# Patient Record
Sex: Female | Born: 1995 | Race: White | Hispanic: No | Marital: Married | Smoking: Never smoker
Health system: Southern US, Community
[De-identification: ages and names within clinical notes are randomized; demographics above are authoritative.]

## PROBLEM LIST (undated history)

## (undated) ENCOUNTER — Inpatient Hospital Stay (HOSPITAL_COMMUNITY): Payer: Self-pay

## (undated) DIAGNOSIS — Z789 Other specified health status: Secondary | ICD-10-CM

## (undated) HISTORY — PX: WISDOM TOOTH EXTRACTION: SHX21

## (undated) HISTORY — PX: NO PAST SURGERIES: SHX2092

---

## 2014-11-15 ENCOUNTER — Ambulatory Visit (INDEPENDENT_AMBULATORY_CARE_PROVIDER_SITE_OTHER): Payer: 59 | Admitting: Family Medicine

## 2014-11-15 ENCOUNTER — Encounter: Payer: Self-pay | Admitting: Family Medicine

## 2014-11-15 VITALS — BP 117/64 | HR 76 | Ht 68.0 in | Wt 177.0 lb

## 2014-11-15 DIAGNOSIS — Z8279 Family history of other congenital malformations, deformations and chromosomal abnormalities: Secondary | ICD-10-CM

## 2014-11-15 DIAGNOSIS — Z113 Encounter for screening for infections with a predominantly sexual mode of transmission: Secondary | ICD-10-CM

## 2014-11-15 DIAGNOSIS — Z3480 Encounter for supervision of other normal pregnancy, unspecified trimester: Secondary | ICD-10-CM | POA: Insufficient documentation

## 2014-11-15 DIAGNOSIS — Z3401 Encounter for supervision of normal first pregnancy, first trimester: Secondary | ICD-10-CM

## 2014-11-15 HISTORY — DX: Family history of other congenital malformations, deformations and chromosomal abnormalities: Z82.79

## 2014-11-15 LAB — HIV ANTIBODY (ROUTINE TESTING W REFLEX): HIV 1&2 Ab, 4th Generation: NONREACTIVE

## 2014-11-15 NOTE — Progress Notes (Signed)
   Subjective:    Sheila Kirby is a G1P0 6863w4d being seen today for her first obstetrical visit.  Her obstetrical history is significant for family history of spina bifida. Patient does intend to breast feed. Pregnancy history fully reviewed.  Patient reports nausea.  Filed Vitals:   11/15/14 1008 11/15/14 1008  BP: 117/64   Pulse: 76   Height:  5\' 8"  (1.727 m)  Weight: 177 lb (80.287 kg)     HISTORY: OB History  Gravida Para Term Preterm AB SAB TAB Ectopic Multiple Living  1             # Outcome Date GA Lbr Len/2nd Weight Sex Delivery Anes PTL Lv  1 Current              History reviewed. No pertinent past medical history. Past Surgical History  Procedure Laterality Date  . No past surgeries     Family History  Problem Relation Age of Onset  . Hypertension Mother   . Diabetes Father   . Cancer Sister   . Cancer Maternal Grandfather     skin  . Cancer Paternal Grandfather     renal  . Stroke Neg Hx   . Miscarriages / Stillbirths Neg Hx   . Mental retardation Neg Hx   . Spina bifida Paternal Uncle      Exam    Uterus:     Pelvic Exam: deferred  System:     Skin: normal coloration and turgor, no rashes    Neurologic: gait normal; reflexes normal and symmetric   Extremities: normal strength, tone, and muscle mass   HEENT PERRLA and extra ocular movement intact   Mouth/Teeth mucous membranes moist, pharynx normal without lesions   Neck supple and no masses   Cardiovascular: regular rate and rhythm, no murmurs or gallops   Respiratory:  appears well, vitals normal, no respiratory distress, acyanotic, normal RR, ear and throat exam is normal, neck free of mass or lymphadenopathy, chest clear, no wheezing, crepitations, rhonchi, normal symmetric air entry   Abdomen: soft, non-tender; bowel sounds normal; no masses,  no organomegaly          Assessment:    Pregnancy: G1P0 Patient Active Problem List   Diagnosis Date Noted  . Supervision of normal first  pregnancy, antepartum 11/15/2014  . Family history of spina bifida 11/15/2014        Plan:     Initial labs drawn. Prenatal vitamins. Problem list reviewed and updated. Discussed screen due to family history of spina bifida Genetic Screening discussed Quad Screen: requested.  Ultrasound discussed; fetal survey: requested.  Follow up in 4 weeks. 50% of 30 min visit spent on counseling and coordination of care.  Water birth information given   Candelaria CelesteSTINSON, Simon Llamas JEHIEL 11/15/2014

## 2014-11-16 LAB — OBSTETRIC PANEL
Antibody Screen: NEGATIVE
Basophils Absolute: 0 10*3/uL (ref 0.0–0.1)
Basophils Relative: 0 % (ref 0–1)
Eosinophils Absolute: 0 10*3/uL (ref 0.0–0.7)
Eosinophils Relative: 0 % (ref 0–5)
HCT: 39.4 % (ref 36.0–46.0)
HEP B S AG: NEGATIVE
Hemoglobin: 13.5 g/dL (ref 12.0–15.0)
LYMPHS PCT: 19 % (ref 12–46)
Lymphs Abs: 1.3 10*3/uL (ref 0.7–4.0)
MCH: 29.2 pg (ref 26.0–34.0)
MCHC: 34.3 g/dL (ref 30.0–36.0)
MCV: 85.3 fL (ref 78.0–100.0)
MONO ABS: 0.5 10*3/uL (ref 0.1–1.0)
MONOS PCT: 7 % (ref 3–12)
MPV: 9.7 fL (ref 8.6–12.4)
NEUTROS ABS: 5.1 10*3/uL (ref 1.7–7.7)
Neutrophils Relative %: 74 % (ref 43–77)
PLATELETS: 257 10*3/uL (ref 150–400)
RBC: 4.62 MIL/uL (ref 3.87–5.11)
RDW: 12.7 % (ref 11.5–15.5)
RH TYPE: POSITIVE
Rubella: 2.38 Index — ABNORMAL HIGH (ref <0.90)
WBC: 6.9 10*3/uL (ref 4.0–10.5)

## 2014-11-16 LAB — GC/CHLAMYDIA PROBE AMP (~~LOC~~) NOT AT ARMC
Chlamydia: NEGATIVE
NEISSERIA GONORRHEA: NEGATIVE

## 2014-11-16 LAB — CULTURE, URINE COMPREHENSIVE
Colony Count: NO GROWTH
Organism ID, Bacteria: NO GROWTH

## 2014-11-19 LAB — CYSTIC FIBROSIS DIAGNOSTIC STUDY

## 2014-12-13 ENCOUNTER — Encounter: Payer: Self-pay | Admitting: Obstetrics & Gynecology

## 2014-12-20 ENCOUNTER — Ambulatory Visit (INDEPENDENT_AMBULATORY_CARE_PROVIDER_SITE_OTHER): Payer: 59 | Admitting: Obstetrics & Gynecology

## 2014-12-20 VITALS — BP 107/53 | HR 77 | Wt 177.0 lb

## 2014-12-20 DIAGNOSIS — Z23 Encounter for immunization: Secondary | ICD-10-CM | POA: Diagnosis not present

## 2014-12-20 DIAGNOSIS — Z8279 Family history of other congenital malformations, deformations and chromosomal abnormalities: Secondary | ICD-10-CM

## 2014-12-20 DIAGNOSIS — Z3402 Encounter for supervision of normal first pregnancy, second trimester: Secondary | ICD-10-CM

## 2014-12-20 NOTE — Patient Instructions (Signed)
Return to clinic for any obstetric concerns or go to MAU for evaluation  

## 2014-12-20 NOTE — Progress Notes (Signed)
Subjective:  Sheila Kirby is a 19 y.o. G1P0 at 9030w4d being seen today for ongoing prenatal care.  Sheila Kirby is currently monitored for the following issues for this low-risk pregnancy and has Supervision of normal first pregnancy, antepartum and Family history of spina bifida on her problem list.  Patient reports no complaints.   . Vag. Bleeding: None.   . Denies leaking of fluid.   The following portions of the patient's history were reviewed and updated as appropriate: allergies, current medications, past family history, past medical history, past social history, past surgical history and problem list. Problem list updated.  Objective:   Filed Vitals:   12/20/14 0924  BP: 107/53  Pulse: 77  Weight: 177 lb (80.287 kg)    Fetal Status: Fetal Heart Rate (bpm): 154         General:  Alert, oriented and cooperative. Patient is in no acute distress.  Skin: Skin is warm and dry. No rash noted.   Cardiovascular: Normal heart rate noted  Respiratory: Normal respiratory effort, no problems with respiration noted  Abdomen: Soft, gravid, appropriate for gestational age. Pain/Pressure: Absent     Pelvic: Vag. Bleeding: None Vag D/C Character: Thin   Cervical exam deferred        Extremities: Normal range of motion.  Edema: None  Mental Status: Normal mood and affect. Normal behavior. Normal judgment and thought content.   Urinalysis: Urine Protein: Negative Urine Glucose: Negative  Assessment and Plan:  Pregnancy: G1P0 at 5030w4d  1. Supervision of normal first pregnancy, antepartum, second trimester 2. Family history of spina bifida Declines quad screen today, informed Sheila Kirby has up to 8761w6d to do this screen if desired. Anatomy scan ordered - US MFM OB COMP + 14 WK; Future  3. Flu vaccine need - Flu Vaccine QUAD 36+ mos IM (Fluarix, Quad PF)  Routine obstetric precautions reviewed. Please refer to After Visit Summary for other counseling recommendations.  Return in about 4 weeks (around  01/17/2015) for OB Visit.   Tereso NewcomerUgonna A Burr Soffer, MD

## 2015-01-02 NOTE — L&D Delivery Note (Signed)
Patient is 20 y.o. G1P0 6390w0d admitted for IOL for PD   Delivery Note At 10:53 AM a viable female was delivered via Vaginal, Spontaneous Delivery (Presentation: ; Occiput Anterior).  APGAR: 9, 9; weight  pending.   Placenta status: Intact, Spontaneous.  Cord: 3 vessels with the following complications: single knot.  Cord pH: n/a  Anesthesia: Epidural  Episiotomy: None Lacerations: Vaginal (shallow) without perineal involvment Suture Repair: 3.0 vicryl (figure of 8) Est. Blood Loss (mL): 250  Mom to postpartum.  Baby to Couplet care / Skin to Skin.  Almon Herculesaye T Winnie Barsky 06/23/2015, 11:52 AM

## 2015-01-17 ENCOUNTER — Other Ambulatory Visit: Payer: Self-pay | Admitting: Obstetrics & Gynecology

## 2015-01-17 ENCOUNTER — Ambulatory Visit (HOSPITAL_COMMUNITY)
Admission: RE | Admit: 2015-01-17 | Discharge: 2015-01-17 | Disposition: A | Discharge status: Home / Self Care | Payer: 59 | Source: Ambulatory Visit | Attending: Obstetrics & Gynecology | Admitting: Obstetrics & Gynecology

## 2015-01-17 DIAGNOSIS — Z3689 Encounter for other specified antenatal screening: Secondary | ICD-10-CM

## 2015-01-17 DIAGNOSIS — Z3A18 18 weeks gestation of pregnancy: Secondary | ICD-10-CM

## 2015-01-17 DIAGNOSIS — Z36 Encounter for antenatal screening of mother: Secondary | ICD-10-CM | POA: Diagnosis not present

## 2015-01-17 DIAGNOSIS — Z3402 Encounter for supervision of normal first pregnancy, second trimester: Secondary | ICD-10-CM

## 2015-01-17 DIAGNOSIS — Z8279 Family history of other congenital malformations, deformations and chromosomal abnormalities: Secondary | ICD-10-CM | POA: Diagnosis not present

## 2015-01-19 ENCOUNTER — Ambulatory Visit (INDEPENDENT_AMBULATORY_CARE_PROVIDER_SITE_OTHER): Payer: 59 | Admitting: Family Medicine

## 2015-01-19 VITALS — BP 109/63 | HR 75 | Wt 176.0 lb

## 2015-01-19 DIAGNOSIS — Z3402 Encounter for supervision of normal first pregnancy, second trimester: Secondary | ICD-10-CM

## 2015-01-19 NOTE — Patient Instructions (Signed)

## 2015-01-19 NOTE — Progress Notes (Signed)
Subjective:  Sheila Kirby is a 20 y.o. G1P0 at [redacted]w[redacted]d being seen today for ongoing prenatal care.  She is currently monitored for the following issues for this low-risk pregnancy and has Supervision of normal first pregnancy, antepartum and Family history of spina bifida on her problem list.  Patient reports no complaints.  Contractions: Not present. Vag. Bleeding: None.  Movement: Present. Denies leaking of fluid.   The following portions of the patient's history were reviewed and updated as appropriate: allergies, current medications, past family history, past medical history, past social history, past surgical history and problem list. Problem list updated.  Objective:   Filed Vitals:   01/19/15 1323  BP: 109/63  Pulse: 75  Weight: 176 lb (79.833 kg)    Fetal Status: Fetal Heart Rate (bpm): 141   Movement: Present     General:  Alert, oriented and cooperative. Patient is in no acute distress.  Skin: Skin is warm and dry. No rash noted.   Cardiovascular: Normal heart rate noted  Respiratory: Normal respiratory effort, no problems with respiration noted  Abdomen: Soft, gravid, appropriate for gestational age. Pain/Pressure: Absent     Pelvic: Vag. Bleeding: None Vag D/C Character: Thin   Cervical exam deferred        Extremities: Normal range of motion.  Edema: None  Mental Status: Normal mood and affect. Normal behavior. Normal judgment and thought content.   Urinalysis: Urine Protein: Negative Urine Glucose: Negative  Assessment and Plan:  Pregnancy: G1P0 at [redacted]w[redacted]d  1. Supervision of normal first pregnancy, antepartum, second trimester [place] appears normal.  No evidence of neural tube defects.  HR, FH normal.  Preterm labor symptoms and general obstetric precautions including but not limited to vaginal bleeding, contractions, leaking of fluid and fetal movement were reviewed in detail with the patient. Please refer to After Visit Summary for other counseling recommendations.   Return in about 4 weeks (around 02/16/2015) for OB.   [name], DO

## 2015-02-24 ENCOUNTER — Ambulatory Visit (INDEPENDENT_AMBULATORY_CARE_PROVIDER_SITE_OTHER): Payer: 59 | Admitting: Family Medicine

## 2015-02-24 VITALS — BP 118/60 | HR 80 | Wt 180.0 lb

## 2015-02-24 DIAGNOSIS — Z3402 Encounter for supervision of normal first pregnancy, second trimester: Secondary | ICD-10-CM

## 2015-02-24 NOTE — Progress Notes (Signed)
Subjective:  Sheila Kirby is a 20 y.o. G1P0 at [redacted]w[redacted]d being seen today for ongoing prenatal care.  She is currently monitored for the following issues for this low-risk pregnancy and has Supervision of normal first pregnancy, antepartum and Family history of spina bifida on her problem list.  Patient reports no complaints.  Contractions: Not present. Vag. Bleeding: None.  Movement: Present. Denies leaking of fluid.   The following portions of the patient's history were reviewed and updated as appropriate: allergies, current medications, past family history, past medical history, past social history, past surgical history and problem list. Problem list updated.  Objective:   Filed Vitals:   02/24/15 0842  BP: 118/60  Pulse: 80  Weight: 180 lb (81.647 kg)    Fetal Status:     Movement: Present     General:  Alert, oriented and cooperative. Patient is in no acute distress.  Skin: Skin is warm and dry. No rash noted.   Cardiovascular: Normal heart rate noted  Respiratory: Normal respiratory effort, no problems with respiration noted  Abdomen: Soft, gravid, appropriate for gestational age. Pain/Pressure: Absent     Pelvic: Vag. Bleeding: None Vag D/C Character: Thin   Cervical exam deferred        Extremities: Normal range of motion.  Edema: None  Mental Status: Normal mood and affect. Normal behavior. Normal judgment and thought content.   Urinalysis:      Assessment and Plan:  Pregnancy: G1P0 at [redacted]w[redacted]d  1. Supervision of normal first pregnancy, antepartum, second trimester FHT normal, FH normal.  28 week labs next visit  Preterm labor symptoms and general obstetric precautions including but not limited to vaginal bleeding, contractions, leaking of fluid and fetal movement were reviewed in detail with the patient. Please refer to After Visit Summary for other counseling recommendations.  Return in about 4 weeks (around 03/24/2015) for OB f/u.   [name], DO

## 2015-02-24 NOTE — Patient Instructions (Signed)

## 2015-03-31 ENCOUNTER — Encounter: Payer: Self-pay | Admitting: Obstetrics & Gynecology

## 2015-03-31 ENCOUNTER — Ambulatory Visit (INDEPENDENT_AMBULATORY_CARE_PROVIDER_SITE_OTHER): Payer: 59 | Admitting: Obstetrics & Gynecology

## 2015-03-31 VITALS — BP 126/77 | HR 65 | Wt 186.0 lb

## 2015-03-31 DIAGNOSIS — Z23 Encounter for immunization: Secondary | ICD-10-CM

## 2015-03-31 DIAGNOSIS — Z3490 Encounter for supervision of normal pregnancy, unspecified, unspecified trimester: Secondary | ICD-10-CM

## 2015-03-31 DIAGNOSIS — Z36 Encounter for antenatal screening of mother: Secondary | ICD-10-CM | POA: Diagnosis not present

## 2015-03-31 DIAGNOSIS — Z3480 Encounter for supervision of other normal pregnancy, unspecified trimester: Secondary | ICD-10-CM | POA: Diagnosis not present

## 2015-03-31 DIAGNOSIS — Z3403 Encounter for supervision of normal first pregnancy, third trimester: Secondary | ICD-10-CM

## 2015-03-31 LAB — CBC
HCT: 34.4 % — ABNORMAL LOW (ref 36.0–46.0)
Hemoglobin: 11.6 g/dL — ABNORMAL LOW (ref 12.0–15.0)
MCH: 29.5 pg (ref 26.0–34.0)
MCHC: 33.7 g/dL (ref 30.0–36.0)
MCV: 87.5 fL (ref 78.0–100.0)
MPV: 9.9 fL (ref 8.6–12.4)
Platelets: 213 10*3/uL (ref 150–400)
RBC: 3.93 MIL/uL (ref 3.87–5.11)
RDW: 13 % (ref 11.5–15.5)
WBC: 8.8 10*3/uL (ref 4.0–10.5)

## 2015-03-31 NOTE — Progress Notes (Signed)
Subjective:  Sheila HonourMacie Kirby is a 20 y.o. G1P0 (daughter) at 4576w0d being seen today for ongoing prenatal care.  She is currently monitored for the following issues for this low-risk pregnancy and has Supervision of normal first pregnancy, antepartum and Family history of spina bifida on her problem list.  Patient reports no complaints.  Contractions: Not present. Vag. Bleeding: None.  Movement: Present. Denies leaking of fluid.   The following portions of the patient's history were reviewed and updated as appropriate: allergies, current medications, past family history, past medical history, past social history, past surgical history and problem list. Problem list updated.  Objective:   Filed Vitals:   03/31/15 0910  BP: 126/77  Pulse: 65  Weight: 186 lb (84.369 kg)    Fetal Status: Fetal Heart Rate (bpm): 138   Movement: Present     General:  Alert, oriented and cooperative. Patient is in no acute distress.  Skin: Skin is warm and dry. No rash noted.   Cardiovascular: Normal heart rate noted  Respiratory: Normal respiratory effort, no problems with respiration noted  Abdomen: Soft, gravid, appropriate for gestational age. Pain/Pressure: Absent     Pelvic: Vag. Bleeding: None Vag D/C Character: Thin   Cervical exam deferred        Extremities: Normal range of motion.  Edema: None  Mental Status: Normal mood and affect. Normal behavior. Normal judgment and thought content.   Urinalysis: Urine Protein: Negative Urine Glucose: Negative  Assessment and Plan:  Pregnancy: G1P0 at 6776w0d  1. Prenatal care, unspecified trimester  - CBC - Glucose Tolerance, 1 HR (50g) - RPR - HIV antibody (with reflex)  Preterm labor symptoms and general obstetric precautions including but not limited to vaginal bleeding, contractions, leaking of fluid and fetal movement were reviewed in detail with the patient. Please refer to After Visit Summary for other counseling recommendations.  Return in about 3  weeks (around 04/21/2015).   Allie BossierMyra C Olina Melfi, MD

## 2015-04-01 LAB — HIV ANTIBODY (ROUTINE TESTING W REFLEX): HIV: NONREACTIVE

## 2015-04-01 LAB — GLUCOSE TOLERANCE, 1 HOUR (50G) W/O FASTING: GLUCOSE, 1 HR, GESTATIONAL: 100 mg/dL (ref <140)

## 2015-04-01 LAB — RPR

## 2015-04-20 ENCOUNTER — Ambulatory Visit (INDEPENDENT_AMBULATORY_CARE_PROVIDER_SITE_OTHER): Payer: 59 | Admitting: Family Medicine

## 2015-04-20 VITALS — BP 116/53 | HR 79 | Wt 187.0 lb

## 2015-04-20 DIAGNOSIS — Z3403 Encounter for supervision of normal first pregnancy, third trimester: Secondary | ICD-10-CM

## 2015-04-20 NOTE — Patient Instructions (Signed)

## 2015-04-20 NOTE — Progress Notes (Signed)
Subjective:  Sheila Kirby is a 20 y.o. G1P0 at 5768w6d being seen today for ongoing prenatal care.  She is currently monitored for the following issues for this low-risk pregnancy and has Supervision of normal first pregnancy, antepartum and Family history of spina bifida on her problem list.  Patient reports no complaints.  Contractions: Not present. Vag. Bleeding: None.  Movement: Present. Denies leaking of fluid.   The following portions of the patient's history were reviewed and updated as appropriate: allergies, current medications, past family history, past medical history, past social history, past surgical history and problem list. Problem list updated.  Objective:   Filed Vitals:   04/20/15 1505  BP: 116/53  Pulse: 79  Weight: 187 lb (84.823 kg)    Fetal Status: Fetal Heart Rate (bpm): 130   Movement: Present     General:  Alert, oriented and cooperative. Patient is in no acute distress.  Skin: Skin is warm and dry. No rash noted.   Cardiovascular: Normal heart rate noted  Respiratory: Normal respiratory effort, no problems with respiration noted  Abdomen: Soft, gravid, appropriate for gestational age. Pain/Pressure: Absent     Pelvic: Vag. Bleeding: None Vag D/C Character: Thin   Cervical exam deferred        Extremities: Normal range of motion.  Edema: None  Mental Status: Normal mood and affect. Normal behavior. Normal judgment and thought content.   Urinalysis:      Assessment and Plan:  Pregnancy: G1P0 at 7268w6d  1. Supervision of normal first pregnancy, antepartum, third trimester FHT and FH normal  There are no diagnoses linked to this encounter. Preterm labor symptoms and general obstetric precautions including but not limited to vaginal bleeding, contractions, leaking of fluid and fetal movement were reviewed in detail with the patient. Please refer to After Visit Summary for other counseling recommendations.  Return in about 2 weeks (around 05/04/2015) for OB  f/u.   Levie HeritageJacob J Lorie Melichar, DO

## 2015-05-05 ENCOUNTER — Ambulatory Visit (INDEPENDENT_AMBULATORY_CARE_PROVIDER_SITE_OTHER): Payer: 59 | Admitting: Family Medicine

## 2015-05-05 VITALS — BP 113/62 | HR 78 | Wt 189.0 lb

## 2015-05-05 DIAGNOSIS — Z3403 Encounter for supervision of normal first pregnancy, third trimester: Secondary | ICD-10-CM

## 2015-05-05 NOTE — Patient Instructions (Signed)

## 2015-05-05 NOTE — Progress Notes (Signed)
Subjective:  Sheila Kirby is a 20 y.o. G1P0 at 335w0d being seen today for ongoing prenatal care.  She is currently monitored for the following issues for this low-risk pregnancy and has Supervision of normal first pregnancy, antepartum and Family history of spina bifida on her problem list.  Patient reports heartburn controlled by Zantac.  Contractions: Not present. Vag. Bleeding: None.  Movement: Present. Denies leaking of fluid.   The following portions of the patient's history were reviewed and updated as appropriate: allergies, current medications, past family history, past medical history, past social history, past surgical history and problem list. Problem list updated.  Objective:   Filed Vitals:   05/05/15 0845  BP: 113/62  Pulse: 78  Weight: 189 lb (85.73 kg)    Fetal Status: Fetal Heart Rate (bpm): 138   Movement: Present     General:  Alert, oriented and cooperative. Patient is in no acute distress.  Skin: Skin is warm and dry. No rash noted.   Cardiovascular: Normal heart rate noted  Respiratory: Normal respiratory effort, no problems with respiration noted  Abdomen: Soft, gravid, appropriate for gestational age. Pain/Pressure: Absent     Pelvic: Vag. Bleeding: None Vag D/C Character: Thin   Cervical exam deferred        Extremities: Normal range of motion.  Edema: None  Mental Status: Normal mood and affect. Normal behavior. Normal judgment and thought content.   Urinalysis: Urine Protein: Negative Urine Glucose: Negative  Assessment and Plan:  Pregnancy: G1P0 at 355w0d  1. Supervision of normal first pregnancy, antepartum, third trimester FHT and FH normal  Preterm labor symptoms and general obstetric precautions including but not limited to vaginal bleeding, contractions, leaking of fluid and fetal movement were reviewed in detail with the patient. Please refer to After Visit Summary for other counseling recommendations.  Return in about 2 weeks (around 05/19/2015)  for OB f/u.   Levie HeritageJacob J Stinson, DO

## 2015-05-14 ENCOUNTER — Inpatient Hospital Stay (HOSPITAL_COMMUNITY)
Admission: AD | Admit: 2015-05-14 | Discharge: 2015-05-14 | Disposition: A | Discharge status: Home / Self Care | Payer: 59 | Source: Ambulatory Visit | Attending: Obstetrics & Gynecology | Admitting: Obstetrics & Gynecology

## 2015-05-14 ENCOUNTER — Encounter (HOSPITAL_COMMUNITY): Payer: Self-pay | Admitting: Certified Nurse Midwife

## 2015-05-14 DIAGNOSIS — O9989 Other specified diseases and conditions complicating pregnancy, childbirth and the puerperium: Secondary | ICD-10-CM | POA: Diagnosis not present

## 2015-05-14 DIAGNOSIS — Z880 Allergy status to penicillin: Secondary | ICD-10-CM | POA: Insufficient documentation

## 2015-05-14 DIAGNOSIS — O4703 False labor before 37 completed weeks of gestation, third trimester: Secondary | ICD-10-CM | POA: Diagnosis not present

## 2015-05-14 DIAGNOSIS — Z3A35 35 weeks gestation of pregnancy: Secondary | ICD-10-CM | POA: Insufficient documentation

## 2015-05-14 DIAGNOSIS — R109 Unspecified abdominal pain: Secondary | ICD-10-CM | POA: Diagnosis not present

## 2015-05-14 DIAGNOSIS — O26899 Other specified pregnancy related conditions, unspecified trimester: Secondary | ICD-10-CM

## 2015-05-14 DIAGNOSIS — O26893 Other specified pregnancy related conditions, third trimester: Secondary | ICD-10-CM | POA: Insufficient documentation

## 2015-05-14 HISTORY — DX: Other specified health status: Z78.9

## 2015-05-14 LAB — URINALYSIS, ROUTINE W REFLEX MICROSCOPIC
Bilirubin Urine: NEGATIVE
Glucose, UA: NEGATIVE mg/dL
Hgb urine dipstick: NEGATIVE
Ketones, ur: NEGATIVE mg/dL
LEUKOCYTES UA: NEGATIVE
Nitrite: NEGATIVE
PH: 6.5 (ref 5.0–8.0)
Protein, ur: NEGATIVE mg/dL
SPECIFIC GRAVITY, URINE: 1.015 (ref 1.005–1.030)

## 2015-05-14 LAB — WET PREP, GENITAL
CLUE CELLS WET PREP: NONE SEEN
SPERM: NONE SEEN
TRICH WET PREP: NONE SEEN
Yeast Wet Prep HPF POC: NONE SEEN

## 2015-05-14 NOTE — MAU Provider Note (Signed)
History  Chief Complaint:  Contractions  Sheila Kirby is a 20 y.o. G1P0 female at 7388w2d presenting w/ report of frequent uc's beginning last night, stronger and closer than her normal braxton hicks. Kept her up until 0500. Went to work this am, uc's have spaced, and lessened in intensity, but still wanted to get checked out.   Reports active fetal movement, contractions: irregular now, vaginal bleeding: none, membranes: intact. Has had more discharge, different than her normal, although denies odor/itching/irritation.   [facility]  Next visit Monday. Pregnancy complicated by nothing  Obstetrical History: OB History    Gravida Para Term Preterm AB TAB SAB Ectopic Multiple Living   1               Past Medical History: Past Medical History  Diagnosis Date  . Medical history non-contributory     Past Surgical History: Past Surgical History  Procedure Laterality Date  . No past surgeries      Social History: Social History   Social History  . Marital Status: Single    Spouse Name: N/A  . Number of Children: N/A  . Years of Education: N/A   Social History Main Topics  . Smoking status: Never Smoker   . Smokeless tobacco: None  . Alcohol Use: No  . Drug Use: No  . Sexual Activity: Yes   Other Topics Concern  . None   Social History Narrative    Allergies: Allergies  Allergen Reactions  . Amoxicillin Rash    No prescriptions prior to admission    Review of Systems  Pertinent pos/neg as indicated in HPI  Physical Exam  Blood pressure 123/74, pulse 104, temperature 97.8 F (36.6 C), temperature source Oral, resp. rate 20, height 5\' 9"  (1.753 m), weight 85.821 kg (189 lb 3.2 oz). General appearance: alert, cooperative and no distress Lungs: clear to auscultation bilaterally, normal effort Heart: regular rate and rhythm Abdomen: gravid, soft, non-tender Extremities: trace edema DTR's 2+  Spec exam: cx visually closed, scant amt thin white  nonodorous d/c Cultures/Specimens: wet prep, gc/ct  SVE: 1.5/50/-2, vtx Fetal monitoring: FHR: 145 bpm, variability: moderate,  Accelerations: Present,  decelerations:  Absent Uterine activity: none  MAU Course  UA NST Spec exam w/ cultures SVE  Labs:  Results for orders placed or performed during the hospital encounter of 05/14/15 (from the past 24 hour(s))  Urinalysis, Routine w reflex microscopic (not at [facility])     Status: None   Collection Time: 05/14/15  1:44 PM  Result Value Ref Range   Color, Urine YELLOW YELLOW   APPearance CLEAR CLEAR   Specific Gravity, Urine 1.015 1.005 - 1.030   pH 6.5 5.0 - 8.0   Glucose, UA NEGATIVE NEGATIVE mg/dL   Hgb urine dipstick NEGATIVE NEGATIVE   Bilirubin Urine NEGATIVE NEGATIVE   Ketones, ur NEGATIVE NEGATIVE mg/dL   Protein, ur NEGATIVE NEGATIVE mg/dL   Nitrite NEGATIVE NEGATIVE   Leukocytes, UA NEGATIVE NEGATIVE  Wet prep, genital     Status: Abnormal   Collection Time: 05/14/15  2:22 PM  Result Value Ref Range   Yeast Wet Prep HPF POC NONE SEEN NONE SEEN   Trich, Wet Prep NONE SEEN NONE SEEN   Clue Cells Wet Prep HPF POC NONE SEEN NONE SEEN   WBC, Wet Prep HPF POC MANY (A) NONE SEEN   Sperm NONE SEEN     Imaging:  n/a  Assessment and Plan  A:  3288w2d SIUP  G1P0  Cramping during pregnancy  Cat 1 FHR P:  D/C home  Increase po fluids  GC/CT pending  Reviewed ptl s/s, fkc  Keep next appt at [facility] clinic on Mon as scheduled   Marge Duncans CNM,WHNP-BC 5/13/20173:09 PM

## 2015-05-14 NOTE — Discharge Instructions (Signed)
Fetal Movement Counts °Patient Name: __________________________________________________ Patient Due Date: ____________________ °Performing a fetal movement count is highly recommended in high-risk pregnancies, but it is good for every pregnant woman to do. Your health care provider may ask you to start counting fetal movements at 28 weeks of the pregnancy. Fetal movements often increase: °· After eating a full meal. °· After physical activity. °· After eating or drinking something sweet or cold. °· At rest. °Pay attention to when you feel the baby is most active. This will help you notice a pattern of your baby's sleep and wake cycles and what factors contribute to an increase in fetal movement. It is important to perform a fetal movement count at the same time each day when your baby is normally most active.  °HOW TO COUNT FETAL MOVEMENTS °1. Find a quiet and comfortable area to sit or lie down on your left side. Lying on your left side provides the best blood and oxygen circulation to your baby. °2. Write down the day and time on a sheet of paper or in a journal. °3. Start counting kicks, flutters, swishes, rolls, or jabs in a 2-hour period. You should feel at least 10 movements within 2 hours. °4. If you do not feel 10 movements in 2 hours, wait 2-3 hours and count again. Look for a change in the pattern or not enough counts in 2 hours. °SEEK MEDICAL CARE IF: °· You feel less than 10 counts in 2 hours, tried twice. °· There is no movement in over an hour. °· The pattern is changing or taking longer each day to reach 10 counts in 2 hours. °· You feel the baby is not moving as he or she usually does. °Date: ____________ Movements: ____________ Start time: ____________ Finish time: ____________  °Date: ____________ Movements: ____________ Start time: ____________ Finish time: ____________ °Date: ____________ Movements: ____________ Start time: ____________ Finish time: ____________ °Date: ____________ Movements:  ____________ Start time: ____________ Finish time: ____________ °Date: ____________ Movements: ____________ Start time: ____________ Finish time: ____________ °Date: ____________ Movements: ____________ Start time: ____________ Finish time: ____________ °Date: ____________ Movements: ____________ Start time: ____________ Finish time: ____________ °Date: ____________ Movements: ____________ Start time: ____________ Finish time: ____________  °Date: ____________ Movements: ____________ Start time: ____________ Finish time: ____________ °Date: ____________ Movements: ____________ Start time: ____________ Finish time: ____________ °Date: ____________ Movements: ____________ Start time: ____________ Finish time: ____________ °Date: ____________ Movements: ____________ Start time: ____________ Finish time: ____________ °Date: ____________ Movements: ____________ Start time: ____________ Finish time: ____________ °Date: ____________ Movements: ____________ Start time: ____________ Finish time: ____________ °Date: ____________ Movements: ____________ Start time: ____________ Finish time: ____________  °Date: ____________ Movements: ____________ Start time: ____________ Finish time: ____________ °Date: ____________ Movements: ____________ Start time: ____________ Finish time: ____________ °Date: ____________ Movements: ____________ Start time: ____________ Finish time: ____________ °Date: ____________ Movements: ____________ Start time: ____________ Finish time: ____________ °Date: ____________ Movements: ____________ Start time: ____________ Finish time: ____________ °Date: ____________ Movements: ____________ Start time: ____________ Finish time: ____________ °Date: ____________ Movements: ____________ Start time: ____________ Finish time: ____________  °Date: ____________ Movements: ____________ Start time: ____________ Finish time: ____________ °Date: ____________ Movements: ____________ Start time: ____________ Finish  time: ____________ °Date: ____________ Movements: ____________ Start time: ____________ Finish time: ____________ °Date: ____________ Movements: ____________ Start time: ____________ Finish time: ____________ °Date: ____________ Movements: ____________ Start time: ____________ Finish time: ____________ °Date: ____________ Movements: ____________ Start time: ____________ Finish time: ____________ °Date: ____________ Movements: ____________ Start time: ____________ Finish time: ____________  °Date: ____________ Movements: ____________ Start time: ____________ Finish   time: ____________ °Date: ____________ Movements: ____________ Start time: ____________ Finish time: ____________ °Date: ____________ Movements: ____________ Start time: ____________ Finish time: ____________ °Date: ____________ Movements: ____________ Start time: ____________ Finish time: ____________ °Date: ____________ Movements: ____________ Start time: ____________ Finish time: ____________ °Date: ____________ Movements: ____________ Start time: ____________ Finish time: ____________ °Date: ____________ Movements: ____________ Start time: ____________ Finish time: ____________  °Date: ____________ Movements: ____________ Start time: ____________ Finish time: ____________ °Date: ____________ Movements: ____________ Start time: ____________ Finish time: ____________ °Date: ____________ Movements: ____________ Start time: ____________ Finish time: ____________ °Date: ____________ Movements: ____________ Start time: ____________ Finish time: ____________ °Date: ____________ Movements: ____________ Start time: ____________ Finish time: ____________ °Date: ____________ Movements: ____________ Start time: ____________ Finish time: ____________ °Date: ____________ Movements: ____________ Start time: ____________ Finish time: ____________  °Date: ____________ Movements: ____________ Start time: ____________ Finish time: ____________ °Date: ____________  Movements: ____________ Start time: ____________ Finish time: ____________ °Date: ____________ Movements: ____________ Start time: ____________ Finish time: ____________ °Date: ____________ Movements: ____________ Start time: ____________ Finish time: ____________ °Date: ____________ Movements: ____________ Start time: ____________ Finish time: ____________ °Date: ____________ Movements: ____________ Start time: ____________ Finish time: ____________ °Date: ____________ Movements: ____________ Start time: ____________ Finish time: ____________  °Date: ____________ Movements: ____________ Start time: ____________ Finish time: ____________ °Date: ____________ Movements: ____________ Start time: ____________ Finish time: ____________ °Date: ____________ Movements: ____________ Start time: ____________ Finish time: ____________ °Date: ____________ Movements: ____________ Start time: ____________ Finish time: ____________ °Date: ____________ Movements: ____________ Start time: ____________ Finish time: ____________ °Date: ____________ Movements: ____________ Start time: ____________ Finish time: ____________ °  °This information is not intended to replace advice given to you by your health care provider. Make sure you discuss any questions you have with your health care provider. °  °Document Released: 01/17/2006 Document Revised: 01/08/2014 Document Reviewed: 10/15/2011 °Elsevier Interactive Patient Education ©2016 [employer] °Vaginal Delivery °During delivery, your health care provider will help you give birth to your baby. During a vaginal delivery, you will work to push the baby out of your vagina. However, before you can push your baby out, a few things need to happen. The opening of your uterus (cervix) has to soften, thin out, and open up (dilate) all the way to 10 cm. Also, your baby has to move down from the uterus into your vagina.  °SIGNS OF LABOR  °Your health care provider will first need to make sure you  are in labor. Signs of labor include:  °· Passing what is called the mucous plug before labor begins. This is a small amount of blood-stained mucus. °· Having regular, painful uterine contractions.   °· The time between contractions gets shorter.   °· The discomfort and pain gradually get more intense. °· Contraction pains get worse when walking and do not go away when resting.   °· Your cervix becomes thinner (effacement) and dilates. °BEFORE THE DELIVERY °Once you are in labor and admitted into the hospital or care center, your health care provider may do the following:  °5. Perform a complete physical exam. °6. Review any complications related to pregnancy or labor.  °7. Check your blood pressure, pulse, temperature, and heart rate (vital signs).   °8. Determine if, and when, the rupture of amniotic membranes occurred. °9. Do a vaginal exam (using a sterile glove and lubricant) to determine:   °1. The position (presentation) of the baby. Is the baby's head presenting first (vertex) in the birth canal (vagina), or are the feet or buttocks first (breech)?   °2. The level (station) of the baby's head within   the birth canal.   °3. The effacement and dilatation of the cervix.   °10. An electronic fetal monitor is usually placed on your abdomen when you first arrive. This is used to monitor your contractions and the baby's heart rate. °1. When the monitor is on your abdomen (external fetal monitor), it can only pick up the frequency and length of your contractions. It cannot tell the strength of your contractions. °2. If it becomes necessary for your health care provider to know exactly how strong your contractions are or to see exactly what the baby's heart rate is doing, an internal monitor may be inserted into your vagina and uterus. Your health care provider will discuss the benefits and risks of using an internal monitor and obtain your permission before inserting the device. °3. Continuous fetal monitoring may be  needed if you have an epidural, are receiving certain medicines (such as oxytocin), or have pregnancy or labor complications. °11. An IV access tube may be placed into a vein in your arm to deliver fluids and medicines if necessary. °THREE STAGES OF LABOR AND DELIVERY °Normal labor and delivery is divided into three stages. °First Stage °This stage starts when you begin to contract regularly and your cervix begins to efface and dilate. It ends when your cervix is completely open (fully dilated). The first stage is the longest stage of labor and can last from 3 hours to 15 hours.  °Several methods are available to help with labor pain. You and your health care provider will decide which option is best for you. Options include:  °· Opioid medicines. These are strong pain medicines that you can get through your IV tube or as a shot into your muscle. These medicines lessen pain but do not make it go away completely.  °· Epidural. A medicine is given through a thin tube that is inserted in your back. The medicine numbs the lower part of your body and prevents any pain in that area. °· Paracervical pain medicine. This is an injection of an anesthetic on each side of your cervix.   °· You may request natural childbirth, which does not involve the use of pain medicines or an epidural during labor and delivery. Instead, you will use other things, such as breathing exercises, to help cope with the pain. °Second Stage °The second stage of labor begins when your cervix is fully dilated at 10 cm. It continues until you push your baby down through the birth canal and the baby is born. This stage can take only minutes or several hours. °· The location of your baby's head as it moves through the birth canal is reported as a number called a station. If the baby's head has not started its descent, the station is described as being at minus 3 (-3). When your baby's head is at the zero station, it is at the middle of the birth canal  and is engaged in the pelvis. The station of your baby helps indicate the progress of the second stage of labor. °· When your baby is born, your health care provider may hold the baby with his or her head lowered to prevent amniotic fluid, mucus, and blood from getting into the baby's lungs. The baby's mouth and nose may be suctioned with a small bulb syringe to remove any additional fluid. °· Your health care provider may then place the baby on your stomach. It is important to keep the baby from getting cold. To do this, the health care provider will dry   the baby off, place the baby directly on your skin (with no blankets between you and the baby), and cover the baby with warm, dry blankets.   °· The umbilical cord is cut. °Third Stage °During the third stage of labor, your health care provider will deliver the placenta (afterbirth) and make sure your bleeding is under control. The delivery of the placenta usually takes about 5 minutes but can take up to 30 minutes. After the placenta is delivered, a medicine may be given either by IV or injection to help contract the uterus and control bleeding. If you are planning to breastfeed, you can try to do so now. °After you deliver the placenta, your uterus should contract and get very firm. If your uterus does not remain firm, your health care provider will massage it. This is important because the contraction of the uterus helps cut off bleeding at the site where the placenta was attached to your uterus. If your uterus does not contract properly and stay firm, you may continue to bleed heavily. If there is a lot of bleeding, medicines may be given to contract the uterus and stop the bleeding.  °  °This information is not intended to replace advice given to you by your health care provider. Make sure you discuss any questions you have with your health care provider. °  °Document Released: 09/27/2007 Document Revised: 01/08/2014 Document Reviewed: 08/15/2011 °Elsevier  Interactive Patient Education ©2016 [employer] ° °

## 2015-05-14 NOTE — MAU Note (Signed)
Pt states she had contractions from midnight to 5AM. She was finally able to fall asleep at [facility] but today the ctxs returned and are painful. Pt denies LOF or vaginal bleeding but states there is an increase in vaginal discharge. Pt states the baby hasn't been moving much today.

## 2015-05-16 ENCOUNTER — Ambulatory Visit (INDEPENDENT_AMBULATORY_CARE_PROVIDER_SITE_OTHER): Payer: 59 | Admitting: Obstetrics & Gynecology

## 2015-05-16 ENCOUNTER — Encounter (HOSPITAL_COMMUNITY): Payer: Self-pay

## 2015-05-16 VITALS — BP 101/57 | HR 88 | Wt 189.0 lb

## 2015-05-16 DIAGNOSIS — Z36 Encounter for antenatal screening of mother: Secondary | ICD-10-CM

## 2015-05-16 DIAGNOSIS — Z3400 Encounter for supervision of normal first pregnancy, unspecified trimester: Secondary | ICD-10-CM | POA: Diagnosis not present

## 2015-05-16 DIAGNOSIS — Z880 Allergy status to penicillin: Secondary | ICD-10-CM

## 2015-05-16 DIAGNOSIS — Z113 Encounter for screening for infections with a predominantly sexual mode of transmission: Secondary | ICD-10-CM | POA: Diagnosis not present

## 2015-05-16 DIAGNOSIS — Z3403 Encounter for supervision of normal first pregnancy, third trimester: Secondary | ICD-10-CM | POA: Diagnosis not present

## 2015-05-16 LAB — OB RESULTS CONSOLE GC/CHLAMYDIA: GC PROBE AMP, GENITAL: NEGATIVE

## 2015-05-16 LAB — OB RESULTS CONSOLE GBS: GBS: POSITIVE

## 2015-05-16 NOTE — Progress Notes (Signed)
Subjective:  Sheila Kirby is a 20 y.o. G1P0 at 5418w4d being seen today for ongoing prenatal care.  Sheila Kirby is currently monitored for the following issues for this low-risk pregnancy and has Supervision of normal first pregnancy, antepartum and Family history of spina bifida on her problem list.  Patient reports occasional contractions. Was seen in MAU on 05/14/15 and ruled out for PTL.  Contractions: Irregular. Vag. Bleeding: None.  Movement: Present. Denies leaking of fluid.   The following portions of the patient's history were reviewed and updated as appropriate: allergies, current medications, past family history, past medical history, past social history, past surgical history and problem list. Problem list updated.  Objective:   Filed Vitals:   05/16/15 0912  BP: 101/57  Pulse: 88  Weight: 189 lb (85.73 kg)    Fetal Status: Fetal Heart Rate (bpm): 129 Fundal Height: 36 cm Movement: Present  Presentation: Vertex  General:  Alert, oriented and cooperative. Patient is in no acute distress.  Skin: Skin is warm and dry. No rash noted.   Cardiovascular: Normal heart rate noted  Respiratory: Normal respiratory effort, no problems with respiration noted  Abdomen: Soft, gravid, appropriate for gestational age. Pain/Pressure: Present     Pelvic: Vag. Bleeding: None Vag D/C Character: Thin  Cervical exam performed Dilation: 1.5 Effacement (%): 50 Station: -2  Pelvic cultures done  Extremities: Normal range of motion.  Edema: None  Mental Status: Normal mood and affect. Normal behavior. Normal judgment and thought content.   Urinalysis: Urine Protein: Negative Urine Glucose: Negative  Assessment and Plan:  Pregnancy: G1P0 at 4218w4d  Supervision of normal first pregnancy, antepartum, third trimester - Culture, Grp B Strep w/Rflx Suscept due to PCN allergy - GC[facility]Chlamydia [facility] Will follow up results and manage accordingly.  Preterm labor symptoms and general  obstetric precautions including but not limited to vaginal bleeding, contractions, leaking of fluid and fetal movement were reviewed in detail with the patient. Please refer to After Visit Summary for other counseling recommendations.  Return in about 1 week (around 05/23/2015) for OB Visit.   Tereso NewcomerUgonna A Telly Jawad, MD

## 2015-05-17 LAB — GC/CHLAMYDIA PROBE AMP (~~LOC~~) NOT AT ARMC
CHLAMYDIA, DNA PROBE: NEGATIVE
NEISSERIA GONORRHEA: NEGATIVE

## 2015-05-21 ENCOUNTER — Encounter: Payer: Self-pay | Admitting: Obstetrics & Gynecology

## 2015-05-21 DIAGNOSIS — O99820 Streptococcus B carrier state complicating pregnancy: Secondary | ICD-10-CM | POA: Insufficient documentation

## 2015-05-21 LAB — CULTURE, STREPTOCOCCUS GRP B W/SUSCEPT

## 2015-05-25 ENCOUNTER — Ambulatory Visit (INDEPENDENT_AMBULATORY_CARE_PROVIDER_SITE_OTHER): Payer: 59 | Admitting: Family Medicine

## 2015-05-25 VITALS — BP 115/69 | HR 102 | Wt 192.0 lb

## 2015-05-25 DIAGNOSIS — Z3403 Encounter for supervision of normal first pregnancy, third trimester: Secondary | ICD-10-CM

## 2015-05-25 NOTE — Progress Notes (Signed)
Subjective:  Sheila Kirby is a 20 y.o. G1P0 at 4974w6d being seen today for ongoing prenatal care.  She is currently monitored for the following issues for this low-risk pregnancy and has Supervision of normal first pregnancy, antepartum; Family history of spina bifida; and Group B Streptococcus carrier, +RV culture, currently pregnant on her problem list.  Patient reports no complaints.  Contractions: Irregular. Vag. Bleeding: None.  Movement: Present. Denies leaking of fluid.   The following portions of the patient's history were reviewed and updated as appropriate: allergies, current medications, past family history, past medical history, past social history, past surgical history and problem list. Problem list updated.  Objective:   Filed Vitals:   05/25/15 1449  BP: 115/69  Pulse: 102  Weight: 192 lb (87.091 kg)    Fetal Status: Fetal Heart Rate (bpm): 152   Movement: Present     General:  Alert, oriented and cooperative. Patient is in no acute distress.  Skin: Skin is warm and dry. No rash noted.   Cardiovascular: Normal heart rate noted  Respiratory: Normal respiratory effort, no problems with respiration noted  Abdomen: Soft, gravid, appropriate for gestational age. Pain/Pressure: Present     Pelvic: Vag. Bleeding: None Vag D/C Character: Thin   Cervical exam deferred        Extremities: Normal range of motion.  Edema: None  Mental Status: Normal mood and affect. Normal behavior. Normal judgment and thought content.   Urinalysis: Urine Protein: Negative Urine Glucose: Negative  Assessment and Plan:  Pregnancy: G1P0 at 6974w6d  1. Supervision of normal first pregnancy, antepartum, third trimester FHT and FH normal  Term labor symptoms and general obstetric precautions including but not limited to vaginal bleeding, contractions, leaking of fluid and fetal movement were reviewed in detail with the patient. Please refer to After Visit Summary for other counseling  recommendations.  Return in about 1 week (around 06/01/2015).   Levie HeritageJacob J Michae Grimley, DO

## 2015-06-01 ENCOUNTER — Ambulatory Visit (INDEPENDENT_AMBULATORY_CARE_PROVIDER_SITE_OTHER): Payer: 59 | Admitting: Family Medicine

## 2015-06-01 VITALS — BP 133/67 | HR 82 | Wt 188.0 lb

## 2015-06-01 DIAGNOSIS — O99820 Streptococcus B carrier state complicating pregnancy: Secondary | ICD-10-CM

## 2015-06-01 DIAGNOSIS — Z22330 Carrier of Group B streptococcus: Secondary | ICD-10-CM

## 2015-06-01 DIAGNOSIS — Z3403 Encounter for supervision of normal first pregnancy, third trimester: Secondary | ICD-10-CM

## 2015-06-01 NOTE — Progress Notes (Signed)
Subjective:  Sheila Kirby is a 20 y.o. G1P0 at 1666w6d being seen today for ongoing prenatal care.  She is currently monitored for the following issues for this low-risk pregnancy and has Supervision of normal first pregnancy, antepartum; Family history of spina bifida; and Group B Streptococcus carrier, +RV culture, currently pregnant on her problem list.  Patient reports no complaints.  Contractions: Irregular. Vag. Bleeding: None.  Movement: Present. Denies leaking of fluid.   The following portions of the patient's history were reviewed and updated as appropriate: allergies, current medications, past family history, past medical history, past social history, past surgical history and problem list. Problem list updated.  Objective:   Filed Vitals:   06/01/15 1517  BP: 133/67  Pulse: 82  Weight: 188 lb (85.276 kg)    Fetal Status: Fetal Heart Rate (bpm): 147   Movement: Present     General:  Alert, oriented and cooperative. Patient is in no acute distress.  Skin: Skin is warm and dry. No rash noted.   Cardiovascular: Normal heart rate noted  Respiratory: Normal respiratory effort, no problems with respiration noted  Abdomen: Soft, gravid, appropriate for gestational age. Pain/Pressure: Present     Pelvic: Vag. Bleeding: None Vag D/C Character: Thin   Cervical exam performed Dilation: 1.5 Effacement (%): 50 Station: -2  Extremities: Normal range of motion.  Edema: None  Mental Status: Normal mood and affect. Normal behavior. Normal judgment and thought content.   Urinalysis: Urine Protein: Negative Urine Glucose: Negative  Assessment and Plan:  Pregnancy: G1P0 at 8766w6d  1. Supervision of normal first pregnancy, antepartum, third trimester FH, FHT normal.  Discussed contraception - IUD vs Depo.  2. Group B Streptococcus carrier, +RV culture, currently pregnant Ancef during labor  Preterm labor symptoms and general obstetric precautions including but not limited to vaginal  bleeding, contractions, leaking of fluid and fetal movement were reviewed in detail with the patient. Please refer to After Visit Summary for other counseling recommendations.  Return in about 1 week (around 06/08/2015) for OB f/u.   Levie HeritageJacob J Donavan Kerlin, DO

## 2015-06-01 NOTE — Patient Instructions (Signed)
Medroxyprogesterone injection [Contraceptive] What is this medicine? MEDROXYPROGESTERONE (me DROX ee proe JES te rone) contraceptive injections prevent pregnancy. They provide effective birth control for 3 months. Depo-subQ Provera 104 is also used for treating pain related to endometriosis. This medicine may be used for other purposes; ask your health care provider or pharmacist if you have questions. What should I tell my health care provider before I take this medicine? They need to know if you have any of these conditions: -frequently drink alcohol -asthma -blood vessel disease or a history of a blood clot in the lungs or legs -bone disease such as osteoporosis -breast cancer -diabetes -eating disorder (anorexia nervosa or bulimia) -high blood pressure -HIV infection or AIDS -kidney disease -liver disease -mental depression -migraine -seizures (convulsions) -stroke -tobacco smoker -vaginal bleeding -an unusual or allergic reaction to medroxyprogesterone, other hormones, medicines, foods, dyes, or preservatives -pregnant or trying to get pregnant -breast-feeding How should I use this medicine? Depo-Provera Contraceptive injection is given into a muscle. Depo-subQ Provera 104 injection is given under the skin. These injections are given by a health care professional. You must not be pregnant before getting an injection. The injection is usually given during the first 5 days after the start of a menstrual period or 6 weeks after delivery of a baby. Talk to your pediatrician regarding the use of this medicine in children. Special care may be needed. These injections have been used in female children who have started having menstrual periods. Overdosage: If you think you have taken too much of this medicine contact a poison control center or emergency room at once. NOTE: This medicine is only for you. Do not share this medicine with others. What if I miss a dose? Try not to miss a  dose. You must get an injection once every 3 months to maintain birth control. If you cannot keep an appointment, call and reschedule it. If you wait longer than 13 weeks between Depo-Provera contraceptive injections or longer than 14 weeks between Depo-subQ Provera 104 injections, you could get pregnant. Use another method for birth control if you miss your appointment. You may also need a pregnancy test before receiving another injection. What may interact with this medicine? Do not take this medicine with any of the following medications: -bosentan This medicine may also interact with the following medications: -aminoglutethimide -antibiotics or medicines for infections, especially rifampin, rifabutin, rifapentine, and griseofulvin -aprepitant -barbiturate medicines such as phenobarbital or primidone -bexarotene -carbamazepine -medicines for seizures like ethotoin, felbamate, oxcarbazepine, phenytoin, topiramate -modafinil -St. John's wort This list may not describe all possible interactions. Give your health care provider a list of all the medicines, herbs, non-prescription drugs, or dietary supplements you use. Also tell them if you smoke, drink alcohol, or use illegal drugs. Some items may interact with your medicine. What should I watch for while using this medicine? This drug does not protect you against HIV infection (AIDS) or other sexually transmitted diseases. Use of this product may cause you to lose calcium from your bones. Loss of calcium may cause weak bones (osteoporosis). Only use this product for more than 2 years if other forms of birth control are not right for you. The longer you use this product for birth control the more likely you will be at risk for weak bones. Ask your health care professional how you can keep strong bones. You may have a change in bleeding pattern or irregular periods. Many females stop having periods while taking this drug. If you have   received your  injections on time, your chance of being pregnant is very low. If you think you may be pregnant, see your health care professional as soon as possible. Tell your health care professional if you want to get pregnant within the next year. The effect of this medicine may last a long time after you get your last injection. What side effects may I notice from receiving this medicine? Side effects that you should report to your doctor or health care professional as soon as possible: -allergic reactions like skin rash, itching or hives, swelling of the face, lips, or tongue -breast tenderness or discharge -breathing problems -changes in vision -depression -feeling faint or lightheaded, falls -fever -pain in the abdomen, chest, groin, or leg -problems with balance, talking, walking -unusually weak or tired -yellowing of the eyes or skin Side effects that usually do not require medical attention (report to your doctor or health care professional if they continue or are bothersome): -acne -fluid retention and swelling -headache -irregular periods, spotting, or absent periods -temporary pain, itching, or skin reaction at site where injected -weight gain This list may not describe all possible side effects. Call your doctor for medical advice about side effects. You may report side effects to FDA at 1-800-FDA-1088. Where should I keep my medicine? This does not apply. The injection will be given to you by a health care professional. NOTE: This sheet is a summary. It may not cover all possible information. If you have questions about this medicine, talk to your doctor, pharmacist, or health care provider.    2016, Elsevier/Gold Standard. (2008-01-09 18:37:56)  Levonorgestrel intrauterine device (IUD) What is this medicine? LEVONORGESTREL IUD (LEE voe nor jes trel) is a contraceptive (birth control) device. The device is placed inside the uterus by a healthcare professional. It is used to prevent  pregnancy and can also be used to treat heavy bleeding that occurs during your period. Depending on the device, it can be used for 3 to 5 years. This medicine may be used for other purposes; ask your health care provider or pharmacist if you have questions. What should I tell my health care provider before I take this medicine? They need to know if you have any of these conditions: -abnormal Pap smear -cancer of the breast, uterus, or cervix -diabetes -endometritis -genital or pelvic infection now or in the past -have more than one sexual partner or your partner has more than one partner -heart disease -history of an ectopic or tubal pregnancy -immune system problems -IUD in place -liver disease or tumor -problems with blood clots or take blood-thinners -use intravenous drugs -uterus of unusual shape -vaginal bleeding that has not been explained -an unusual or allergic reaction to levonorgestrel, other hormones, silicone, or polyethylene, medicines, foods, dyes, or preservatives -pregnant or trying to get pregnant -breast-feeding How should I use this medicine? This device is placed inside the uterus by a health care professional. Talk to your pediatrician regarding the use of this medicine in children. Special care may be needed. Overdosage: If you think you have taken too much of this medicine contact a poison control center or emergency room at once. NOTE: This medicine is only for you. Do not share this medicine with others. What if I miss a dose? This does not apply. What may interact with this medicine? Do not take this medicine with any of the following medications: -amprenavir -bosentan -fosamprenavir This medicine may also interact with the following medications: -aprepitant -barbiturate medicines for inducing sleep   or treating seizures -bexarotene -griseofulvin -medicines to treat seizures like carbamazepine, ethotoin, felbamate, oxcarbazepine, phenytoin,  topiramate -modafinil -pioglitazone -rifabutin -rifampin -rifapentine -some medicines to treat HIV infection like atazanavir, indinavir, lopinavir, nelfinavir, tipranavir, ritonavir -St. John's wort -warfarin This list may not describe all possible interactions. Give your health care provider a list of all the medicines, herbs, non-prescription drugs, or dietary supplements you use. Also tell them if you smoke, drink alcohol, or use illegal drugs. Some items may interact with your medicine. What should I watch for while using this medicine? Visit your doctor or health care professional for regular check ups. See your doctor if you or your partner has sexual contact with others, becomes HIV positive, or gets a sexual transmitted disease. This product does not protect you against HIV infection (AIDS) or other sexually transmitted diseases. You can check the placement of the IUD yourself by reaching up to the top of your vagina with clean fingers to feel the threads. Do not pull on the threads. It is a good habit to check placement after each menstrual period. Call your doctor right away if you feel more of the IUD than just the threads or if you cannot feel the threads at all. The IUD may come out by itself. You may become pregnant if the device comes out. If you notice that the IUD has come out use a backup birth control method like condoms and call your health care provider. Using tampons will not change the position of the IUD and are okay to use during your period. What side effects may I notice from receiving this medicine? Side effects that you should report to your doctor or health care professional as soon as possible: -allergic reactions like skin rash, itching or hives, swelling of the face, lips, or tongue -fever, flu-like symptoms -genital sores -high blood pressure -no menstrual period for 6 weeks during use -pain, swelling, warmth in the leg -pelvic pain or tenderness -severe or  sudden headache -signs of pregnancy -stomach cramping -sudden shortness of breath -trouble with balance, talking, or walking -unusual vaginal bleeding, discharge -yellowing of the eyes or skin Side effects that usually do not require medical attention (report to your doctor or health care professional if they continue or are bothersome): -acne -breast pain -change in sex drive or performance -changes in weight -cramping, dizziness, or faintness while the device is being inserted -headache -irregular menstrual bleeding within first 3 to 6 months of use -nausea This list may not describe all possible side effects. Call your doctor for medical advice about side effects. You may report side effects to FDA at 1-800-FDA-1088. Where should I keep my medicine? This does not apply. NOTE: This sheet is a summary. It may not cover all possible information. If you have questions about this medicine, talk to your doctor, pharmacist, or health care provider.    2016, Elsevier/Gold Standard. (2011-01-18 13:54:04)  

## 2015-06-08 ENCOUNTER — Ambulatory Visit (INDEPENDENT_AMBULATORY_CARE_PROVIDER_SITE_OTHER): Payer: 59 | Admitting: Family Medicine

## 2015-06-08 VITALS — BP 124/72 | HR 72 | Wt 189.0 lb

## 2015-06-08 DIAGNOSIS — Z3403 Encounter for supervision of normal first pregnancy, third trimester: Secondary | ICD-10-CM

## 2015-06-08 NOTE — Progress Notes (Signed)
Subjective:  Sheila Kirby is a 20 y.o. G1P0 at 7211w6d being seen today for ongoing prenatal care.  She is currently monitored for the following issues for this low-risk pregnancy and has Supervision of normal first pregnancy, antepartum; Family history of spina bifida; and Group B Streptococcus carrier, +RV culture, currently pregnant on her problem list.  Patient reports no complaints.  Contractions: Irregular. Vag. Bleeding: None.  Movement: Present. Denies leaking of fluid.   The following portions of the patient's history were reviewed and updated as appropriate: allergies, current medications, past family history, past medical history, past social history, past surgical history and problem list. Problem list updated.  Objective:   Filed Vitals:   06/08/15 1446  Weight: 189 lb (85.73 kg)    Fetal Status:     Movement: Present     General:  Alert, oriented and cooperative. Patient is in no acute distress.  Skin: Skin is warm and dry. No rash noted.   Cardiovascular: Normal heart rate noted  Respiratory: Normal respiratory effort, no problems with respiration noted  Abdomen: Soft, gravid, appropriate for gestational age. Pain/Pressure: Present     Pelvic: Vag. Bleeding: None Vag D/C Character: Thin   Cervical exam performed        Extremities: Normal range of motion.  Edema: None  Mental Status: Normal mood and affect. Normal behavior. Normal judgment and thought content.   Urinalysis:      Assessment and Plan:  Pregnancy: G1P0 at 5011w6d  1. Supervision of normal first pregnancy, antepartum, third trimester FHT and FH normal   Term labor symptoms and general obstetric precautions including but not limited to vaginal bleeding, contractions, leaking of fluid and fetal movement were reviewed in detail with the patient. Please refer to After Visit Summary for other counseling recommendations.  No Follow-up on file.   Levie HeritageJacob J Stinson, DO

## 2015-06-13 ENCOUNTER — Ambulatory Visit (INDEPENDENT_AMBULATORY_CARE_PROVIDER_SITE_OTHER): Payer: 59 | Admitting: Family Medicine

## 2015-06-13 VITALS — BP 117/73 | HR 92 | Wt 192.0 lb

## 2015-06-13 DIAGNOSIS — Z3403 Encounter for supervision of normal first pregnancy, third trimester: Secondary | ICD-10-CM

## 2015-06-13 NOTE — Progress Notes (Signed)
Subjective:  Sheila Kirby is a 20 y.o. G1P0 at 9267w4d being seen today for ongoing prenatal care.  She is currently monitored for the following issues for this low-risk pregnancy and has Supervision of normal first pregnancy, antepartum; Family history of spina bifida; and Group B Streptococcus carrier, +RV culture, currently pregnant on her problem list.  Had some cramping and bloody show after last cervical check.  Patient reports no complaints.  Contractions: Irregular. Vag. Bleeding: None.  Movement: Present. Denies leaking of fluid.   The following portions of the patient's history were reviewed and updated as appropriate: allergies, current medications, past family history, past medical history, past social history, past surgical history and problem list. Problem list updated.  Objective:   Filed Vitals:   06/13/15 1047  BP: 117/73  Pulse: 92  Weight: 192 lb (87.091 kg)    Fetal Status: Fetal Heart Rate (bpm): 135   Movement: Present  Presentation: Vertex  General:  Alert, oriented and cooperative. Patient is in no acute distress.  Skin: Skin is warm and dry. No rash noted.   Cardiovascular: Normal heart rate noted  Respiratory: Normal respiratory effort, no problems with respiration noted  Abdomen: Soft, gravid, appropriate for gestational age. Pain/Pressure: Present     Pelvic: Cervical exam performed Dilation: 2 Effacement (%): 50 Station: -2  Extremities: Normal range of motion.  Edema: None  Mental Status: Normal mood and affect. Normal behavior. Normal judgment and thought content.   Urinalysis: Urine Protein: Negative Urine Glucose: Negative  Assessment and Plan:  Pregnancy: G1P0 at 6967w4d  1. Supervision of normal first pregnancy, antepartum, third trimester Follow up 1 week.  Will schedule induction around 41 weeks if no labor.  Term labor symptoms and general obstetric precautions including but not limited to vaginal bleeding, contractions, leaking of fluid and  fetal movement were reviewed in detail with the patient. Please refer to After Visit Summary for other counseling recommendations.  No Follow-up on file.   Levie HeritageJacob J Stinson, DO

## 2015-06-20 ENCOUNTER — Encounter (HOSPITAL_COMMUNITY): Payer: Self-pay | Admitting: General Practice

## 2015-06-20 ENCOUNTER — Telehealth (HOSPITAL_COMMUNITY): Payer: Self-pay | Admitting: General Practice

## 2015-06-20 ENCOUNTER — Ambulatory Visit (INDEPENDENT_AMBULATORY_CARE_PROVIDER_SITE_OTHER): Payer: 59 | Admitting: Obstetrics & Gynecology

## 2015-06-20 VITALS — BP 103/59 | HR 99 | Wt 194.0 lb

## 2015-06-20 DIAGNOSIS — O480 Post-term pregnancy: Secondary | ICD-10-CM | POA: Diagnosis not present

## 2015-06-20 DIAGNOSIS — Z3403 Encounter for supervision of normal first pregnancy, third trimester: Secondary | ICD-10-CM

## 2015-06-20 NOTE — Progress Notes (Signed)
Subjective:  Sheila Kirby is a 20 y.o. G1P0 at 159w4d being seen today for ongoing prenatal care.  She is currently monitored for the following issues for this low-risk pregnancy and has Supervision of normal first pregnancy, antepartum; Family history of spina bifida; Group B Streptococcus carrier, +RV culture, currently pregnant; and Post term pregnancy over 40 weeks on her problem list.  Patient reports occasional contractions.  Contractions: Irregular. Vag. Bleeding: None.  Movement: Present. Denies leaking of fluid.   The following portions of the patient's history were reviewed and updated as appropriate: allergies, current medications, past family history, past medical history, past social history, past surgical history and problem list. Problem list updated.  Objective:   Filed Vitals:   06/20/15 1009  BP: 103/59  Pulse: 99  Weight: 194 lb (87.998 kg)    Fetal Status: Fetal Heart Rate (bpm): NST Fundal Height: 39 cm Movement: Present     General:  Alert, oriented and cooperative. Patient is in no acute distress.  Skin: Skin is warm and dry. No rash noted.   Cardiovascular: Normal heart rate noted  Respiratory: Normal respiratory effort, no problems with respiration noted  Abdomen: Soft, gravid, appropriate for gestational age. Pain/Pressure: Present     Pelvic: Cervical exam deferred        Extremities: Normal range of motion.  Edema: None  Mental Status: Normal mood and affect. Normal behavior. Normal judgment and thought content.   Urinalysis: Urine Protein: Negative Urine Glucose: Negative NST performed today was reviewed and was found to be reactive.  AFI was also normal.  Continue recommended antenatal testing and prenatal care.  Assessment and Plan:  Pregnancy: G1P0 at 1059w4d  1. Post term pregnancy over 40 weeks Normal postdates testing today. IOL scheduled for 41 weeks.  2. Supervision of normal first pregnancy, antepartum, third trimester Term labor symptoms and  general obstetric precautions including but not limited to vaginal bleeding, contractions, leaking of fluid and fetal movement were reviewed in detail with the patient. Please refer to After Visit Summary for other counseling recommendations.  Return in about 6 weeks (around 08/01/2015) for Postpartum check.   Tereso NewcomerUgonna A Jeanpaul Biehl, MD

## 2015-06-20 NOTE — Patient Instructions (Signed)
Return to clinic for any scheduled appointments or obstetric concerns, or go to [facility] for evaluation  

## 2015-06-23 ENCOUNTER — Encounter (HOSPITAL_COMMUNITY): Payer: Self-pay

## 2015-06-23 ENCOUNTER — Inpatient Hospital Stay (HOSPITAL_COMMUNITY): Payer: 59 | Admitting: Anesthesiology

## 2015-06-23 ENCOUNTER — Inpatient Hospital Stay (HOSPITAL_COMMUNITY)
Admission: RE | Admit: 2015-06-23 | Discharge: 2015-06-25 | DRG: 775 | Disposition: A | Discharge status: Home / Self Care | Payer: 59 | Source: Ambulatory Visit | Attending: Obstetrics & Gynecology | Admitting: Obstetrics & Gynecology

## 2015-06-23 VITALS — BP 107/70 | HR 69 | Temp 97.3°F | Resp 18 | Ht 68.0 in | Wt 194.0 lb

## 2015-06-23 DIAGNOSIS — O99824 Streptococcus B carrier state complicating childbirth: Secondary | ICD-10-CM | POA: Diagnosis present

## 2015-06-23 DIAGNOSIS — K219 Gastro-esophageal reflux disease without esophagitis: Secondary | ICD-10-CM | POA: Diagnosis present

## 2015-06-23 DIAGNOSIS — Z8249 Family history of ischemic heart disease and other diseases of the circulatory system: Secondary | ICD-10-CM

## 2015-06-23 DIAGNOSIS — Z3A41 41 weeks gestation of pregnancy: Secondary | ICD-10-CM | POA: Diagnosis not present

## 2015-06-23 DIAGNOSIS — Z833 Family history of diabetes mellitus: Secondary | ICD-10-CM | POA: Diagnosis not present

## 2015-06-23 DIAGNOSIS — O9962 Diseases of the digestive system complicating childbirth: Secondary | ICD-10-CM | POA: Diagnosis present

## 2015-06-23 DIAGNOSIS — O480 Post-term pregnancy: Secondary | ICD-10-CM | POA: Diagnosis not present

## 2015-06-23 DIAGNOSIS — O99820 Streptococcus B carrier state complicating pregnancy: Secondary | ICD-10-CM

## 2015-06-23 LAB — CBC
HEMATOCRIT: 34.2 % — AB (ref 36.0–46.0)
Hemoglobin: 11.8 g/dL — ABNORMAL LOW (ref 12.0–15.0)
MCH: 28.9 pg (ref 26.0–34.0)
MCHC: 34.5 g/dL (ref 30.0–36.0)
MCV: 83.8 fL (ref 78.0–100.0)
PLATELETS: 193 10*3/uL (ref 150–400)
RBC: 4.08 MIL/uL (ref 3.87–5.11)
RDW: 12.7 % (ref 11.5–15.5)
WBC: 10.7 10*3/uL — ABNORMAL HIGH (ref 4.0–10.5)

## 2015-06-23 LAB — TYPE AND SCREEN
ABO/RH(D): O POS
Antibody Screen: NEGATIVE

## 2015-06-23 LAB — ABO/RH: ABO/RH(D): O POS

## 2015-06-23 LAB — RPR: RPR: NONREACTIVE

## 2015-06-23 MED ORDER — FENTANYL 2.5 MCG/ML BUPIVACAINE 1/10 % EPIDURAL INFUSION (WH - ANES): 14.0000 mL/h | INTRAMUSCULAR | Status: DC | PRN

## 2015-06-23 MED ORDER — FENTANYL CITRATE (PF) 100 MCG/2ML IJ SOLN
100.0000 ug | INTRAMUSCULAR | Status: DC | PRN
  Administered 2015-06-23: 100 ug via INTRAVENOUS
  Filled 2015-06-23: qty 2

## 2015-06-23 MED ORDER — ACETAMINOPHEN 325 MG PO TABS
650.0000 mg | ORAL_TABLET | ORAL | Status: DC | PRN
Start: 2015-06-23 — End: 2015-06-25

## 2015-06-23 MED ORDER — COCONUT OIL OIL
1.0000 "application " | TOPICAL_OIL | Status: DC | PRN
  Administered 2015-06-24: 1 via TOPICAL
  Filled 2015-06-23: qty 120

## 2015-06-23 MED ORDER — SENNOSIDES-DOCUSATE SODIUM 8.6-50 MG PO TABS
2.0000 | ORAL_TABLET | ORAL | Status: DC
  Administered 2015-06-23: 2 via ORAL
  Filled 2015-06-23 (×2): qty 2

## 2015-06-23 MED ORDER — WITCH HAZEL-GLYCERIN EX PADS: 1.0000 "application " | MEDICATED_PAD | CUTANEOUS | Status: DC | PRN

## 2015-06-23 MED ORDER — CEFAZOLIN IN D5W 1 GM/50ML IV SOLN
1.0000 g | Freq: Three times a day (TID) | INTRAVENOUS | Status: DC
  Filled 2015-06-23 (×2): qty 50

## 2015-06-23 MED ORDER — LIDOCAINE HCL (PF) 1 % IJ SOLN
30.0000 mL | INTRAMUSCULAR | Status: DC | PRN
  Filled 2015-06-23: qty 30

## 2015-06-23 MED ORDER — DIBUCAINE 1 % RE OINT: 1.0000 "application " | TOPICAL_OINTMENT | RECTAL | Status: DC | PRN

## 2015-06-23 MED ORDER — PRENATAL MULTIVITAMIN CH
1.0000 | ORAL_TABLET | Freq: Every day | ORAL | Status: DC
  Administered 2015-06-24 – 2015-06-25 (×2): 1 via ORAL
  Filled 2015-06-23 (×2): qty 1

## 2015-06-23 MED ORDER — OXYTOCIN 40 UNITS IN LACTATED RINGERS INFUSION - SIMPLE MED
2.5000 [IU]/h | INTRAVENOUS | Status: DC
  Filled 2015-06-23: qty 1000

## 2015-06-23 MED ORDER — LACTATED RINGERS IV SOLN
INTRAVENOUS | Status: DC
  Administered 2015-06-23 (×2): via INTRAVENOUS

## 2015-06-23 MED ORDER — LACTATED RINGERS IV SOLN: INTRAVENOUS | Status: DC

## 2015-06-23 MED ORDER — TERBUTALINE SULFATE 1 MG/ML IJ SOLN
0.2500 mg | Freq: Once | INTRAMUSCULAR | Status: DC | PRN
  Filled 2015-06-23: qty 1

## 2015-06-23 MED ORDER — ZOLPIDEM TARTRATE 5 MG PO TABS: 5.0000 mg | ORAL_TABLET | Freq: Every evening | ORAL | Status: DC | PRN

## 2015-06-23 MED ORDER — OXYTOCIN BOLUS FROM INFUSION
500.0000 mL | INTRAVENOUS | Status: DC
  Administered 2015-06-23: 500 mL via INTRAVENOUS

## 2015-06-23 MED ORDER — ONDANSETRON HCL 4 MG PO TABS: 4.0000 mg | ORAL_TABLET | ORAL | Status: DC | PRN

## 2015-06-23 MED ORDER — EPHEDRINE 5 MG/ML INJ
10.0000 mg | INTRAVENOUS | Status: DC | PRN
  Filled 2015-06-23: qty 2

## 2015-06-23 MED ORDER — OXYCODONE-ACETAMINOPHEN 5-325 MG PO TABS: 1.0000 | ORAL_TABLET | ORAL | Status: DC | PRN

## 2015-06-23 MED ORDER — CEFAZOLIN SODIUM-DEXTROSE 2-4 GM/100ML-% IV SOLN
2.0000 g | Freq: Once | INTRAVENOUS | Status: AC
  Administered 2015-06-23: 2 g via INTRAVENOUS

## 2015-06-23 MED ORDER — PHENYLEPHRINE 40 MCG/ML (10ML) SYRINGE FOR IV PUSH (FOR BLOOD PRESSURE SUPPORT)
80.0000 ug | PREFILLED_SYRINGE | INTRAVENOUS | Status: DC | PRN
  Filled 2015-06-23: qty 5

## 2015-06-23 MED ORDER — LACTATED RINGERS IV SOLN: 500.0000 mL | Freq: Once | INTRAVENOUS | Status: DC

## 2015-06-23 MED ORDER — MISOPROSTOL 25 MCG QUARTER TABLET
25.0000 ug | ORAL_TABLET | ORAL | Status: DC | PRN
  Administered 2015-06-23: 25 ug via VAGINAL
  Filled 2015-06-23: qty 1
  Filled 2015-06-23: qty 0.25

## 2015-06-23 MED ORDER — SOD CITRATE-CITRIC ACID 500-334 MG/5ML PO SOLN: 30.0000 mL | ORAL | Status: DC | PRN

## 2015-06-23 MED ORDER — IBUPROFEN 600 MG PO TABS
600.0000 mg | ORAL_TABLET | Freq: Four times a day (QID) | ORAL | Status: DC
  Administered 2015-06-23 – 2015-06-25 (×8): 600 mg via ORAL
  Filled 2015-06-23 (×8): qty 1

## 2015-06-23 MED ORDER — BENZOCAINE-MENTHOL 20-0.5 % EX AERO: 1.0000 "application " | INHALATION_SPRAY | CUTANEOUS | Status: DC | PRN

## 2015-06-23 MED ORDER — PHENYLEPHRINE 40 MCG/ML (10ML) SYRINGE FOR IV PUSH (FOR BLOOD PRESSURE SUPPORT)
PREFILLED_SYRINGE | INTRAVENOUS | Status: AC
  Filled 2015-06-23: qty 20

## 2015-06-23 MED ORDER — ONDANSETRON HCL 4 MG/2ML IJ SOLN
4.0000 mg | Freq: Four times a day (QID) | INTRAMUSCULAR | Status: DC | PRN
Start: 2015-06-23 — End: 2015-06-23
  Administered 2015-06-23: 4 mg via INTRAVENOUS
  Filled 2015-06-23: qty 2

## 2015-06-23 MED ORDER — LIDOCAINE HCL (PF) 1 % IJ SOLN
INTRAMUSCULAR | Status: DC | PRN
  Administered 2015-06-23 (×2): 4 mL

## 2015-06-23 MED ORDER — ONDANSETRON HCL 4 MG/2ML IJ SOLN: 4.0000 mg | INTRAMUSCULAR | Status: DC | PRN

## 2015-06-23 MED ORDER — SIMETHICONE 80 MG PO CHEW: 80.0000 mg | CHEWABLE_TABLET | ORAL | Status: DC | PRN

## 2015-06-23 MED ORDER — LACTATED RINGERS IV SOLN: 500.0000 mL | INTRAVENOUS | Status: DC | PRN

## 2015-06-23 MED ORDER — DIPHENHYDRAMINE HCL 50 MG/ML IJ SOLN: 12.5000 mg | INTRAMUSCULAR | Status: DC | PRN

## 2015-06-23 MED ORDER — FENTANYL 2.5 MCG/ML BUPIVACAINE 1/10 % EPIDURAL INFUSION (WH - ANES)
INTRAMUSCULAR | Status: AC
  Administered 2015-06-23: 14 mL/h via EPIDURAL
  Filled 2015-06-23: qty 125

## 2015-06-23 MED ORDER — OXYCODONE-ACETAMINOPHEN 5-325 MG PO TABS: 2.0000 | ORAL_TABLET | ORAL | Status: DC | PRN

## 2015-06-23 MED ORDER — FENTANYL 2.5 MCG/ML BUPIVACAINE 1/10 % EPIDURAL INFUSION (WH - ANES)
14.0000 mL/h | INTRAMUSCULAR | Status: DC | PRN
  Administered 2015-06-23 (×2): 14 mL/h via EPIDURAL

## 2015-06-23 MED ORDER — TETANUS-DIPHTH-ACELL PERTUSSIS 5-2.5-18.5 LF-MCG/0.5 IM SUSP: 0.5000 mL | Freq: Once | INTRAMUSCULAR | Status: DC

## 2015-06-23 MED ORDER — DIPHENHYDRAMINE HCL 25 MG PO CAPS: 25.0000 mg | ORAL_CAPSULE | Freq: Four times a day (QID) | ORAL | Status: DC | PRN

## 2015-06-23 MED ORDER — ACETAMINOPHEN 325 MG PO TABS: 650.0000 mg | ORAL_TABLET | ORAL | Status: DC | PRN

## 2015-06-23 NOTE — Lactation Note (Signed)
This note was copied from a baby's chart. Lactation Consultation Note  Initial visit made.  Baby is 3 hours old.   Baby has been showing feeding cues frequently and feeding since birth.  Mom is latching baby without difficulty on right side but unable to get baby to latch to left.  Nipple on left is flat and inverts slightly with compression.  Assisted with postioning baby in football hold on left side.  Mom can hand express a large amount of colostrum.  Baby opens wide and latched easily with good breast compression.  Instructed mom on technique for easier and deeper latch.  Baby nursed actively for 10 minutes and then came off breast content and relaxed.  A manual pump given to mom to use to pre pump on left side.  Instructed to feed with any cue and to call for assist prn.  Breastfeeding consultation services and support information given and reviewed.  Patient Name: Sheila Kirby Reason for consult: Initial assessment   Maternal Data Has patient been taught Hand Expression?: Yes Does the patient have breastfeeding experience prior to this delivery?: No  Feeding Feeding Type: Breast Fed Length of feed: 10 min  LATCH Score/Interventions Latch: Grasps breast easily, tongue down, lips flanged, rhythmical sucking. Intervention(s): Adjust position;Assist with latch;Breast massage;Breast compression  Audible Swallowing: A few with stimulation Intervention(s): Skin to skin Intervention(s): Hand expression;Alternate breast massage  Type of Nipple: Flat (right nipple erect, left nipple flat and slightly inverts) Intervention(s): Hand pump  Comfort (Breast/Nipple): Soft / non-tender     Hold (Positioning): Assistance needed to correctly position infant at breast and maintain latch. Intervention(s): Breastfeeding basics reviewed;Support Pillows;Position options  LATCH Score: 7  Lactation Tools Discussed/Used     Consult Status Consult Status:  Follow-up Date: 06/24/15 Follow-up type: In-patient    Huston FoleyMOULDEN, Coraleigh Sheeran S Kirby, 2:41 PM

## 2015-06-23 NOTE — Progress Notes (Signed)
Labor Progress Note Sheila Kirby is a 20 y.o. G1P0 at 4279w0d presented for IOL for PD S: reports feeling some pressures. Pain well controlled with epidural  O:  BP 127/82 mmHg  Pulse 115  Temp(Src) 98.5 F (36.9 C) (Oral)  Resp 18  Ht 5\' 8"  (1.727 m)  Wt 194 lb (87.998 kg)  BMI 29.50 kg/m2  SpO2 98%  LMP  (Approximate) EFM: 130/mod var/recurrent variable decels earlier  CVE: Dilation: 10 Dilation Complete Date: 06/23/15 Dilation Complete Time: 0915 Effacement (%): 100 Cervical Position: Middle Station: 0 Presentation: Vertex Exam by:: wouk   A&P: 20 y.o. G1P0 7679w0d admitted for IOL for PD #Labor: progressed well on her own after one cytetec. Continue expectant #Pain: well-controlled with epidural #FWB: CAT-2. Recurrent variable decels with return to baseline. Has moderate variability and accels. She is fully dilated as well. Will hold off IUPC now #GBS: positive. Ancef  Almon Herculesaye T Gonfa, MD 10:01 AM

## 2015-06-23 NOTE — Anesthesia Procedure Notes (Signed)
Epidural Patient location during procedure: OB  Staffing Anesthesiologist: Lewie LoronGERMEROTH, Jamarquis Crull Performed by: anesthesiologist   Preanesthetic Checklist Completed: patient identified, pre-op evaluation, timeout performed, IV checked, risks and benefits discussed and monitors and equipment checked  Epidural Patient position: sitting Prep: site prepped and draped and DuraPrep Patient monitoring: heart rate Approach: midline Injection technique: LOR air and LOR saline  Needle:  Needle type: Tuohy  Needle gauge: 17 G Needle length: 9 cm Needle insertion depth: 7 cm Catheter type: closed end flexible Catheter size: 19 Gauge Catheter at skin depth: 13 cm Test dose: negative  Assessment Sensory level: T8 Events: blood not aspirated, injection not painful, no injection resistance, negative IV test and no paresthesia  Additional Notes Reason for block:procedure for pain

## 2015-06-23 NOTE — Anesthesia Rounding Note (Signed)
  [facility] Epidural Rounding Note  Patient: Sheila Kirby, 20 y.o., female  Patient's current pain level: Pain Score: 0-No pain (06/23/15 0900)  Agreed upon pain management level: 5  Epidural intervention: No   Comments: Patient comfortable, no pain  [facility] Her[facility] HospitalEIGHT,Aalaya Yadao 06/23/2015

## 2015-06-23 NOTE — Anesthesia Postprocedure Evaluation (Signed)
Anesthesia Post Note  Patient: Sheila Kirby  Procedure(s) Performed: * No procedures listed *  Patient location during evaluation: Mother Baby Anesthesia Type: Epidural Level of consciousness: awake Pain management: satisfactory to patient Vital Signs Assessment: post-procedure vital signs reviewed and stable Respiratory status: spontaneous breathing Cardiovascular status: stable Anesthetic complications: no     Last Vitals:  Filed Vitals:   06/23/15 1245 06/23/15 1345  BP: 115/57 95/59  Pulse: 94 89  Temp: 36.9 C 36.4 C  Resp: 19 19    Last Pain:  Filed Vitals:   06/23/15 1413  PainSc: 0-No pain   Pain Goal:                 KeyCorpBURGER,Dominik Lauricella

## 2015-06-23 NOTE — Anesthesia Preprocedure Evaluation (Signed)
Anesthesia Evaluation  Patient identified by MRN, date of birth, ID band Patient awake    Reviewed: Allergy & Precautions, NPO status , Patient's Chart, lab work & pertinent test results  Airway Mallampati: II  TM Distance: >3 FB Neck ROM: Full    Dental no notable dental hx.    Pulmonary neg pulmonary ROS,    Pulmonary exam normal breath sounds clear to auscultation       Cardiovascular negative cardio ROS Normal cardiovascular exam Rhythm:Regular Rate:Normal     Neuro/Psych negative neurological ROS  negative psych ROS   GI/Hepatic Neg liver ROS, GERD  ,  Endo/Other  negative endocrine ROS  Renal/GU negative Renal ROS     Musculoskeletal negative musculoskeletal ROS (+)   Abdominal   Peds  Hematology negative hematology ROS (+)   Anesthesia Other Findings   Reproductive/Obstetrics (+) Pregnancy                             Anesthesia Physical Anesthesia Plan  ASA: II  Anesthesia Plan: Epidural   Post-op Pain Management:    Induction:   Airway Management Planned:   Additional Equipment:   Intra-op Plan:   Post-operative Plan:   Informed Consent: I have reviewed the patients History and Physical, chart, labs and discussed the procedure including the risks, benefits and alternatives for the proposed anesthesia with the patient or authorized representative who has indicated his/her understanding and acceptance.     Plan Discussed with:   Anesthesia Plan Comments:         Anesthesia Quick Evaluation

## 2015-06-23 NOTE — H&P (Signed)
Sheila Kirby is a 20 y.o. female G1 @ 41.0wks by 7wk U/S presenting for IOL due to post term pregnancy. She denies bleeding or leaking; irreg mild ctx. [facility] preg [facility] and has been essentially unremarkable other than GBS positive.  History OB History    Gravida Para Term Preterm AB TAB SAB Ectopic Multiple Living   1              Past Medical History  Diagnosis Date  . Medical history non-contributory    Past Surgical History  Procedure Laterality Date  . No past surgeries    . Wisdom tooth extraction     Family History: family history includes Cancer in her maternal grandfather, paternal grandfather, and sister; Diabetes in her father; Hypertension in her mother; Spina bifida in her paternal uncle. There is no history of Stroke, Miscarriages / Stillbirths, or Mental retardation. Social History:  reports that she has never smoked. She does not have any smokeless tobacco history on file. She reports that she does not drink alcohol or use illicit drugs.   Prenatal Transfer Tool  Maternal Diabetes: No Genetic Screening: Declined Maternal Ultrasounds/Referrals: Normal Fetal Ultrasounds or other Referrals:  None Maternal Substance Abuse:  No Significant Maternal Medications:  None Significant Maternal Lab Results:  Lab values include: Group B Strep positive Other Comments:  None  ROS    There were no vitals taken for this visit. Exam Physical Exam  Constitutional: She is oriented to person, place, and time. She appears well-developed.  HENT:  Head: Normocephalic.  Neck: Normal range of motion.  Cardiovascular: Normal rate.   Respiratory: Effort normal.  GI:  EFM 150s, +accels, occ mi variables Ctx irreg  Genitourinary:  Cx 2/50/-2 per RN exam  Musculoskeletal: Normal range of motion.  Neurological: She is alert and oriented to person, place, and time.  Skin: Skin is warm and dry.  Psychiatric: She has a normal mood and affect. Her  behavior is normal. Thought content normal.    Prenatal labs: ABO, Rh: O/POS/-- (11/14 0001) Antibody: NEG (11/14 0001) Rubella: 2.38 (11/14 0001) RPR: NON REAC (03/30 1036)  HBsAg: NEGATIVE (11/14 0001)  HIV: NONREACTIVE (03/30 1036)  GBS: Positive (05/15 0000)   Assessment/Plan: IUP@41 .0wks Unfavorable cx GBS pos  [facility] Plan cytotec x 1 dose for cx ripening Plan for Ancef (PCN allergy- stomach pain) once in active labor/ROM Anticipate SVD   Lorre Opdahl CNM 06/23/2015, 1:01 AM

## 2015-06-23 NOTE — Anesthesia Pain Management Evaluation Note (Signed)
  [facility] Pain Management Visit Note  Patient: Sheila Kirby, 20 y.o., female  "Hello I am a member of the anesthesia team at [facility]. We have an anesthesia team available at all times to provide care throughout the hospital, including epidural management and anesthesia for C-section. I don't know your plan for the delivery whether it a natural birth, water birth, IV sedation, nitrous supplementation, doula or epidural, but we want to meet your pain goals."   1.Was your pain managed to your expectations on prior hospitalizations?   yea  2.What is your expectation for pain management during this hospitalization?     epidural  3.How can we help you reach that goal? epidural  Record the patient's initial score and the patient's pain goal.   Pain: 0/10  Pain Goal: less than 4 [facility] wants you to be able to say your pain was always managed very well.  Salome ArntSterling, Shay Bartoli Marie 06/23/2015

## 2015-06-24 LAB — CBC
HCT: 31.2 % — ABNORMAL LOW (ref 36.0–46.0)
Hemoglobin: 10.7 g/dL — ABNORMAL LOW (ref 12.0–15.0)
MCH: 29.5 pg (ref 26.0–34.0)
MCHC: 34.3 g/dL (ref 30.0–36.0)
MCV: 86 fL (ref 78.0–100.0)
PLATELETS: 172 10*3/uL (ref 150–400)
RBC: 3.63 MIL/uL — ABNORMAL LOW (ref 3.87–5.11)
RDW: 12.9 % (ref 11.5–15.5)
WBC: 10.6 10*3/uL — ABNORMAL HIGH (ref 4.0–10.5)

## 2015-06-24 NOTE — Progress Notes (Signed)
POSTPARTUM PROGRESS NOTE  Post Partum Day  Subjective:  Sheila Kirby is a 20 y.o. G1P1001 3470w0d s/p induced VD for PD .  No acute events overnight.  Pt denies problems with ambulating, voiding or po intake.  She denies nausea or vomiting.  Pain is well controlled.  She has had flatus. She has not had bowel movement.  Lochia Minimal.   Objective: Blood pressure 94/46, pulse 63, temperature 97.9 F (36.6 C), temperature source Oral, resp. rate 17, height 5\' 8"  (1.727 m), weight 194 lb (87.998 kg), SpO2 97 %, unknown if currently breastfeeding.  Physical Exam:  General: alert, cooperative and no distress Lochia:normal flow Chest: CTAB Heart: RRR no m/r/g Abdomen: +BS, soft, nontender,  Uterine Fundus: firm, 2 below umbilicus DVT Evaluation: No calf swelling or tenderness Extremities: No edema   Recent Labs  06/23/15 0220 06/24/15 0540  HGB 11.8* 10.7*  HCT 34.2* 31.2*    Assessment/Plan:  ASSESSMENT: Sheila Kirby is a 20 y.o. G1P1001 1570w0d s/p NVSD (IOL for PD)  Plan for discharge tomorrow, Breastfeeding and Contraception IUD vs Depo   LOS: 1 day   Olena LeatherwoodKelly M Aguilar 06/24/2015, 9:00 AM   I have seen and examined this patient and agree the above assessment.  Respiratory effort normal, lochia appropriate, legs negative,  pain level normal.  CRESENZO-DISHMAN,Lusia Greis 06/25/2015 10:09 AM

## 2015-06-24 NOTE — Lactation Note (Signed)
This note was copied from a baby's chart. Lactation Consultation Note  Patient Name: Sheila Mitchel HonourMacie Steen ZOXWR'UToday's Date: 06/24/2015 Reason for consult: Follow-up assessment Baby at 26 hr of life. Mom is reporting bilateral nipple soreness and she is using lanolin. Suggested that she use her milk and coconut oil. Asked RN if she would provide the coconut oil for mom. Mom does not think that her milk "has come in yet" and that is why baby is eating so often. Discussed baby behavior, feeding frequency, baby belly size, voids, wt loss, breast changes, and nipple care. Mom stated she can manually express. She is using the NS on the L breast only, baby latches well to the R breast. She is not post pumping the L, suggested she start after each feeding that she uses the NS. She has a Harmony in the room and is agreeable. She is aware of lactation services and support group. She will call as needed.    Maternal Data    Feeding Feeding Type: Breast Fed Length of feed: 10 min  LATCH Score/Interventions                      Lactation Tools Discussed/Used     Consult Status Consult Status: Follow-up Date: 06/25/15 Follow-up type: In-patient    Rulon Eisenmengerlizabeth E Deshawnda Acrey 06/24/2015, 1:03 PM

## 2015-06-25 MED ORDER — SENNOSIDES-DOCUSATE SODIUM 8.6-50 MG PO TABS: 2.0000 | ORAL_TABLET | ORAL | Status: DC

## 2015-06-25 MED ORDER — SIMETHICONE 80 MG PO CHEW: 80.0000 mg | CHEWABLE_TABLET | ORAL | Status: DC | PRN

## 2015-06-25 NOTE — Discharge Summary (Signed)
OB Discharge Summary     Patient Name: Sheila Kirby DOB: [birth year] MRN: [id number]  Date of admission: 06/23/2015 Delivering Kirby: Candelaria StagersGONFA, TAYE T   Date of discharge: 06/25/2015  Admitting diagnosis: INDUCTION Intrauterine pregnancy: 8482w0d     Secondary diagnosis:  Active Problems:   Post term pregnancy, 41 weeks   NSVD (normal spontaneous vaginal delivery)  Additional problems: None     Discharge diagnosis: Term Pregnancy Delivered                                                                                                Post partum procedures:None  Augmentation: Cytotec x 1, then spont labor  Complications: None  Hospital course:  Induction of Labor With Vaginal Delivery     20 y.o. yo G1P1001 at 2182w0d was admitted for IOL due to post term preg on 06/23/2015. Patient had an uncomplicated labor course as follows:  Membrane Rupture Time/Date: 8:30 AM ,06/23/2015   Intrapartum Procedures: Episiotomy: None [1]                                         Lacerations:  Vaginal [6]  Patient had a delivery of a Viable infant. 06/23/2015  Information for the patient's newborn:  Sheila Kirby, Sheila Kirby [[id number]]  Delivery Method: Vaginal, Spontaneous Delivery (Filed from Delivery Summary)    Pateint had an uncomplicated postpartum course.  She is ambulating, tolerating a regular diet, passing flatus, and urinating well. Patient is discharged home in stable condition on 06/25/2015.    Physical exam  Filed Vitals:   06/23/15 2305 06/24/15 0615 06/24/15 1752 06/25/15 0606  BP: 104/60 94/46 110/62 107/70  Pulse: 82 63 61 69  Temp: 98.1 F (36.7 C) 97.9 F (36.6 C) 97.9 F (36.6 C) 97.3 F (36.3 C)  TempSrc: Oral Oral Oral Oral  Resp: 18 17 19 18   Height:      Weight:      SpO2: 97%      General: alert, cooperative and no distress Lochia: appropriate Uterine Fundus: firm Incision: N/A DVT Evaluation: No evidence of DVT seen on physical exam. Labs: Lab Results  Component  Value Date   WBC 10.6* 06/24/2015   HGB 10.7* 06/24/2015   HCT 31.2* 06/24/2015   MCV 86.0 06/24/2015   PLT 172 06/24/2015   No flowsheet data found.  Discharge instruction: per After Visit Summary and "Baby and Me Booklet".  After visit meds:    Medication List    TAKE these medications        calcium carbonate 500 MG chewable tablet  Commonly known as:  TUMS - dosed in mg elemental calcium  Chew 2 tablets by mouth daily as needed for indigestion or heartburn.     prenatal vitamin w/FE, FA 27-1 MG Tabs tablet  Take 1 tablet by mouth daily at 12 noon.     ranitidine 150 MG tablet  Commonly known as:  ZANTAC  Take 150 mg by mouth daily.     senna-docusate  8.6-50 MG tablet  Commonly known as:  Senokot-S  Take 2 tablets by mouth daily.     simethicone 80 MG chewable tablet  Commonly known as:  MYLICON  Chew 1 tablet (80 mg total) by mouth as needed for flatulence.        Diet: routine diet  Activity: Advance as tolerated. Pelvic rest for 6 weeks.   Outpatient follow up:6 weeks Follow up Appt:Future Appointments Date Time Provider Department Center  08/03/2015 3:00 PM Sheila HeritageJacob J Stinson, DO [facility] None   Follow up Visit:No Follow-up on file.  Postpartum contraception: Depo Provera and IUD Mirena  Newborn Data: Live born female  Birth Weight: 7 lb 3.5 oz (3274 g) APGAR: 9, 9  Baby Feeding: Breast Disposition:home with mother   06/25/2015 Sheila Kirby   OB fellow attestation I have seen and examined this patient and agree with above documentation in the resident's note.   Sheila Kirby is a 20 y.o. G1P1001 s/p SVD.   Pain is well controlled.  Plan for birth control is Depo-Provera, IUD.  Method of Feeding: breast  PE:  BP 107/70 mmHg  Pulse 69  Temp(Src) 97.3 F (36.3 C) (Oral)  Resp 18  Ht 5\' 8"  (1.727 m)  Wt 87.998 kg (194 lb)  BMI 29.50 kg/m2  SpO2 97%  LMP  (Approximate)  Breastfeeding? Unknown Fundus firm  No results for input(s):  HGB, HCT in the last 72 hours.   Plan: discharge today - postpartum care discussed - f/u clinic in 6 weeks for postpartum visit   SHAW, KIMBERLY, CNM 11:12 PM

## 2015-06-25 NOTE — Discharge Instructions (Signed)
Medroxyprogesterone injection [Contraceptive] What is this medicine? MEDROXYPROGESTERONE (me DROX ee proe JES te rone) contraceptive injections prevent pregnancy. They provide effective birth control for 3 months. Depo-subQ Provera 104 is also used for treating pain related to endometriosis. This medicine may be used for other purposes; ask your health care provider or pharmacist if you have questions. What should I tell my health care provider before I take this medicine? They need to know if you have any of these conditions: -frequently drink alcohol -asthma -blood vessel disease or a history of a blood clot in the lungs or legs -bone disease such as osteoporosis -breast cancer -diabetes -eating disorder (anorexia nervosa or bulimia) -high blood pressure -HIV infection or AIDS -kidney disease -liver disease -mental depression -migraine -seizures (convulsions) -stroke -tobacco smoker -vaginal bleeding -an unusual or allergic reaction to medroxyprogesterone, other hormones, medicines, foods, dyes, or preservatives -pregnant or trying to get pregnant -breast-feeding How should I use this medicine? Depo-Provera Contraceptive injection is given into a muscle. Depo-subQ Provera 104 injection is given under the skin. These injections are given by a health care professional. You must not be pregnant before getting an injection. The injection is usually given during the first 5 days after the start of a menstrual period or 6 weeks after delivery of a baby. Talk to your pediatrician regarding the use of this medicine in children. Special care may be needed. These injections have been used in female children who have started having menstrual periods. Overdosage: If you think you have taken too much of this medicine contact a poison control center or emergency room at once. NOTE: This medicine is only for you. Do not share this medicine with others. What if I miss a dose? Try not to miss a  dose. You must get an injection once every 3 months to maintain birth control. If you cannot keep an appointment, call and reschedule it. If you wait longer than 13 weeks between Depo-Provera contraceptive injections or longer than 14 weeks between Depo-subQ Provera 104 injections, you could get pregnant. Use another method for birth control if you miss your appointment. You may also need a pregnancy test before receiving another injection. What may interact with this medicine? Do not take this medicine with any of the following medications: -bosentan This medicine may also interact with the following medications: -aminoglutethimide -antibiotics or medicines for infections, especially rifampin, rifabutin, rifapentine, and griseofulvin -aprepitant -barbiturate medicines such as phenobarbital or primidone -bexarotene -carbamazepine -medicines for seizures like ethotoin, felbamate, oxcarbazepine, phenytoin, topiramate -modafinil -St. John's wort This list may not describe all possible interactions. Give your health care provider a list of all the medicines, herbs, non-prescription drugs, or dietary supplements you use. Also tell them if you smoke, drink alcohol, or use illegal drugs. Some items may interact with your medicine. What should I watch for while using this medicine? This drug does not protect you against HIV infection (AIDS) or other sexually transmitted diseases. Use of this product may cause you to lose calcium from your bones. Loss of calcium may cause weak bones (osteoporosis). Only use this product for more than 2 years if other forms of birth control are not right for you. The longer you use this product for birth control the more likely you will be at risk for weak bones. Ask your health care professional how you can keep strong bones. You may have a change in bleeding pattern or irregular periods. Many females stop having periods while taking this drug. If you have  received your  injections on time, your chance of being pregnant is very low. If you think you may be pregnant, see your health care professional as soon as possible. Tell your health care professional if you want to get pregnant within the next year. The effect of this medicine may last a long time after you get your last injection. What side effects may I notice from receiving this medicine? Side effects that you should report to your doctor or health care professional as soon as possible: -allergic reactions like skin rash, itching or hives, swelling of the face, lips, or tongue -breast tenderness or discharge -breathing problems -changes in vision -depression -feeling faint or lightheaded, falls -fever -pain in the abdomen, chest, groin, or leg -problems with balance, talking, walking -unusually weak or tired -yellowing of the eyes or skin Side effects that usually do not require medical attention (report to your doctor or health care professional if they continue or are bothersome): -acne -fluid retention and swelling -headache -irregular periods, spotting, or absent periods -temporary pain, itching, or skin reaction at site where injected -weight gain This list may not describe all possible side effects. Call your doctor for medical advice about side effects. You may report side effects to FDA at 1-800-FDA-1088. Where should I keep my medicine? This does not apply. The injection will be given to you by a health care professional. NOTE: This sheet is a summary. It may not cover all possible information. If you have questions about this medicine, talk to your doctor, pharmacist, or health care provider.    2016, Elsevier/Gold Standard. (2008-01-09 18:37:56) Intrauterine Device Insertion Most often, an intrauterine device (IUD) is inserted into the uterus to prevent pregnancy. There are 2 types of IUDs available:  Copper IUD--This type of IUD creates an environment that is not favorable to sperm  survival. The mechanism of action of the copper IUD is not known for certain. It can stay in place for 10 years.  Hormone IUD--This type of IUD contains the hormone progestin (synthetic progesterone). The progestin thickens the cervical mucus and prevents sperm from entering the uterus, and it also thins the uterine lining. There is no evidence that the hormone IUD prevents implantation. One hormone IUD can stay in place for up to 5 years, and a different hormone IUD can stay in place for up to 3 years. An IUD is the most cost-effective birth control if left in place for the full duration. It may be removed at any time. LET [facility] CARE PROVIDER KNOW ABOUT:  Any allergies you have.  All medicines you are taking, including vitamins, herbs, eye drops, creams, and over-the-counter medicines.  Previous problems you or members of your family have had with the use of anesthetics.  Any blood disorders you have.  Previous surgeries you have had.  Possibility of pregnancy.  Medical conditions you have. RISKS AND COMPLICATIONS  Generally, intrauterine device insertion is a safe procedure. However, as with any procedure, complications can occur. Possible complications include:  Accidental puncture (perforation) of the uterus.  Accidental placement of the IUD either in the muscle layer of the uterus (myometrium) or outside the uterus. If this happens, the IUD can be found essentially floating around the bowels and must be taken out surgically.  The IUD may fall out of the uterus (expulsion). This is more common in women who have recently had a child.   Pregnancy in the fallopian tube (ectopic).  Pelvic inflammatory disease (PID), which is infection of the  uterus and fallopian tubes. The risk of PID is slightly increased in the first 20 days after the IUD is placed, but the overall risk is still very low. BEFORE THE PROCEDURE  Schedule the IUD insertion for when you will have your menstrual  period or right after, to make sure you are not pregnant. Placement of the IUD is better tolerated shortly after a menstrual cycle.  You may need to take tests or be examined to make sure you are not pregnant.  You may be required to take a pregnancy test.  You may be required to get checked for sexually transmitted infections (STIs) prior to placement. Placing an IUD in someone who has an infection can make the infection worse.  You may be given a pain reliever to take 1 or 2 hours before the procedure.  An exam will be performed to determine the size and position of your uterus.  Ask your health care provider about changing or stopping your regular medicines. PROCEDURE   A tool (speculum) is placed in the vagina. This allows your health care provider to see the lower part of the uterus (cervix).  The cervix is prepped with a medicine that lowers the risk of infection.  You may be given a medicine to numb each side of the cervix (intracervical or paracervical block). This is used to block and control any discomfort with insertion.  A tool (uterine sound) is inserted into the uterus to determine the length of the uterine cavity and the direction the uterus may be tilted.  A slim instrument (IUD inserter) is inserted through the cervical canal and into your uterus.  The IUD is placed in the uterine cavity and the insertion device is removed.  The nylon string that is attached to the IUD and used for eventual IUD removal is trimmed. It is trimmed so that it lays high in the vagina, just outside the cervix. AFTER THE PROCEDURE  You may have bleeding after the procedure. This is normal. It varies from light spotting for a few days to menstrual-like bleeding.  You may have mild cramping.   This information is not intended to replace advice given to you by your health care provider. Make sure you discuss any questions you have with your health care provider.   Document Released:  08/16/2010 Document Revised: 10/08/2012 Document Reviewed: 06/08/2012 Elsevier Interactive Patient Education 2016 Elsevier Inc. Postpartum Care After Vaginal Delivery After you deliver your newborn (postpartum period), the usual stay in the hospital is 24-72 hours. If there were problems with your labor or delivery, or if you have other medical problems, you might be in the hospital longer.  While you are in the hospital, you will receive help and instructions on how to care for yourself and your newborn during the postpartum period.  While you are in the hospital:  Be sure to tell your nurses if you have pain or discomfort, as well as where you feel the pain and what makes the pain worse.  If you had an incision made near your vagina (episiotomy) or if you had some tearing during delivery, the nurses may put ice packs on your episiotomy or tear. The ice packs may help to reduce the pain and swelling.  If you are breastfeeding, you may feel uncomfortable contractions of your uterus for a couple of weeks. This is normal. The contractions help your uterus get back to normal size.  It is normal to have some bleeding after delivery.  For the  first 1-3 days after delivery, the flow is red and the amount may be similar to a period.  It is common for the flow to start and stop.  In the first few days, you may pass some small clots. Let your nurses know if you begin to pass large clots or your flow increases.  Do not  flush blood clots down the toilet before having the nurse look at them.  During the next 3-10 days after delivery, your flow should become more watery and pink or brown-tinged in color.  Ten to fourteen days after delivery, your flow should be a small amount of yellowish-white discharge.  The amount of your flow will decrease over the first few weeks after delivery. Your flow may stop in 6-8 weeks. Most women have had their flow stop by 12 weeks after delivery.  You should change  your sanitary pads frequently.  Wash your hands thoroughly with soap and water for at least 20 seconds after changing pads, using the toilet, or before holding or feeding your newborn.  You should feel like you need to empty your bladder within the first 6-8 hours after delivery.  In case you become weak, lightheaded, or faint, call your nurse before you get out of bed for the first time and before you take a shower for the first time.  Within the first few days after delivery, your breasts may begin to feel tender and full. This is called engorgement. Breast tenderness usually goes away within 48-72 hours after engorgement occurs. You may also notice milk leaking from your breasts. If you are not breastfeeding, do not stimulate your breasts. Breast stimulation can make your breasts produce more milk.  Spending as much time as possible with your newborn is very important. During this time, you and your newborn can feel close and get to know each other. Having your newborn stay in your room (rooming in) will help to strengthen the bond with your newborn. It will give you time to get to know your newborn and become comfortable caring for your newborn.  Your hormones change after delivery. Sometimes the hormone changes can temporarily cause you to feel sad or tearful. These feelings should not last more than a few days. If these feelings last longer than that, you should talk to your caregiver.  If desired, talk to your caregiver about methods of family planning or contraception.  Talk to your caregiver about immunizations. Your caregiver may want you to have the following immunizations before leaving the hospital:  Tetanus, diphtheria, and pertussis (Tdap) or tetanus and diphtheria (Td) immunization. It is very important that you and your family (including grandparents) or others caring for your newborn are up-to-date with the Tdap or Td immunizations. The Tdap or Td immunization can help protect  your newborn from getting ill.  Rubella immunization.  Varicella (chickenpox) immunization.  Influenza immunization. You should receive this annual immunization if you did not receive the immunization during your pregnancy.   This information is not intended to replace advice given to you by your health care provider. Make sure you discuss any questions you have with your health care provider.   Document Released: 10/15/2006 Document Revised: 09/12/2011 Document Reviewed: 08/15/2011 Elsevier Interactive Patient Education [employer]2016 Elsevier Inc.

## 2015-06-25 NOTE — Lactation Note (Signed)
This note was copied from a baby's chart. Lactation Consultation Note: Mom reports breast feeding is going much better. Reports her milk came in this morning and baby seems more satisfied after nursing. Baby asleep in bassinet at present. Mom very tired and ready to go home. Reviewed engorgement prevention and treatment. Has Medela pump for home  Reports she is still using the NS on the left breast but will continue working on latching without it. Nipples tender- using EBM and coconut oil on them. Reviewed OP appointments as resource for assist with latch. No questions at present. To call prn  Patient Name: Girl Mitchel HonourMacie Steen ZOXWR'UToday's Date: 06/25/2015 Reason for consult: Follow-up assessment   Maternal Data Formula Feeding for Exclusion: No Has patient been taught Hand Expression?: Yes Does the patient have breastfeeding experience prior to this delivery?: No  Feeding    LATCH Score/Interventions                      Lactation Tools Discussed/Used WIC Program: No   Consult Status      Pamelia HoitWeeks, Markee Matera D 06/25/2015, 9:49 AM

## 2015-07-28 ENCOUNTER — Encounter: Payer: Self-pay | Admitting: Family Medicine

## 2015-08-03 ENCOUNTER — Encounter: Payer: Self-pay | Admitting: Family Medicine

## 2015-08-03 ENCOUNTER — Ambulatory Visit (INDEPENDENT_AMBULATORY_CARE_PROVIDER_SITE_OTHER): Payer: 59 | Admitting: Family Medicine

## 2015-08-03 DIAGNOSIS — Z01812 Encounter for preprocedural laboratory examination: Secondary | ICD-10-CM | POA: Diagnosis not present

## 2015-08-03 DIAGNOSIS — Z30430 Encounter for insertion of intrauterine contraceptive device: Secondary | ICD-10-CM | POA: Diagnosis not present

## 2015-08-03 NOTE — Progress Notes (Signed)
Subjective:     Sheila Kirby is a 20 y.o. female who presents for a postpartum visit. She is 6 weeks postpartum following a spontaneous vaginal delivery. I have fully reviewed the prenatal and intrapartum course. The delivery was at41 gestational weeks. Outcome: spontaneous vaginal delivery. Anesthesia: epidural. Postpartum course has been uneventful. Baby's course has been uneventful. Baby is feeding by breast. Bleeding no bleeding. Bowel function is normal. Bladder function is normal. Patient is not sexually active. Contraception method is none. Postpartum depression screening: negative.  The following portions of the patient's history were reviewed and updated as appropriate: allergies, current medications, past family history, past medical history, past social history, past surgical history and problem list.  Review of Systems Pertinent items are noted in HPI.   Objective:    BP 129/72   Pulse 91   Wt 180 lb (81.6 kg)   BMI 27.37 kg/m   General:  alert, cooperative and no distress  Lungs: clear to auscultation bilaterally  Heart:  regular rate and rhythm, S1, S2 normal, no murmur, click, rub or gallop  Abdomen: soft, non-tender; bowel sounds normal; no masses,  no organomegaly   Vulva:  normal - small pinpoint pimple on right vulva  Vagina: normal vagina, no discharge, exudate, lesion, or erythema  Cervix:  multiparous appearance  Corpus: not examined  Adnexa:  not evaluated  Rectal Exam: Not performed.        Assessment:     Normal postpartum exam. Pap smear not done at today's visit.   Plan:    1. Contraception: IUD - inserted today. 2. Follow up in: 1 month or as needed.      IUD Procedure Note Patient identified, informed consent performed, signed copy in chart, time out was performed.  Urine pregnancy test negative.  Speculum placed in the vagina.  Cervix visualized.  Cleaned with Betadine x 2.  Grasped anteriorly with a single tooth tenaculum.  Uterus sounded to 9  cm.  Liletta  IUD placed per manufacturer's recommendations.  Strings trimmed to 3 cm. Tenaculum was removed, good hemostasis noted.  Patient tolerated procedure well.   Patient given post procedure instructions and Liletta care card with expiration date.  Patient is asked to check IUD strings periodically and follow up in 4-6 weeks for IUD check.

## 2015-08-03 NOTE — Patient Instructions (Signed)

## 2015-09-08 ENCOUNTER — Encounter: Payer: Self-pay | Admitting: Family Medicine

## 2015-09-08 ENCOUNTER — Ambulatory Visit: Payer: 59 | Admitting: Family Medicine

## 2015-09-08 ENCOUNTER — Ambulatory Visit (INDEPENDENT_AMBULATORY_CARE_PROVIDER_SITE_OTHER): Payer: 59 | Admitting: Family Medicine

## 2015-09-08 VITALS — BP 122/76 | HR 87 | Wt 183.0 lb

## 2015-09-08 DIAGNOSIS — Z30431 Encounter for routine checking of intrauterine contraceptive device: Secondary | ICD-10-CM | POA: Diagnosis not present

## 2015-09-08 NOTE — Progress Notes (Signed)
   Subjective:   Patient Name: Sheila Kirby, female   DOB: [birth year], 20 y.o.  MRN: [id number]  HPI Patient here for an IUD check.  She had the Liletta IUD placed 1 month ago.  She reports light bleeding almost every day since placement.  No cramping.  No problems with sex.   Review of Systems  Constitutional: Negative for fever and chills.  Gastrointestinal: Negative for abdominal pain.  Genitourinary: Negative for vaginal discharge, vaginal pain, pelvic pain and dyspareunia.       Objective:   Physical Exam  Constitutional: She appears well-developed and well-nourished.  HENT:  Head: Normocephalic and atraumatic.  Abdominal: Soft. There is no tenderness. There is no guarding.  Genitourinary: There is no rash, tenderness or lesion on the right labia. There is no rash, tenderness or lesion on the left labia. No erythema or tenderness in the vagina. No foreign body around the vagina. No signs of injury around the vagina. No vaginal discharge found.    Skin: Skin is warm and dry.  Psychiatric: She has a normal mood and affect. Her behavior is normal. Judgment and thought content normal.      Assessment & Plan:  1. IUD check up IUD in place.  Patient to call if continues to have bleeding over next 2-4 weeks.  If still bleeding, could give provera x 2 weeks.  Pt to call with any other problems.  Recheck in 1 year.

## 2016-03-01 DIAGNOSIS — H5213 Myopia, bilateral: Secondary | ICD-10-CM | POA: Diagnosis not present

## 2016-04-11 ENCOUNTER — Ambulatory Visit (INDEPENDENT_AMBULATORY_CARE_PROVIDER_SITE_OTHER): Payer: 59 | Admitting: Emergency Medicine

## 2016-04-11 ENCOUNTER — Telehealth: Payer: Self-pay | Admitting: Emergency Medicine

## 2016-04-11 ENCOUNTER — Encounter: Payer: Self-pay | Admitting: Emergency Medicine

## 2016-04-11 VITALS — BP 121/75 | HR 107 | Temp 98.4°F | Ht 68.0 in | Wt 201.4 lb

## 2016-04-11 DIAGNOSIS — R070 Pain in throat: Secondary | ICD-10-CM | POA: Insufficient documentation

## 2016-04-11 DIAGNOSIS — Z20818 Contact with and (suspected) exposure to other bacterial communicable diseases: Secondary | ICD-10-CM | POA: Diagnosis not present

## 2016-04-11 LAB — POCT RAPID STREP A (OFFICE): Rapid Strep A Screen: NEGATIVE

## 2016-04-11 MED ORDER — AZITHROMYCIN 250 MG PO TABS: ORAL_TABLET | ORAL | 0 refills | Status: DC

## 2016-04-11 MED FILL — AZITHROMYCIN 250 MG TABLET: 250 | 5 days supply | Qty: 6 | Fill #0

## 2016-04-11 NOTE — Telephone Encounter (Signed)
Pt seen today. Pt's husband calling to see if it is okay to take Dayquil with Zithromax that was prescribed. Best pt callback [phone].

## 2016-04-11 NOTE — Patient Instructions (Addendum)
     IF you received an x-ray today, you will receive an invoice from [facility]. Please contact [facility] at [phone] with questions or concerns regarding your invoice.   IF you received labwork today, you will receive an invoice from [facility]. Please contact [facility] at [phone] with questions or concerns regarding your invoice.   Our billing staff will not be able to assist you with questions regarding bills from these companies.  You will be contacted with the lab results as soon as they are available. The fastest way to get your results is to activate your My Chart account. Instructions are located on the last page of this paperwork. If you have not heard from us regarding the results in 2 weeks, please contact this office.     Pharyngitis Pharyngitis is a sore throat (pharynx). There is redness, pain, and swelling of your throat. Follow these instructions at home:  Drink enough fluids to keep your pee (urine) clear or pale yellow.  Only take medicine as told by your doctor.  You may get sick again if you do not take medicine as told. Finish your medicines, even if you start to feel better.  Do not take aspirin.  Rest.  Rinse your mouth (gargle) with salt water ( tsp of salt per 1 qt of water) every 1-2 hours. This will help the pain.  If you are not at risk for choking, you can suck on hard candy or sore throat lozenges. Contact a doctor if:  You have large, tender lumps on your neck.  You have a rash.  You cough up green, yellow-brown, or bloody spit. Get help right away if:  You have a stiff neck.  You drool or cannot swallow liquids.  You throw up (vomit) or are not able to keep medicine or liquids down.  You have very bad pain that does not go away with medicine.  You have problems breathing (not from a stuffy nose). This information is not intended to replace advice given to you by your health care provider. Make sure you  discuss any questions you have with your health care provider. Document Released: 06/06/2007 Document Revised: 05/26/2015 Document Reviewed: 08/25/2012 Elsevier Interactive Patient Education  2017 Elsevier Inc.  

## 2016-04-11 NOTE — Progress Notes (Signed)
Sheila Kirby 21 y.o.   Chief Complaint  Patient presents with  . Chills     X 1 day- pt states that she had fever of 100.4 today  . Sore Throat    X 2 days  . Cough    X 1 day    HISTORY OF PRESENT ILLNESS: This is a 21 y.o. female complaining of sore throat x 2 days; exposed to sister's strep throat.  Sore Throat   This is a new problem. The current episode started in the past 7 days. The problem has been gradually worsening. There has been no fever. The pain is mild. Associated symptoms include coughing. Pertinent negatives include no abdominal pain, congestion, diarrhea, ear discharge, ear pain, headaches, hoarse voice, neck pain, shortness of breath, swollen glands, trouble swallowing or vomiting. She has had exposure to strep. She has tried nothing for the symptoms.     Prior to Admission medications   Medication Sig Start Date End Date Taking? Authorizing Provider  calcium carbonate (TUMS - DOSED IN MG ELEMENTAL CALCIUM) 500 MG chewable tablet Chew 2 tablets by mouth daily as needed for indigestion or heartburn.    Historical Provider, MD  prenatal vitamin w/FE, FA (PRENATAL 1 + 1) 27-1 MG TABS tablet Take 1 tablet by mouth daily at 12 noon.    Historical Provider, MD  ranitidine (ZANTAC) 150 MG tablet Take 150 mg by mouth daily.    Historical Provider, MD  senna-docusate (SENOKOT-S) 8.6-50 MG tablet Take 2 tablets by mouth daily. Patient not taking: Reported on 08/03/2015 06/25/15   Olena Leatherwood, MD  simethicone (MYLICON) 80 MG chewable tablet Chew 1 tablet (80 mg total) by mouth as needed for flatulence. Patient not taking: Reported on 08/03/2015 06/25/15   Olena Leatherwood, MD    Allergies  Allergen Reactions  . Amoxicillin Other (See Comments)    Stomach pains. Rash Has patient had a PCN reaction causing immediate rash, facial/tongue/throat swelling, SOB or lightheadedness with hypotension: No Has patient had a PCN reaction causing severe rash involving mucus membranes or  skin necrosis: No Has patient had a PCN reaction that required hospitalization No Has patient had a PCN reaction occurring within the last 10 years: no If all of the above answers are "NO", then may proceed with Cephalosporin use.     Patient Active Problem List   Diagnosis Date Noted  . Family history of spina bifida 11/15/2014    Past Medical History:  Diagnosis Date  . Medical history non-contributory     Past Surgical History:  Procedure Laterality Date  . NO PAST SURGERIES    . WISDOM TOOTH EXTRACTION      Social History   Social History  . Marital status: Single    Spouse name: N/A  . Number of children: N/A  . Years of education: N/A   Occupational History  . Not on file.   Social History Main Topics  . Smoking status: Never Smoker  . Smokeless tobacco: Never Used  . Alcohol use No  . Drug use: No  . Sexual activity: Yes   Other Topics Concern  . Not on file   Social History Narrative  . No narrative on file    Family History  Problem Relation Age of Onset  . Hypertension Mother   . Diabetes Father   . Hypertension Father   . Cancer Sister     skin cancer  . Cancer Maternal Grandfather     skin  .  Cancer Paternal Grandfather     renal  . Spina bifida Paternal Uncle   . Stroke Neg Hx   . Miscarriages / Stillbirths Neg Hx   . Mental retardation Neg Hx      Review of Systems  Constitutional: Positive for chills. Negative for fever and malaise/fatigue.  HENT: Negative for congestion, ear discharge, ear pain, hoarse voice, nosebleeds, sinus pain and trouble swallowing.   Eyes: Negative for discharge and redness.  Respiratory: Positive for cough. Negative for hemoptysis and shortness of breath.   Gastrointestinal: Negative for abdominal pain, diarrhea, nausea and vomiting.  Genitourinary: Negative for dysuria, frequency and hematuria.  Musculoskeletal: Negative for myalgias and neck pain.  Skin: Negative for rash.  Neurological: Negative for  dizziness, sensory change, focal weakness and headaches.  Endo/Heme/Allergies: Negative.   All other systems reviewed and are negative.  Vitals:   04/11/16 1106  BP: 121/75  Pulse: (!) 107  Temp: 98.4 F (36.9 C)     Physical Exam  Constitutional: She is oriented to person, place, and time. She appears well-developed and well-nourished.  HENT:  Head: Normocephalic and atraumatic.  Right Ear: External ear normal.  Left Ear: External ear normal.  Nose: Nose normal.  Mouth/Throat: Uvula is midline. Posterior oropharyngeal erythema present. No oropharyngeal exudate.  Eyes: Conjunctivae and EOM are normal. Pupils are equal, round, and reactive to light.  Neck: Normal range of motion. Neck supple. No JVD present. No thyromegaly present.  Cardiovascular: Normal rate, regular rhythm and normal heart sounds.   Pulmonary/Chest: Effort normal and breath sounds normal.  Abdominal: Soft. Bowel sounds are normal. She exhibits no distension. There is no tenderness.  Musculoskeletal: Normal range of motion.  Lymphadenopathy:    She has no cervical adenopathy.  Neurological: She is alert and oriented to person, place, and time. No sensory deficit. She exhibits normal muscle tone.  Skin: Skin is warm and dry. Capillary refill takes less than 2 seconds. No rash noted.  Psychiatric: She has a normal mood and affect. Her behavior is normal.  Vitals reviewed.    ASSESSMENT & PLAN: Helmi was seen today for chills, sore throat and cough.  Diagnoses and all orders for this visit:  Pain in throat -     POCT rapid strep A -     Culture, Group A Strep  Strep throat exposure  Other orders -     azithromycin (ZITHROMAX) 250 MG tablet; Sig as indicated    Patient Instructions       IF you received an x-ray today, you will receive an invoice from [facility]. Please contact [facility] at [phone] with questions or concerns regarding your invoice.   IF you received  labwork today, you will receive an invoice from [place]. Please contact [facility] at [phone] with questions or concerns regarding your invoice.   Our billing staff will not be able to assist you with questions regarding bills from these companies.  You will be contacted with the lab results as soon as they are available. The fastest way to get your results is to activate your My Chart account. Instructions are located on the last page of this paperwork. If you have not heard from [place] regarding the results in 2 weeks, please contact this office.      Pharyngitis Pharyngitis is a sore throat (pharynx). There is redness, pain, and swelling of your throat. Follow these instructions at home:  Drink enough fluids to keep your pee (urine) clear or pale yellow.  Only  take medicine as told by your doctor.  You may get sick again if you do not take medicine as told. Finish your medicines, even if you start to feel better.  Do not take aspirin.  Rest.  Rinse your mouth (gargle) with salt water ( tsp of salt per 1 qt of water) every 1-2 hours. This will help the pain.  If you are not at risk for choking, you can suck on hard candy or sore throat lozenges. Contact a doctor if:  You have large, tender lumps on your neck.  You have a rash.  You cough up green, yellow-brown, or bloody spit. Get help right away if:  You have a stiff neck.  You drool or cannot swallow liquids.  You throw up (vomit) or are not able to keep medicine or liquids down.  You have very bad pain that does not go away with medicine.  You have problems breathing (not from a stuffy nose). This information is not intended to replace advice given to you by your health care provider. Make sure you discuss any questions you have with your health care provider. Document Released: 06/06/2007 Document Revised: 05/26/2015 Document Reviewed: 08/25/2012 Elsevier Interactive Patient Education  2017 Elsevier  Inc.      Edwina Barth, MD [facility]

## 2016-04-11 NOTE — Telephone Encounter (Signed)
Advised it is ok, check with pharmacist to make sure she is not using any other meds related to nyquil for illness now.

## 2016-04-13 LAB — CULTURE, GROUP A STREP: STREP A CULTURE: NEGATIVE

## 2016-09-27 ENCOUNTER — Encounter: Payer: Self-pay | Admitting: Family Medicine

## 2016-09-27 ENCOUNTER — Ambulatory Visit (INDEPENDENT_AMBULATORY_CARE_PROVIDER_SITE_OTHER): Payer: 59 | Admitting: Family Medicine

## 2016-09-27 VITALS — BP 140/85 | HR 102 | Ht 68.0 in | Wt 203.0 lb

## 2016-09-27 DIAGNOSIS — Z124 Encounter for screening for malignant neoplasm of cervix: Secondary | ICD-10-CM | POA: Diagnosis not present

## 2016-09-27 DIAGNOSIS — Z30432 Encounter for removal of intrauterine contraceptive device: Secondary | ICD-10-CM | POA: Diagnosis not present

## 2016-09-27 NOTE — Progress Notes (Signed)
Patient desires IUD removal. Is contemplating getting pregnant. Also, finding that she is having a little more cramping and is a quite emotional lately. Hasn't had any periods since placement. Discussed other contraception - pt defers for now. PAP done today.  IUD Removal  Patient was in the dorsal lithotomy position, normal external genitalia was noted.  A speculum was placed in the patient's vagina, normal discharge was noted, no lesions. The multiparous cervix was visualized, no lesions, no abnormal discharge,  and was swabbed with Betadine using scopettes.  The strings of the IUD was grasped and pulled using ring forceps.  The IUD was successfully removed in its entirety.  Patient tolerated the procedure well.

## 2016-10-01 LAB — CYTOLOGY - PAP: Diagnosis: NEGATIVE

## 2016-10-24 MED FILL — AZITHROMYCIN 250 MG TABLET: 250 | 5 days supply | Qty: 6 | Fill #0

## 2016-10-25 ENCOUNTER — Ambulatory Visit (INDEPENDENT_AMBULATORY_CARE_PROVIDER_SITE_OTHER): Payer: 59 | Admitting: Family Medicine

## 2016-10-25 ENCOUNTER — Encounter: Payer: Self-pay | Admitting: Family Medicine

## 2016-10-25 VITALS — BP 130/78 | HR 93 | Ht 68.0 in | Wt 201.0 lb

## 2016-10-25 DIAGNOSIS — J019 Acute sinusitis, unspecified: Secondary | ICD-10-CM | POA: Diagnosis not present

## 2016-10-25 NOTE — Progress Notes (Signed)
Patient complaining of head cold that has lasted for five days.

## 2016-10-25 NOTE — Progress Notes (Signed)
   Subjective:    Patient ID: Sheila Kirby, female    DOB: [birth year], 21 y.o.   MRN: [id number]  HPI C/o sinus congestion with purulent nasal drainage. No fever. Some chest congestion as well. Has been taking mucinex occasionally. 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   Review of Systems     Objective:   Physical Exam  Constitutional: She appears well-developed and well-nourished.  HENT:  Head: Normocephalic and atraumatic.  Right Ear: External ear normal.  Left Ear: External ear normal.  Nose: Rhinorrhea present. Right sinus exhibits no maxillary sinus tenderness and no frontal sinus tenderness. Left sinus exhibits no maxillary sinus tenderness and no frontal sinus tenderness.  Mouth/Throat: Posterior oropharyngeal erythema present. No oropharyngeal exudate or tonsillar abscesses.  Eyes: Pupils are equal, round, and reactive to light. Conjunctivae are normal.  Neck: Normal range of motion. Neck supple.  Cardiovascular: Normal rate, regular rhythm and normal heart sounds.   Pulmonary/Chest: Effort normal and breath sounds normal. No respiratory distress. She has no wheezes. She has no rales.      Assessment & Plan:  1. Acute sinusitis, recurrence not specified, unspecified location Take zpac. Mucinex DM. Ibuprofen 600mg  q 6 hrs.

## 2017-01-01 NOTE — L&D Delivery Note (Signed)
Delivery Note At 1:39 AM a viable female was delivered via Vaginal, Spontaneous (Presentation: vertex; ROA).  APGAR: 8, 9; weight  .   Placenta status: spontaneous, intact.  Cord: 3vc with the following complications: none.  Cord pH: n/a  Anesthesia:  epidural Episiotomy: None Lacerations: Labial Suture Repair: 3.0 vicryl Est. Blood Loss (mL):   Mom to postpartum.  Baby to Couplet care / Skin to Skin.  Sheila Kirby 11/06/2017, 1:58 AM

## 2017-02-04 ENCOUNTER — Ambulatory Visit (INDEPENDENT_AMBULATORY_CARE_PROVIDER_SITE_OTHER): Payer: 59

## 2017-02-04 ENCOUNTER — Telehealth: Payer: Self-pay

## 2017-02-04 VITALS — BP 138/70 | HR 98 | Ht 68.0 in | Wt 200.0 lb

## 2017-02-04 DIAGNOSIS — Z3202 Encounter for pregnancy test, result negative: Secondary | ICD-10-CM

## 2017-02-04 DIAGNOSIS — N912 Amenorrhea, unspecified: Secondary | ICD-10-CM | POA: Diagnosis not present

## 2017-02-04 LAB — POCT URINE PREGNANCY: PREG TEST UR: NEGATIVE

## 2017-02-04 NOTE — Progress Notes (Addendum)
Patient presents for urine pregnancy test. Patient states she is a week late for her period but has had negative pregnancy tests at home.  Patient sent to lab for HCG blood draw since poct urine pregnancy test was negative. Will call with results and discuss plan of care once HCG resulted. Armandina StammerJennifer Sabastion Hrdlicka RN

## 2017-02-04 NOTE — Telephone Encounter (Signed)
Patient states that she is one week late for her period but has not had a positive home pregnancy test. Patient states she is nervous and would like to come in for pregnancy test here in office. Patient scheduled for nurse visit today at 11:30. Armandina StammerJennifer Howard RN BSN

## 2017-02-05 ENCOUNTER — Telehealth: Payer: Self-pay

## 2017-02-05 LAB — BETA HCG QUANT (REF LAB): hCG Quant: <1 m[IU]/mL

## 2017-02-05 NOTE — Telephone Encounter (Signed)
Patient called and made aware that her HCG was negative.  Patient states she still has not started her period and she is a week late. Patient anxious about getting pregnant.  Patient states her periods have been regular since having the IUD removed. Patient given option to come and speak with provider but states she will wait and see what her period does and call if she would like to schedule an appointment. Armandina StammerJennifer Rudene Poulsen RN

## 2017-02-22 MED FILL — IBUPROFEN 600 MG TABLET: 600 | 3 days supply | Qty: 40 | Fill #0

## 2017-02-22 MED FILL — HYDROCODON-APAP 5-325: 5-325 | 3 days supply | Qty: 20 | Fill #0

## 2017-03-25 ENCOUNTER — Encounter: Payer: Self-pay | Admitting: Family Medicine

## 2017-03-25 ENCOUNTER — Ambulatory Visit (INDEPENDENT_AMBULATORY_CARE_PROVIDER_SITE_OTHER): Payer: 59 | Admitting: Family Medicine

## 2017-03-25 VITALS — BP 124/71 | HR 80 | Wt 203.0 lb

## 2017-03-25 DIAGNOSIS — Z3480 Encounter for supervision of other normal pregnancy, unspecified trimester: Secondary | ICD-10-CM

## 2017-03-25 DIAGNOSIS — Z3481 Encounter for supervision of other normal pregnancy, first trimester: Secondary | ICD-10-CM

## 2017-03-25 DIAGNOSIS — Z113 Encounter for screening for infections with a predominantly sexual mode of transmission: Secondary | ICD-10-CM | POA: Diagnosis not present

## 2017-03-25 DIAGNOSIS — Z3687 Encounter for antenatal screening for uncertain dates: Secondary | ICD-10-CM | POA: Diagnosis not present

## 2017-03-25 DIAGNOSIS — Z8279 Family history of other congenital malformations, deformations and chromosomal abnormalities: Secondary | ICD-10-CM

## 2017-03-25 MED ORDER — ONDANSETRON HCL 4 MG PO TABS: 4.0000 mg | ORAL_TABLET | Freq: Three times a day (TID) | ORAL | 5 refills | Status: DC | PRN

## 2017-03-25 MED FILL — ONDANSETRON HCL 4 MG TABLET: 4 | 7 days supply | Qty: 20 | Fill #0

## 2017-03-25 NOTE — Progress Notes (Signed)
Patient requesting script for nausea.Patient states she has had bleeding for Friday and Saturday and some cramping.  For dating purposes patient states she had a light period that lasted two days on 02/04/17 and had a regular period on 01/01/17  Armandina StammerJennifer Kamaiyah Uselton RNBSN   DATING AND VIABILITY SONOGRAM   Sheila Kirby is a 22 y.o. year old G2P1001 with LMP Patient's last menstrual period was 02/04/2017. which would correlate to  2451w0d weeks gestation.  She has regular menstrual cycles.   She is here today for a confirmatory initial sonogram.    GESTATION: SINGLETON     FETAL ACTIVITY:          Heart rate         132 bpm   GESTATIONAL AGE AND  BIOMETRICS:  Gestational criteria: Estimated Date of Delivery: 11/11/17 by early ultrasound now at 2351w0d  Previous Scans:0  GESTATIONAL SAC           2.24 cm         7-1 weeks  CROWN RUMP LENGTH           0.887 cm         6-6 weeks                                                                               AVERAGE EGA(BY THIS SCAN):  7-0 weeks  WORKING EDD( early ultrasound ):  11-11-17     TECHNICIAN COMMENTS: Patient reported some vaginal bleeding for two days. Armandina StammerJennifer Leron Stoffers 03/25/2017 10:29 AM

## 2017-03-25 NOTE — Progress Notes (Signed)
Subjective:  Sheila Kirby is a G2P1001 [redacted]w[redacted]d being seen today for her first obstetrical visit.  Her obstetrical history is significant for low risk pregnancy. FOB: Clifton Custard, married to patient. Planned pregnancy. Patient does intend to breast feed. Pregnancy history fully reviewed.  Patient reports nausea and vomiting. Had some bleeding after intercourse.  BP 124/71   Pulse 80   Wt 203 lb (92.1 kg)   LMP 02/04/2017   BMI 30.87 kg/m   HISTORY: OB History  Gravida Para Term Preterm AB Living  2 1 1     1   SAB TAB Ectopic Multiple Live Births        0 1    # Outcome Date GA Lbr Len/2nd Weight Sex Delivery Anes PTL Lv  2 Current           1 Term 06/23/15 [redacted]w[redacted]d 03:15 / 01:38 7 lb 3.5 oz (3.274 kg) F Vag-Spont EPI  LIV    Past Medical History:  Diagnosis Date  . Medical history non-contributory     Past Surgical History:  Procedure Laterality Date  . NO PAST SURGERIES    . WISDOM TOOTH EXTRACTION      Family History  Problem Relation Age of Onset  . Hypertension Mother   . Diabetes Father   . Hypertension Father   . Cancer Sister        skin cancer  . Cancer Maternal Grandfather        skin  . Cancer Paternal Grandfather        renal  . Spina bifida Paternal Uncle   . Stroke Neg Hx   . Miscarriages / Stillbirths Neg Hx   . Mental retardation Neg Hx      Exam    Uterus:     Pelvic Exam:    Perineum: No Hemorrhoids, Normal Perineum   Vulva: normal, Bartholin's, Urethra, Skene's normal   Vagina:  normal mucosa   Cervix: multiparous appearance, no cervical motion tenderness and no lesions   Adnexa: normal adnexa and no mass, fullness, tenderness   Bony Pelvis: gynecoid  System: Breast:  Exam declined by patient   Skin: normal coloration and turgor, no rashes    Neurologic: gait normal; reflexes normal and symmetric   Extremities: normal strength, tone, and muscle mass, no deformities, no erythema, induration, or nodules   HEENT PERRLA and extra ocular  movement intact   Mouth/Teeth mucous membranes moist, pharynx normal without lesions   Neck supple and no masses   Cardiovascular: regular rate and rhythm, no murmurs or gallops   Respiratory:  appears well, vitals normal, no respiratory distress, acyanotic, normal RR, ear and throat exam is normal, neck free of mass or lymphadenopathy, chest clear, no wheezing, crepitations, rhonchi, normal symmetric air entry   Abdomen: soft, non-tender; bowel sounds normal; no masses,  no organomegaly   Urinary: urethral meatus normal      Assessment:    Pregnancy: G2P1001 Patient Active Problem List   Diagnosis Date Noted  . Strep throat exposure 04/11/2016  . Pain in throat 04/11/2016  . Supervision of other normal pregnancy, antepartum 11/15/2014  . Family history of spina bifida 11/15/2014      Plan:   1. Supervision of other normal pregnancy, antepartum Quad screen desired Zofran for nausea and vomiting. - Obstetric Panel, Including HIV - Urine Culture - GC/Chlamydia probe amp ([place])not at [facility] - [place] MFM Fetal Nuchal Translucency; Future  2. Family hx of spina bifida. Requests quad screen  Problem  list reviewed and updated. 75% of 45 min visit spent on counseling and coordination of care.     Levie HeritageJacob J Stinson 03/25/2017

## 2017-03-27 ENCOUNTER — Encounter: Payer: Self-pay | Admitting: Family Medicine

## 2017-03-27 DIAGNOSIS — R8271 Bacteriuria: Secondary | ICD-10-CM | POA: Insufficient documentation

## 2017-03-27 LAB — OBSTETRIC PANEL, INCLUDING HIV
ANTIBODY SCREEN: NEGATIVE
BASOS: 0 %
Basophils Absolute: 0 10*3/uL (ref 0.0–0.2)
EOS (ABSOLUTE): 0 10*3/uL (ref 0.0–0.4)
EOS: 0 %
HEMATOCRIT: 40.8 % (ref 34.0–46.6)
HEMOGLOBIN: 13.9 g/dL (ref 11.1–15.9)
HIV Screen 4th Generation wRfx: NONREACTIVE
Hepatitis B Surface Ag: NEGATIVE
IMMATURE GRANS (ABS): 0 10*3/uL (ref 0.0–0.1)
Immature Granulocytes: 0 %
LYMPHS: 21 %
Lymphocytes Absolute: 1.9 10*3/uL (ref 0.7–3.1)
MCH: 28.9 pg (ref 26.6–33.0)
MCHC: 34.1 g/dL (ref 31.5–35.7)
MCV: 85 fL (ref 79–97)
MONOCYTES: 5 %
Monocytes Absolute: 0.4 10*3/uL (ref 0.1–0.9)
NEUTROS ABS: 6.6 10*3/uL (ref 1.4–7.0)
Neutrophils: 74 %
Platelets: 275 10*3/uL (ref 150–379)
RBC: 4.81 x10E6/uL (ref 3.77–5.28)
RDW: 13.5 % (ref 12.3–15.4)
RH TYPE: POSITIVE
RPR Ser Ql: NONREACTIVE
RUBELLA: 1.88 {index} (ref >0.99)
WBC: 8.9 10*3/uL (ref 3.4–10.8)

## 2017-03-27 LAB — GC/CHLAMYDIA PROBE AMP (~~LOC~~) NOT AT ARMC
Chlamydia: NEGATIVE
NEISSERIA GONORRHEA: NEGATIVE

## 2017-03-27 LAB — URINE CULTURE

## 2017-04-08 ENCOUNTER — Encounter: Payer: 59 | Admitting: Family Medicine

## 2017-04-29 ENCOUNTER — Encounter: Payer: Self-pay | Admitting: Family Medicine

## 2017-04-29 ENCOUNTER — Ambulatory Visit (INDEPENDENT_AMBULATORY_CARE_PROVIDER_SITE_OTHER): Payer: 59 | Admitting: Family Medicine

## 2017-04-29 VITALS — BP 140/82 | HR 111 | Wt 201.0 lb

## 2017-04-29 DIAGNOSIS — Z3480 Encounter for supervision of other normal pregnancy, unspecified trimester: Secondary | ICD-10-CM

## 2017-04-29 DIAGNOSIS — R8271 Bacteriuria: Secondary | ICD-10-CM

## 2017-04-29 DIAGNOSIS — Z8279 Family history of other congenital malformations, deformations and chromosomal abnormalities: Secondary | ICD-10-CM

## 2017-04-29 DIAGNOSIS — Z3481 Encounter for supervision of other normal pregnancy, first trimester: Secondary | ICD-10-CM

## 2017-04-29 NOTE — Progress Notes (Signed)
   PRENATAL VISIT NOTE  Subjective:  Sheila Kirby is a 22 y.o. G2P1001 at [redacted]w[redacted]d being seen today for ongoing prenatal care.  She is currently monitored for the following issues for this low-risk pregnancy and has Supervision of other normal pregnancy, antepartum; Family history of spina bifida; Strep throat exposure; Pain in throat; and GBS bacteriuria on their problem list.  Patient reports no complaints.   . Vag. Bleeding: None.   . Denies leaking of fluid.   The following portions of the patient's history were reviewed and updated as appropriate: allergies, current medications, past family history, past medical history, past social history, past surgical history and problem list. Problem list updated.  Objective:   Vitals:   04/29/17 0831  BP: 140/82  Pulse: (!) 111  Weight: 201 lb (91.2 kg)    Fetal Status:           General:  Alert, oriented and cooperative. Patient is in no acute distress.  Skin: Skin is warm and dry. No rash noted.   Cardiovascular: Normal heart rate noted  Respiratory: Normal respiratory effort, no problems with respiration noted  Abdomen: Soft, gravid, appropriate for gestational age.  Pain/Pressure: Present     Pelvic: Cervical exam deferred        Extremities: Normal range of motion.  Edema: None  Mental Status: Normal mood and affect. Normal behavior. Normal judgment and thought content.   Assessment and Plan:  Pregnancy: G2P1001 at [redacted]w[redacted]d  1. Supervision of other normal pregnancy, antepartum FHT and FH normal  2. GBS bacteriuria Intrapartum prophylaxis  3. Family history of spina bifida Quad screen around 20 weeks  Preterm labor symptoms and general obstetric precautions including but not limited to vaginal bleeding, contractions, leaking of fluid and fetal movement were reviewed in detail with the patient. Please refer to After Visit Summary for other counseling recommendations.  Return in about 1 month (around 05/27/2017) for OB  f/u.  Future Appointments  Date Time Provider Department Center  05/07/2017  7:45 AM [facility] [place] 2 [facility]  05/07/2017  9:00 AM [facility] LAB [facility]    [name], DO

## 2017-05-07 ENCOUNTER — Ambulatory Visit (HOSPITAL_COMMUNITY): Payer: 59

## 2017-05-30 ENCOUNTER — Ambulatory Visit (INDEPENDENT_AMBULATORY_CARE_PROVIDER_SITE_OTHER): Payer: 59 | Admitting: Family Medicine

## 2017-05-30 VITALS — BP 117/66 | HR 92 | Wt 200.0 lb

## 2017-05-30 DIAGNOSIS — Z3480 Encounter for supervision of other normal pregnancy, unspecified trimester: Secondary | ICD-10-CM

## 2017-05-30 DIAGNOSIS — M549 Dorsalgia, unspecified: Secondary | ICD-10-CM | POA: Diagnosis not present

## 2017-05-30 DIAGNOSIS — M9901 Segmental and somatic dysfunction of cervical region: Secondary | ICD-10-CM

## 2017-05-30 DIAGNOSIS — R519 Headache, unspecified: Secondary | ICD-10-CM

## 2017-05-30 DIAGNOSIS — R51 Headache: Secondary | ICD-10-CM

## 2017-05-30 DIAGNOSIS — O9989 Other specified diseases and conditions complicating pregnancy, childbirth and the puerperium: Secondary | ICD-10-CM

## 2017-05-30 NOTE — Progress Notes (Signed)
   PRENATAL VISIT NOTE  Subjective:  Sheila Kirby is a 22 y.o. G2P1001 at [redacted]w[redacted]d being seen today for ongoing prenatal care.  She is currently monitored for the following issues for this low-risk pregnancy and has Supervision of other normal pregnancy, antepartum; Family history of spina bifida; Strep throat exposure; Pain in throat; and GBS bacteriuria on their problem list.  Patient reports backache and headache. Has daily headaches. Takes tylenol with improvement. Was getting milder HA with IUD.  Contractions: Not present. Vag. Bleeding: None.  Movement: Absent. Denies leaking of fluid.   The following portions of the patient's history were reviewed and updated as appropriate: allergies, current medications, past family history, past medical history, past social history, past surgical history and problem list. Problem list updated.  Objective:   Vitals:   05/30/17 0837  BP: 117/66  Pulse: 92  Weight: 200 lb (90.7 kg)    Fetal Status: Fetal Heart Rate (bpm): 143   Movement: Absent     General:  Alert, oriented and cooperative. Patient is in no acute distress.  Skin: Skin is warm and dry. No rash noted.   Cardiovascular: Normal heart rate noted  Respiratory: Normal respiratory effort, no problems with respiration noted  Abdomen: Soft, gravid, appropriate for gestational age. Pain/Pressure: Present     Pelvic:  Cervical exam deferred        MSK: Restriction, tenderness, tissue texture changes, and paraspinal spasm in the cervical and lumbar spine  Neuro: Moves all four extremities with no focal neurological deficit  Extremities: Normal range of motion.  Edema: None  Mental Status: Normal mood and affect. Normal behavior. Normal judgment and thought content.   OSE: Head OA FSLRR  Cervical C3 FSRR  Thoracic T2 FSRL, T4 FSRR, T8 FSRR  Rib T8 inhaled on right  Lumbar L5 ESRL, L1 ESRR  Sacrum L/L  Pelvis Right ant    Assessment and Plan:  Pregnancy: G2P1001 at [redacted]w[redacted]d  1.  Supervision of other normal pregnancy, antepartum FHT and FH normal - [place] MFM OB COMP + 14 WK; Future  2. Back pain affecting pregnancy in second trimester 3. Chronic nonintractable headache, unspecified headache type 4. Somatic dysfunction of cervical region OMT done after patient permission. HVLA technique utilized. 7 areas treated with improvement of tissue texture and joint mobility. Patient tolerated procedure well.     Preterm labor symptoms and general obstetric precautions including but not limited to vaginal bleeding, contractions, leaking of fluid and fetal movement were reviewed in detail with the patient. Please refer to After Visit Summary for other counseling recommendations.  Return in about 1 month (around 06/27/2017) for OB f/u, Quad screen.  [name], DO

## 2017-06-17 ENCOUNTER — Encounter (HOSPITAL_COMMUNITY): Payer: Self-pay

## 2017-06-24 ENCOUNTER — Ambulatory Visit (HOSPITAL_COMMUNITY)
Admission: RE | Admit: 2017-06-24 | Discharge: 2017-06-24 | Disposition: A | Discharge status: Home / Self Care | Payer: 59 | Source: Ambulatory Visit | Attending: Family Medicine | Admitting: Family Medicine

## 2017-06-24 ENCOUNTER — Other Ambulatory Visit: Payer: Self-pay | Admitting: Family Medicine

## 2017-06-24 DIAGNOSIS — Z3A20 20 weeks gestation of pregnancy: Secondary | ICD-10-CM

## 2017-06-24 DIAGNOSIS — Z363 Encounter for antenatal screening for malformations: Secondary | ICD-10-CM

## 2017-06-24 DIAGNOSIS — Z3480 Encounter for supervision of other normal pregnancy, unspecified trimester: Secondary | ICD-10-CM

## 2017-06-28 ENCOUNTER — Ambulatory Visit (INDEPENDENT_AMBULATORY_CARE_PROVIDER_SITE_OTHER): Payer: 59 | Admitting: Family Medicine

## 2017-06-28 VITALS — BP 118/58 | HR 90 | Wt 200.0 lb

## 2017-06-28 DIAGNOSIS — Z3490 Encounter for supervision of normal pregnancy, unspecified, unspecified trimester: Secondary | ICD-10-CM | POA: Diagnosis not present

## 2017-06-28 DIAGNOSIS — Z3480 Encounter for supervision of other normal pregnancy, unspecified trimester: Secondary | ICD-10-CM

## 2017-06-28 NOTE — Progress Notes (Signed)
   PRENATAL VISIT NOTE  Subjective:  Sheila Kirby is a 22 y.o. G2P1001 at 8569w4d being seen today for ongoing prenatal care.  She is currently monitored for the following issues for this low-risk pregnancy and has Supervision of other normal pregnancy, antepartum; Family history of spina bifida; Strep throat exposure; Pain in throat; and GBS bacteriuria on their problem list.  Patient reports no complaints.  Contractions: Not present. Vag. Bleeding: None.  Movement: Present. Denies leaking of fluid.   The following portions of the patient's history were reviewed and updated as appropriate: allergies, current medications, past family history, past medical history, past social history, past surgical history and problem list. Problem list updated.  Objective:   Vitals:   06/28/17 1038  BP: (!) 118/58  Pulse: 90  Weight: 200 lb (90.7 kg)    Fetal Status: Fetal Heart Rate (bpm): 138 Fundal Height: 20 cm Movement: Present     General:  Alert, oriented and cooperative. Patient is in no acute distress.  Skin: Skin is warm and dry. No rash noted.   Cardiovascular: Normal heart rate noted  Respiratory: Normal respiratory effort, no problems with respiration noted  Abdomen: Soft, gravid, appropriate for gestational age.  Pain/Pressure: Present     Pelvic: Cervical exam deferred        Extremities: Normal range of motion.  Edema: None  Mental Status: Normal mood and affect. Normal behavior. Normal judgment and thought content.   Assessment and Plan:  Pregnancy: G2P1001 at 3369w4d  1. Supervision of other normal pregnancy, antepartum FHT and FH normal  2. Prenatal care, antepartum - AFP TETRA  Preterm labor symptoms and general obstetric precautions including but not limited to vaginal bleeding, contractions, leaking of fluid and fetal movement were reviewed in detail with the patient. Please refer to After Visit Summary for other counseling recommendations.  Return in about 1 month (around  07/26/2017) for OB f/u.  Future Appointments  Date Time Provider Department Center  07/31/2017  4:00 PM Willodean RosenthalHarraway-Smith, Carolyn, MD [facility] None    Levie HeritageJacob J Tayon Parekh, DO

## 2017-06-30 LAB — AFP TETRA
DIA MOM VALUE: 1.35
DIA Value (EIA): 240.29 pg/mL
DSR (BY AGE) 1 IN: 1084
DSR (Second Trimester) 1 IN: 5169
Gestational Age: 20.4 WEEKS
MSAFP Mom: 1.2
MSAFP: 58.3 ng/mL
MSHCG MOM: 1.23
MSHCG: 24575 m[IU]/mL
Maternal Age At EDD: 23.6 yr
Osb Risk: 6499
Test Results:: NEGATIVE
UE3 MOM: 1.13
UE3 VALUE: 2.26 ng/mL
Weight: 200 [lb_av]

## 2017-07-01 ENCOUNTER — Encounter (INDEPENDENT_AMBULATORY_CARE_PROVIDER_SITE_OTHER): Payer: Self-pay

## 2017-07-08 ENCOUNTER — Encounter (HOSPITAL_COMMUNITY): Payer: Self-pay | Admitting: *Deleted

## 2017-07-08 ENCOUNTER — Inpatient Hospital Stay (HOSPITAL_COMMUNITY)
Admission: AD | Admit: 2017-07-08 | Discharge: 2017-07-08 | Disposition: A | Discharge status: Home / Self Care | Payer: 59 | Source: Ambulatory Visit | Attending: Obstetrics and Gynecology | Admitting: Obstetrics and Gynecology

## 2017-07-08 ENCOUNTER — Telehealth: Payer: Self-pay

## 2017-07-08 ENCOUNTER — Other Ambulatory Visit: Payer: Self-pay

## 2017-07-08 DIAGNOSIS — R109 Unspecified abdominal pain: Secondary | ICD-10-CM | POA: Diagnosis not present

## 2017-07-08 DIAGNOSIS — Z880 Allergy status to penicillin: Secondary | ICD-10-CM | POA: Diagnosis not present

## 2017-07-08 DIAGNOSIS — O26892 Other specified pregnancy related conditions, second trimester: Secondary | ICD-10-CM | POA: Insufficient documentation

## 2017-07-08 DIAGNOSIS — Z3A22 22 weeks gestation of pregnancy: Secondary | ICD-10-CM | POA: Diagnosis not present

## 2017-07-08 DIAGNOSIS — O26899 Other specified pregnancy related conditions, unspecified trimester: Secondary | ICD-10-CM

## 2017-07-08 LAB — URINALYSIS, ROUTINE W REFLEX MICROSCOPIC
Bilirubin Urine: NEGATIVE
Glucose, UA: NEGATIVE mg/dL
HGB URINE DIPSTICK: NEGATIVE
KETONES UR: 5 mg/dL — AB
Nitrite: NEGATIVE
PH: 6 (ref 5.0–8.0)
Protein, ur: NEGATIVE mg/dL
SPECIFIC GRAVITY, URINE: 1.018 (ref 1.005–1.030)

## 2017-07-08 MED ORDER — ACETAMINOPHEN 500 MG PO TABS
1000.0000 mg | ORAL_TABLET | Freq: Once | ORAL | Status: AC
  Administered 2017-07-08: 1000 mg via ORAL
  Filled 2017-07-08: qty 2

## 2017-07-08 MED ORDER — CYCLOBENZAPRINE HCL 10 MG PO TABS: 10.0000 mg | ORAL_TABLET | Freq: Once | ORAL | Status: DC

## 2017-07-08 NOTE — Discharge Instructions (Signed)
You can soak in a warm tub of water x 20 mins tonight to help relieve abdominal pain. You can repeat Tylenol 1000 mg at 10:30 PM tonight. Purchase a maternity belt that supports above and below the abdomen.   Safe Medications in Pregnancy   Acne: Benzoyl Peroxide Salicylic Acid  Backache/Headache: Tylenol: 2 regular strength every 4 hours OR              2 Extra strength every 6 hours  Colds/Coughs/Allergies: Benadryl (alcohol free) 25 mg every 6 hours as needed Breath right strips Claritin Cepacol throat lozenges Chloraseptic throat spray Cold-Eeze- up to three times per day Cough drops, alcohol free Flonase (by prescription only) Guaifenesin Mucinex Robitussin DM (plain only, alcohol free) Saline nasal spray/drops Sudafed (pseudoephedrine) & Actifed ** use only after [redacted] weeks gestation and if you do not have high blood pressure Tylenol Vicks Vaporub Zinc lozenges Zyrtec   Constipation: Colace Ducolax suppositories Fleet enema Glycerin suppositories Metamucil Milk of magnesia Miralax Senokot Smooth move tea  Diarrhea: Kaopectate Imodium A-D  *NO pepto Bismol  Hemorrhoids: Anusol Anusol HC Preparation H Tucks  Indigestion: Tums Maalox Mylanta Zantac  Pepcid  Insomnia: Benadryl (alcohol free) 25mg  every 6 hours as needed Tylenol PM Unisom, no Gelcaps  Leg Cramps: Tums MagGel  Nausea/Vomiting:  Bonine Dramamine Emetrol Ginger extract Sea bands Meclizine  Nausea medication to take during pregnancy:  Unisom (doxylamine succinate 25 mg tablets) Take one tablet daily at bedtime. If symptoms are not adequately controlled, the dose can be increased to a maximum recommended dose of two tablets daily (1/2 tablet in the morning, 1/2 tablet mid-afternoon and one at bedtime). Vitamin B6 100mg  tablets. Take one tablet twice a day (up to 200 mg per day).  Skin Rashes: Aveeno products Benadryl cream or 25mg  every 6 hours as needed Calamine  Lotion 1% cortisone cream  Yeast infection: Gyne-lotrimin 7 Monistat 7   **If taking multiple medications, please check labels to avoid duplicating the same active ingredients **take medication as directed on the label ** Do not exceed 4000 mg of tylenol in 24 hours **Do not take medications that contain aspirin or ibuprofen

## 2017-07-08 NOTE — Telephone Encounter (Signed)
Patient states that she feels like she is having intense braxton hicks contractions. Patient denies any bleeding or leaking of fluid.  Patient instructed to go to maternity admissions for evaluation.

## 2017-07-08 NOTE — MAU Note (Signed)
About an hour ago was having abd cramps and back pain.  Wasn't really paying attention, has been dealing with this for a while, but today seemed worse, coming every 10-15 min. Now every 20 min

## 2017-07-08 NOTE — MAU Provider Note (Signed)
History     CSN: [id number]  Arrival date and time: 07/08/17 1237   First Provider Initiated Contact with Patient 07/08/17 1513      Chief Complaint  Patient presents with  . Abdominal Pain  . Back Pain   HPI  Ms.  Sheila Kirby is a 22 y.o. year old 242P1001 female at 7181w0d weeks gestation who presents to MAU reporting abdominal pain/cramping and back pain. 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 She chose to ignore it. After dealing with the abdominal pain for a while, she noticed the pain was intermittently every 10-15 mins; now about every 20 mins. She reports good (+) FM.   Past Medical History:  Diagnosis Date  . Medical history non-contributory     Past Surgical History:  Procedure Laterality Date  . NO PAST SURGERIES    . WISDOM TOOTH EXTRACTION      Family History  Problem Relation Age of Onset  . Hypertension Mother   . Diabetes Father   . Hypertension Father   . Cancer Sister        skin cancer  . Cancer Maternal Grandfather        skin  . Cancer Paternal Grandfather        renal  . Spina bifida Paternal Uncle   . Stroke Neg Hx   . Miscarriages / Stillbirths Neg Hx   . Mental retardation Neg Hx     Social History   Tobacco Use  . Smoking status: Never Smoker  . Smokeless tobacco: Never Used  Substance Use Topics  . Alcohol use: No  . Drug use: No    Allergies:  Allergies  Allergen Reactions  . Amoxicillin Other (See Comments)    Stomach pains. Rash Has patient had a PCN reaction causing immediate rash, facial/tongue/throat swelling, SOB or lightheadedness with hypotension: No Has patient had a PCN reaction causing severe rash involving mucus membranes or skin necrosis: No Has patient had a PCN reaction that required hospitalization No Has patient had a PCN reaction occurring within the last 10 years: no If all of the above answers are "NO", then may proceed with Cephalosporin use.     Medications Prior to  Admission  Medication Sig Dispense Refill Last Dose  . acetaminophen (TYLENOL) 325 MG tablet Take 650 mg by mouth every 6 (six) hours as needed.   Taking  . calcium carbonate (TUMS - DOSED IN MG ELEMENTAL CALCIUM) 500 MG chewable tablet Chew 2 tablets by mouth daily as needed for indigestion or heartburn.   Taking  . ondansetron (ZOFRAN) 4 MG tablet Take 1 tablet (4 mg total) by mouth every 8 (eight) hours as needed for nausea or vomiting. (Patient not taking: Reported on 04/29/2017) 20 tablet 5 Not Taking  . prenatal vitamin w/FE, FA (PRENATAL 1 + 1) 27-1 MG TABS tablet Take 1 tablet by mouth daily at 12 noon.   Taking  . ranitidine (ZANTAC) 150 MG tablet Take 150 mg by mouth daily.   Taking    Review of Systems  Constitutional: Negative.   HENT: Negative.   Eyes: Negative.   Respiratory: Negative.   Cardiovascular: Negative.   Gastrointestinal: Negative.   Endocrine: Negative.   Genitourinary: Positive for pelvic pain (mid-pelvis).  Musculoskeletal: Positive for back pain.  Skin: Negative.   Allergic/Immunologic: Negative.   Neurological: Negative.   Hematological: Negative.   Psychiatric/Behavioral: Negative.    Physical Exam   Blood pressure (!) 120/55,  pulse 89, temperature 98.4 F (36.9 C), temperature source Oral, resp. rate 17, weight 200 lb 12 oz (91.1 kg), last menstrual period 02/04/2017, SpO2 100 %.  Physical Exam  Nursing note and vitals reviewed. Constitutional: She is oriented to person, place, and time. She appears well-developed and well-nourished.  HENT:  Head: Normocephalic and atraumatic.  Eyes: Pupils are equal, round, and reactive to light.  Neck: Normal range of motion.  Cardiovascular: Normal rate, regular rhythm and normal heart sounds.  Respiratory: Effort normal and breath sounds normal.  GI: Soft. Bowel sounds are normal.  Genitourinary:  Genitourinary Comments: Dilation: Closed Effacement (%): Thick Cervical Position: Posterior Exam by: Raelyn Mora, CNM   Musculoskeletal: Normal range of motion.  Neurological: She is alert and oriented to person, place, and time.  Skin: Skin is warm and dry.  Psychiatric: She has a normal mood and affect. Her behavior is normal. Judgment and thought content normal.    MAU Course  Procedures  MDM CCUA Continuous Toco -- Occ UI noted Tylenol 1000 mg -- "pain is getting better"  Results for orders placed or performed during the hospital encounter of 07/08/17 (from the past 48 hour(s))  Urinalysis, Routine w reflex microscopic     Status: Abnormal   Collection Time: 07/08/17  1:44 PM  Result Value Ref Range   Color, Urine YELLOW YELLOW   APPearance HAZY (A) CLEAR   Specific Gravity, Urine 1.018 1.005 - 1.030   pH 6.0 5.0 - 8.0   Glucose, UA NEGATIVE NEGATIVE mg/dL   Hgb urine dipstick NEGATIVE NEGATIVE   Bilirubin Urine NEGATIVE NEGATIVE   Ketones, ur 5 (A) NEGATIVE mg/dL   Protein, ur NEGATIVE NEGATIVE mg/dL   Nitrite NEGATIVE NEGATIVE   Leukocytes, UA SMALL (A) NEGATIVE   RBC / HPF 0-5 0 - 5 RBC/hpf   WBC, UA 0-5 0 - 5 WBC/hpf   Bacteria, UA RARE (A) NONE SEEN   Squamous Epithelial / LPF 11-20 0 - 5   Mucus PRESENT     Comment: Performed at [facility], [address], [place], [place] [address]    Assessment and Plan  Abdominal pain affecting pregnancy - Plan: Discharge patient - Advised to take Tylenol 1000 mg every 6 hrs prn pain - Information provided on abdominal pain in pregnancy  - Keep scheduled appt with [facility] - Patient verbalized an understanding of the plan of care and agrees.    Raelyn Mora, MSN, CNM 07/08/2017, 3:13 PM

## 2017-07-09 ENCOUNTER — Encounter (HOSPITAL_COMMUNITY): Payer: Self-pay | Admitting: Obstetrics and Gynecology

## 2017-07-31 ENCOUNTER — Ambulatory Visit (INDEPENDENT_AMBULATORY_CARE_PROVIDER_SITE_OTHER): Payer: 59 | Admitting: Obstetrics & Gynecology

## 2017-07-31 VITALS — BP 111/64 | HR 83 | Wt 203.4 lb

## 2017-07-31 DIAGNOSIS — R8271 Bacteriuria: Secondary | ICD-10-CM

## 2017-07-31 DIAGNOSIS — Z3482 Encounter for supervision of other normal pregnancy, second trimester: Secondary | ICD-10-CM

## 2017-07-31 DIAGNOSIS — Z3480 Encounter for supervision of other normal pregnancy, unspecified trimester: Secondary | ICD-10-CM

## 2017-07-31 DIAGNOSIS — Z8279 Family history of other congenital malformations, deformations and chromosomal abnormalities: Secondary | ICD-10-CM

## 2017-07-31 NOTE — Progress Notes (Signed)
   PRENATAL VISIT NOTE  Subjective:  Sheila Kirby is a 22 y.o. G2P1001 at [redacted]w[redacted]d being seen today for ongoing prenatal care.  She is currently monitored for the following issues for this low-risk pregnancy and has Supervision of other normal pregnancy, antepartum; Family history of spina bifida; Strep throat exposure; and GBS bacteriuria on their problem list.  Patient reports no complaints.  Contractions: Not present. Vag. Bleeding: None.  Movement: Present. Denies leaking of fluid.   The following portions of the patient's history were reviewed and updated as appropriate: allergies, current medications, past family history, past medical history, past social history, past surgical history and problem list. Problem list updated.  Objective:   Vitals:   07/31/17 1559  BP: 111/64  Pulse: 83  Weight: 203 lb 6.4 oz (92.3 kg)    Fetal Status: Fetal Heart Rate (bpm): 133   Movement: Present     General:  Alert, oriented and cooperative. Patient is in no acute distress.  Skin: Skin is warm and dry. No rash noted.   Cardiovascular: Normal heart rate noted  Respiratory: Normal respiratory effort, no problems with respiration noted  Abdomen: Soft, gravid, appropriate for gestational age.  Pain/Pressure: Present     Pelvic: Cervical exam deferred        Extremities: Normal range of motion.  Edema: None  Mental Status: Normal mood and affect. Normal behavior. Normal judgment and thought content.   Assessment and Plan:  Pregnancy: G2P1001 at 532w2d  1. Supervision of other normal pregnancy, antepartum Pt agrees to f/u in 2 weeks for 2 hour GTT. Has a FH of type 2 DM  2. GBS bacteriuria Needs ATBX in labor  3. Family history of spina bifida  4. Abd pain Seen in MAU. Sx improved since that time   Preterm labor symptoms and general obstetric precautions including but not limited to vaginal bleeding, contractions, leaking of fluid and fetal movement were reviewed in detail with the  patient. Please refer to After Visit Summary for other counseling recommendations.  Return in about 1 month (around 08/28/2017).  No future appointments.  Willodean Rosenthalarolyn Harraway-Smith, MD

## 2017-08-13 ENCOUNTER — Telehealth: Payer: Self-pay

## 2017-08-13 NOTE — Telephone Encounter (Signed)
Pt. states that she tripped over her toddler and fell on her hip bone and back yesterday while trying to kill a huge bug that was crawling on her.Pt. Denies any bleeding and baby is moving well. Pt states that she has been taking tylenol with no relief and wants to take pain meds left from dental work. Pt states that a warm heating pad has provided some relief. Pt advised not to take pain meds but to continue taking tylenol and warm heating pad. Pt is scheduled to come to the office on 08/14/17.

## 2017-08-14 ENCOUNTER — Other Ambulatory Visit: Payer: 59

## 2017-08-14 ENCOUNTER — Other Ambulatory Visit: Payer: Self-pay

## 2017-08-14 DIAGNOSIS — Z3480 Encounter for supervision of other normal pregnancy, unspecified trimester: Secondary | ICD-10-CM

## 2017-08-14 NOTE — Progress Notes (Signed)
error 

## 2017-08-15 LAB — CBC
Hematocrit: 35.5 % (ref 34.0–46.6)
Hemoglobin: 11.8 g/dL (ref 11.1–15.9)
MCH: 29.4 pg (ref 26.6–33.0)
MCHC: 33.2 g/dL (ref 31.5–35.7)
MCV: 89 fL (ref 79–97)
PLATELETS: 220 10*3/uL (ref 150–450)
RBC: 4.01 x10E6/uL (ref 3.77–5.28)
RDW: 13.3 % (ref 12.3–15.4)
WBC: 8.2 10*3/uL (ref 3.4–10.8)

## 2017-08-15 LAB — GLUCOSE TOLERANCE, 2 HOURS W/ 1HR
GLUCOSE, FASTING: 71 mg/dL (ref 65–91)
Glucose, 1 hour: 98 mg/dL (ref 65–179)
Glucose, 2 hour: 83 mg/dL (ref 65–152)

## 2017-08-15 LAB — RPR: RPR Ser Ql: NONREACTIVE

## 2017-08-15 LAB — HIV ANTIBODY (ROUTINE TESTING W REFLEX): HIV Screen 4th Generation wRfx: NONREACTIVE

## 2017-08-16 ENCOUNTER — Ambulatory Visit (INDEPENDENT_AMBULATORY_CARE_PROVIDER_SITE_OTHER): Payer: 59 | Admitting: Family Medicine

## 2017-08-16 VITALS — BP 111/67 | HR 88 | Wt 204.0 lb

## 2017-08-16 DIAGNOSIS — O99891 Other specified diseases and conditions complicating pregnancy: Secondary | ICD-10-CM

## 2017-08-16 DIAGNOSIS — O9989 Other specified diseases and conditions complicating pregnancy, childbirth and the puerperium: Secondary | ICD-10-CM

## 2017-08-16 DIAGNOSIS — M549 Dorsalgia, unspecified: Secondary | ICD-10-CM | POA: Diagnosis not present

## 2017-08-16 DIAGNOSIS — M9901 Segmental and somatic dysfunction of cervical region: Secondary | ICD-10-CM

## 2017-08-16 DIAGNOSIS — Z3480 Encounter for supervision of other normal pregnancy, unspecified trimester: Secondary | ICD-10-CM

## 2017-08-16 MED ORDER — CYCLOBENZAPRINE HCL 10 MG PO TABS: 5.0000 mg | ORAL_TABLET | Freq: Three times a day (TID) | ORAL | 2 refills | Status: DC | PRN

## 2017-08-16 MED FILL — CYCLOBENZAPRINE 10 MG TAB: 10 | 10 days supply | Qty: 30 | Fill #0

## 2017-08-16 NOTE — Progress Notes (Signed)
   PRENATAL VISIT NOTE  Subjective:  Sheila Kirby is a 22 y.o. G2P1001 at 7560w4d being seen today for ongoing prenatal care.  She is currently monitored for the following issues for this low-risk pregnancy and has Supervision of other normal pregnancy, antepartum; Family history of spina bifida; Strep throat exposure; and GBS bacteriuria on their problem list.  Patient reports left back pain after hit back. worse with sitting. no radiation..  Contractions: Not present. Vag. Bleeding: None.  Movement: Present. Denies leaking of fluid.   The following portions of the patient's history were reviewed and updated as appropriate: allergies, current medications, past family history, past medical history, past social history, past surgical history and problem list. Problem list updated.  Objective:   Vitals:   08/16/17 1127  BP: 111/67  Pulse: 88  Weight: 204 lb (92.5 kg)    Fetal Status:     Movement: Present     General:  Alert, oriented and cooperative. Patient is in no acute distress.  Skin: Skin is warm and dry. No rash noted.   Cardiovascular: Normal heart rate noted  Respiratory: Normal respiratory effort, no problems with respiration noted  Abdomen: Soft, gravid, appropriate for gestational age. Pain/Pressure: Present     Pelvic:  Cervical exam deferred        MSK: Restriction, tenderness, tissue texture changes, and paraspinal spasm in the left spine  Neuro: Moves all four extremities with no focal neurological deficit  Extremities: Normal range of motion.  Edema: None  Mental Status: Normal mood and affect. Normal behavior. Normal judgment and thought content.   OSE: Head   Cervical   Thoracic   Rib   Lumbar L5 ESRL, L1 FSRR  Sacrum L/L  Pelvis Right ant innom    Assessment and Plan:  Pregnancy: G2P1001 at 3260w4d  1. Supervision of other normal pregnancy, antepartum FHT normal  2. Back pain affecting pregnancy in second trimester 3. Somatic dysfunction of cervical  region OMT done after patient permission. HVLA technique utilized. 3 areas treated with improvement of tissue texture and joint mobility. Patient tolerated procedure well.     Preterm labor symptoms and general obstetric precautions including but not limited to vaginal bleeding, contractions, leaking of fluid and fetal movement were reviewed in detail with the patient. Please refer to After Visit Summary for other counseling recommendations.  No follow-ups on file.  Levie HeritageStinson, Sallie Maker J, DO

## 2017-08-16 NOTE — Progress Notes (Signed)
Patient states she has had back since Monday. Armandina StammerJennifer Rahi Chandonnet RN

## 2017-08-21 ENCOUNTER — Telehealth: Payer: Self-pay

## 2017-08-21 MED ORDER — GENTAMICIN SULFATE 0.3 % OP SOLN: 1.0000 [drp] | OPHTHALMIC | 0 refills | Status: DC

## 2017-08-21 NOTE — Telephone Encounter (Signed)
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  Patient is 28 weeks and now experiencing burning and red eyes. Patient wondering if Dr. Adrian BlackwaterStinson could call in the eye drop to treat pink eye for her.   Will route to provider and let patient know plan of care. Armandina StammerJennifer Howard RN

## 2017-08-21 NOTE — Addendum Note (Signed)
Addended by: Reva BoresPRATT, Hala Narula S on: 08/21/2017 04:00 PM   Modules accepted: Orders

## 2017-08-22 MED FILL — GENTAMICIN 3 MG/ML EYE DRP: 0.3 | 8 days supply | Qty: 5 | Fill #0

## 2017-08-22 NOTE — Telephone Encounter (Signed)
Patient called and made aware of eye drop prescription at her pharmacy. Armandina StammerJennifer Antwion Carpenter RN

## 2017-08-29 ENCOUNTER — Ambulatory Visit (INDEPENDENT_AMBULATORY_CARE_PROVIDER_SITE_OTHER): Payer: Managed Care, Other (non HMO) | Admitting: Obstetrics & Gynecology

## 2017-08-29 VITALS — BP 124/70 | HR 94 | Wt 205.0 lb

## 2017-08-29 DIAGNOSIS — Z3480 Encounter for supervision of other normal pregnancy, unspecified trimester: Secondary | ICD-10-CM

## 2017-08-29 DIAGNOSIS — Z8279 Family history of other congenital malformations, deformations and chromosomal abnormalities: Secondary | ICD-10-CM

## 2017-08-29 DIAGNOSIS — R8271 Bacteriuria: Secondary | ICD-10-CM

## 2017-08-29 NOTE — Progress Notes (Signed)
   PRENATAL VISIT NOTE  Subjective:  Sheila Kirby is a 22 y.o. G2P1001 at 622w3d being seen today for ongoing prenatal care.  She is currently monitored for the following issues for this low-risk pregnancy and has Supervision of other normal pregnancy, antepartum; Family history of spina bifida; Strep throat exposure; and GBS bacteriuria on their problem list.  Patient reports URI sx recently. No fever or chills. .  Contractions: Not present. Vag. Bleeding: None.  Movement: Present. Denies leaking of fluid.   The following portions of the patient's history were reviewed and updated as appropriate: allergies, current medications, past family history, past medical history, past social history, past surgical history and problem list. Problem list updated.  Objective:   Vitals:   08/29/17 0836  BP: 124/70  Pulse: 94  Weight: 205 lb (93 kg)    Fetal Status: Fetal Heart Rate (bpm): 145   Movement: Present     General:  Alert, oriented and cooperative. Patient is in no acute distress.  Skin: Skin is warm and dry. No rash noted.   Cardiovascular: Normal heart rate noted  Respiratory: Normal respiratory effort, no problems with respiration noted  Abdomen: Soft, gravid, appropriate for gestational age.  Pain/Pressure: Present     Pelvic: Cervical exam deferred        Extremities: Normal range of motion.  Edema: None  Mental Status: Normal mood and affect. Normal behavior. Normal judgment and thought content.   Assessment and Plan:  Pregnancy: G2P1001 at 4122w3d  1. Supervision of other normal pregnancy, antepartum  2. GBS bacteriuria Needs atbx in labor  3. Family history of spina bifida   Preterm labor symptoms and general obstetric precautions including but not limited to vaginal bleeding, contractions, leaking of fluid and fetal movement were reviewed in detail with the patient. Please refer to After Visit Summary for other counseling recommendations.  Return in about 2 weeks  (around 09/12/2017).  No future appointments.  Willodean Rosenthalarolyn Harraway-Smith, MD

## 2017-08-29 NOTE — Patient Instructions (Signed)

## 2017-09-19 ENCOUNTER — Ambulatory Visit (INDEPENDENT_AMBULATORY_CARE_PROVIDER_SITE_OTHER): Payer: Managed Care, Other (non HMO) | Admitting: Family Medicine

## 2017-09-19 VITALS — BP 103/58 | HR 92 | Wt 207.1 lb

## 2017-09-19 DIAGNOSIS — Z3480 Encounter for supervision of other normal pregnancy, unspecified trimester: Secondary | ICD-10-CM

## 2017-09-19 DIAGNOSIS — R8271 Bacteriuria: Secondary | ICD-10-CM

## 2017-09-19 NOTE — Progress Notes (Signed)
   PRENATAL VISIT NOTE  Subjective:  Sheila Kirby is a 22 y.o. G2P1001 at 5755w3d being seen today for ongoing prenatal care.  She is currently monitored for the following issues for this low-risk pregnancy and has Supervision of other normal pregnancy, antepartum; Family history of spina bifida; Strep throat exposure; and GBS bacteriuria on their problem list.  Patient reports no complaints.  Contractions: Irregular. Vag. Bleeding: None.  Movement: Present. Denies leaking of fluid.   The following portions of the patient's history were reviewed and updated as appropriate: allergies, current medications, past family history, past medical history, past social history, past surgical history and problem list. Problem list updated.  Objective:   Vitals:   09/19/17 0825  BP: (!) 103/58  Pulse: 92  Weight: 207 lb 1.9 oz (93.9 kg)    Fetal Status: Fetal Heart Rate (bpm): 140 Fundal Height: 33 cm Movement: Present     General:  Alert, oriented and cooperative. Patient is in no acute distress.  Skin: Skin is warm and dry. No rash noted.   Cardiovascular: Normal heart rate noted  Respiratory: Normal respiratory effort, no problems with respiration noted  Abdomen: Soft, gravid, appropriate for gestational age.  Pain/Pressure: Absent     Pelvic: Cervical exam deferred        Extremities: Normal range of motion.  Edema: None  Mental Status: Normal mood and affect. Normal behavior. Normal judgment and thought content.   Assessment and Plan:  Pregnancy: G2P1001 at 5055w3d  1. Supervision of other normal pregnancy, antepartum FHT and FH normal  2. GBS bacteriuria   Preterm labor symptoms and general obstetric precautions including but not limited to vaginal bleeding, contractions, leaking of fluid and fetal movement were reviewed in detail with the patient. Please refer to After Visit Summary for other counseling recommendations.  Return in about 2 weeks (around 10/03/2017).  No future  appointments.  Levie HeritageJacob J Adelaine Roppolo, DO

## 2017-09-27 DIAGNOSIS — Z3482 Encounter for supervision of other normal pregnancy, second trimester: Secondary | ICD-10-CM | POA: Diagnosis not present

## 2017-09-27 DIAGNOSIS — Z3483 Encounter for supervision of other normal pregnancy, third trimester: Secondary | ICD-10-CM | POA: Diagnosis not present

## 2017-10-03 ENCOUNTER — Ambulatory Visit (INDEPENDENT_AMBULATORY_CARE_PROVIDER_SITE_OTHER): Payer: Managed Care, Other (non HMO) | Admitting: Family Medicine

## 2017-10-03 VITALS — BP 116/62 | HR 103 | Wt 206.0 lb

## 2017-10-03 DIAGNOSIS — Z23 Encounter for immunization: Secondary | ICD-10-CM

## 2017-10-03 DIAGNOSIS — Z3480 Encounter for supervision of other normal pregnancy, unspecified trimester: Secondary | ICD-10-CM

## 2017-10-03 DIAGNOSIS — R12 Heartburn: Secondary | ICD-10-CM

## 2017-10-03 DIAGNOSIS — Z3483 Encounter for supervision of other normal pregnancy, third trimester: Secondary | ICD-10-CM

## 2017-10-03 DIAGNOSIS — R8271 Bacteriuria: Secondary | ICD-10-CM

## 2017-10-03 DIAGNOSIS — O26893 Other specified pregnancy related conditions, third trimester: Secondary | ICD-10-CM

## 2017-10-03 MED ORDER — PANTOPRAZOLE SODIUM 40 MG PO TBEC: 40.0000 mg | DELAYED_RELEASE_TABLET | Freq: Every day | ORAL | 2 refills | Status: DC

## 2017-10-03 MED FILL — PANTOPRAZOLE SOD DR 40 MG T: 40 | 90 days supply | Qty: 90 | Fill #0

## 2017-10-03 NOTE — Progress Notes (Signed)
Patient complaining of worsening heartburn. Patient has been using zantac but it is not helping. [name] RN

## 2017-10-03 NOTE — Progress Notes (Signed)
   PRENATAL VISIT NOTE  Subjective:  Sheila Kirby is a 22 y.o. G2P1001 at [redacted]w[redacted]d being seen today for ongoing prenatal care.  She is currently monitored for the following issues for this low-risk pregnancy and has Supervision of other normal pregnancy, antepartum; Family history of spina bifida; Strep throat exposure; and GBS bacteriuria on their problem list.  Patient reports occasional contractions.  Contractions: Irregular. Vag. Bleeding: None.  Movement: Present. Denies leaking of fluid.   The following portions of the patient's history were reviewed and updated as appropriate: allergies, current medications, past family history, past medical history, past social history, past surgical history and problem list. Problem list updated.  Objective:   Vitals:   10/03/17 0821  BP: 116/62  Pulse: (!) 103  Weight: 206 lb (93.4 kg)    Fetal Status: Fetal Heart Rate (bpm): 150 Fundal Height: 35 cm Movement: Present     General:  Alert, oriented and cooperative. Patient is in no acute distress.  Skin: Skin is warm and dry. No rash noted.   Cardiovascular: Normal heart rate noted  Respiratory: Normal respiratory effort, no problems with respiration noted  Abdomen: Soft, gravid, appropriate for gestational age.  Pain/Pressure: Present     Pelvic: Cervical exam deferred        Extremities: Normal range of motion.  Edema: None  Mental Status: Normal mood and affect. Normal behavior. Normal judgment and thought content.   Assessment and Plan:  Pregnancy: G2P1001 at [redacted]w[redacted]d  1. Supervision of other normal pregnancy, antepartum FHT and FH normal  2. GBS bacteriuria Intrapartum prophylaxis  3. Heartburn in pregnancy in third trimester Add protonix PRn  Preterm labor symptoms and general obstetric precautions including but not limited to vaginal bleeding, contractions, leaking of fluid and fetal movement were reviewed in detail with the patient. Please refer to After Visit Summary for other  counseling recommendations.  No follow-ups on file.  Future Appointments  Date Time Provider Department Center  10/25/2017  8:30 AM [name], DO [facility] None    [name], DO

## 2017-10-10 ENCOUNTER — Telehealth: Payer: Self-pay

## 2017-10-10 ENCOUNTER — Ambulatory Visit (INDEPENDENT_AMBULATORY_CARE_PROVIDER_SITE_OTHER): Payer: Managed Care, Other (non HMO) | Admitting: Family Medicine

## 2017-10-10 VITALS — BP 124/71 | HR 95 | Wt 206.0 lb

## 2017-10-10 DIAGNOSIS — R8271 Bacteriuria: Secondary | ICD-10-CM

## 2017-10-10 DIAGNOSIS — Z3480 Encounter for supervision of other normal pregnancy, unspecified trimester: Secondary | ICD-10-CM

## 2017-10-10 DIAGNOSIS — Z3483 Encounter for supervision of other normal pregnancy, third trimester: Secondary | ICD-10-CM

## 2017-10-10 DIAGNOSIS — O4693 Antepartum hemorrhage, unspecified, third trimester: Secondary | ICD-10-CM

## 2017-10-10 NOTE — Progress Notes (Signed)
Patient had one episode of bleeding in the toilet. [name] RN

## 2017-10-10 NOTE — Telephone Encounter (Signed)
Patient called stating that she has had some braxton hicks contractions throughout the evening. Patient states she had one episode of a small amount of blood in the toilet. Patient states baby is moving. Patient instructed to come today for evaluation. [name] RN

## 2017-10-10 NOTE — Progress Notes (Signed)
   PRENATAL VISIT NOTE  Subjective:  Sheila Kirby is a 22 y.o. G2P1001 at [redacted]w[redacted]d being seen today for ongoing prenatal care.  She is currently monitored for the following issues for this low-risk pregnancy and has Supervision of other normal pregnancy, antepartum; Family history of spina bifida; Strep throat exposure; and GBS bacteriuria on their problem list.  Patient reports vaginal bleeding last night. Had some contractions throughout the night, went to the bathroom and noticed the blood in the toilet this morning when she woke up. Good fetal activity, no further contractions. No further bleeding or discharge. No recent sex.  Contractions: Irritability. Vag. Bleeding: Scant.  Movement: Present. Denies leaking of fluid.   The following portions of the patient's history were reviewed and updated as appropriate: allergies, current medications, past family history, past medical history, past social history, past surgical history and problem list. Problem list updated.  Objective:   Vitals:   10/10/17 1343  BP: 124/71  Pulse: 95  Weight: 206 lb (93.4 kg)    Fetal Status: Fetal Heart Rate (bpm): 155 Fundal Height: 36 cm Movement: Present  Presentation: Vertex  General:  Alert, oriented and cooperative. Patient is in no acute distress.  Skin: Skin is warm and dry. No rash noted.   Cardiovascular: Normal heart rate noted  Respiratory: Normal respiratory effort, no problems with respiration noted  Abdomen: Soft, gravid, appropriate for gestational age.  Pain/Pressure: Present     Pelvic: Cervical exam performed Dilation: 1.5 Effacement (%): 50 Station: -3. No blood on exam  Extremities: Normal range of motion.  Edema: None  Mental Status: Normal mood and affect. Normal behavior. Normal judgment and thought content.   Assessment and Plan:  Pregnancy: G2P1001 at [redacted]w[redacted]d  1. Supervision of other normal pregnancy, antepartum FHT and FH normal NST done: reactive  2. GBS  bacteriuria Intrapartum prophylaxis.  3. Vaginal bleeding in pregnancy, third trimester No current bleeding or evidence of old blood. ? From cervical dilation with contractions. NST reactive. Precautions given.  Preterm labor symptoms and general obstetric precautions including but not limited to vaginal bleeding, contractions, leaking of fluid and fetal movement were reviewed in detail with the patient. Please refer to After Visit Summary for other counseling recommendations.  Return in about 1 week (around 10/17/2017) for OB f/u.  Future Appointments  Date Time Provider Department Center  10/18/2017  9:15 AM Anyanwu, Jethro Bastos, MD [facility] None  10/25/2017  8:30 AM [name], DO [facility] None  11/01/2017  9:15 AM [name], DO [facility] None    [name], DO

## 2017-10-18 ENCOUNTER — Ambulatory Visit (INDEPENDENT_AMBULATORY_CARE_PROVIDER_SITE_OTHER): Payer: Managed Care, Other (non HMO) | Admitting: Obstetrics & Gynecology

## 2017-10-18 VITALS — BP 104/58 | HR 90 | Wt 208.4 lb

## 2017-10-18 DIAGNOSIS — R8271 Bacteriuria: Secondary | ICD-10-CM

## 2017-10-18 DIAGNOSIS — Z113 Encounter for screening for infections with a predominantly sexual mode of transmission: Secondary | ICD-10-CM

## 2017-10-18 DIAGNOSIS — Z3480 Encounter for supervision of other normal pregnancy, unspecified trimester: Secondary | ICD-10-CM

## 2017-10-18 LAB — OB RESULTS CONSOLE GC/CHLAMYDIA: Gonorrhea: NEGATIVE

## 2017-10-18 NOTE — Patient Instructions (Signed)
Return to clinic for any scheduled appointments or obstetric concerns, or go to [facility] for evaluation  

## 2017-10-18 NOTE — Progress Notes (Signed)
   PRENATAL VISIT NOTE  Subjective:  Sheila Kirby is a 22 y.o. G2P1001 at [redacted]w[redacted]d being seen today for ongoing prenatal care.  She is currently monitored for the following issues for this low-risk pregnancy and has Supervision of other normal pregnancy, antepartum; Family history of spina bifida; Strep throat exposure; and GBS bacteriuria on their problem list.  Patient reports occasional contractions.  Contractions: Irregular. Vag. Bleeding: None.  Movement: Present. Denies leaking of fluid.   The following portions of the patient's history were reviewed and updated as appropriate: allergies, current medications, past family history, past medical history, past social history, past surgical history and problem list. Problem list updated.  Objective:   Vitals:   10/18/17 0911  BP: (!) 104/58  Pulse: 90  Weight: 208 lb 6.4 oz (94.5 kg)    Fetal Status: Fetal Heart Rate (bpm): 152 Fundal Height: 38 cm Movement: Present  Presentation: Vertex  General:  Alert, oriented and cooperative. Patient is in no acute distress.  Skin: Skin is warm and dry. No rash noted.   Cardiovascular: Normal heart rate noted  Respiratory: Normal respiratory effort, no problems with respiration noted  Abdomen: Soft, gravid, appropriate for gestational age.  Pain/Pressure: Present     Pelvic: Cervical exam performed Dilation: 3 Effacement (%): 70 Station: -2  Extremities: Normal range of motion.  Edema: Trace  Mental Status: Normal mood and affect. Normal behavior. Normal judgment and thought content.   Assessment and Plan:  Pregnancy: G2P1001 at [redacted]w[redacted]d  1. GBS bacteriuria Will get intrapartum prophylaxis.  2. Supervision of other normal pregnancy, antepartum Pelvic cultures done.  - GC/Chlamydia probe amp ([facility])not at [facility] Labor symptoms and general obstetric precautions including but not limited to vaginal bleeding, contractions, leaking of fluid and fetal movement were reviewed in detail with the  patient. Please refer to After Visit Summary for other counseling recommendations.  Return in about 1 week (around 10/25/2017) for OB Visit.  Future Appointments  Date Time Provider Department Center  10/25/2017  8:30 AM [name], DO [facility] None  11/01/2017  9:15 AM Adrian Blackwater Rhona Raider, DO [facility] None    Jaynie Collins, MD

## 2017-10-19 ENCOUNTER — Encounter (HOSPITAL_COMMUNITY): Payer: Self-pay

## 2017-10-19 ENCOUNTER — Inpatient Hospital Stay (HOSPITAL_COMMUNITY)
Admission: AD | Admit: 2017-10-19 | Discharge: 2017-10-19 | Disposition: A | Discharge status: Home / Self Care | Payer: 59 | Source: Ambulatory Visit | Attending: Obstetrics and Gynecology | Admitting: Obstetrics and Gynecology

## 2017-10-19 ENCOUNTER — Other Ambulatory Visit: Payer: Self-pay

## 2017-10-19 DIAGNOSIS — Z3A37 37 weeks gestation of pregnancy: Secondary | ICD-10-CM | POA: Insufficient documentation

## 2017-10-19 DIAGNOSIS — O471 False labor at or after 37 completed weeks of gestation: Secondary | ICD-10-CM | POA: Diagnosis not present

## 2017-10-19 DIAGNOSIS — O479 False labor, unspecified: Secondary | ICD-10-CM

## 2017-10-19 NOTE — MAU Note (Signed)
Sheila Kirby is a 22 y.o. at [redacted]w[redacted]d here in MAU reporting: +contractions. 5-6 minutes apart. States had not had much go on for the past 20 minutes minutes. Still lots of vaginal pressure. Onset of complaint: 1230 this afternoon Pain score: denies at this time. 3cm/70% at last office visit yesterday. Vitals:   10/19/17 1726  BP: 129/73  Pulse: (!) 111  Resp: 18  Temp: (!) 97.5 F (36.4 C)  SpO2: 99%     Lab orders placed from triage: none

## 2017-10-21 LAB — GC/CHLAMYDIA PROBE AMP (~~LOC~~) NOT AT ARMC
Chlamydia: NEGATIVE
Neisseria Gonorrhea: NEGATIVE

## 2017-10-22 ENCOUNTER — Telehealth (HOSPITAL_COMMUNITY): Payer: Self-pay | Admitting: *Deleted

## 2017-10-22 NOTE — Telephone Encounter (Signed)
Preadmission screen  

## 2017-10-25 ENCOUNTER — Ambulatory Visit (INDEPENDENT_AMBULATORY_CARE_PROVIDER_SITE_OTHER): Payer: Managed Care, Other (non HMO) | Admitting: Family Medicine

## 2017-10-25 VITALS — BP 125/74 | HR 101 | Wt 207.0 lb

## 2017-10-25 DIAGNOSIS — Z3483 Encounter for supervision of other normal pregnancy, third trimester: Secondary | ICD-10-CM

## 2017-10-25 DIAGNOSIS — R8271 Bacteriuria: Secondary | ICD-10-CM

## 2017-10-25 DIAGNOSIS — Z23 Encounter for immunization: Secondary | ICD-10-CM

## 2017-10-25 DIAGNOSIS — Z3480 Encounter for supervision of other normal pregnancy, unspecified trimester: Secondary | ICD-10-CM

## 2017-10-25 NOTE — Progress Notes (Signed)
   PRENATAL VISIT NOTE  Subjective:  Sheila Kirby is a 22 y.o. G2P1001 at [redacted]w[redacted]d being seen today for ongoing prenatal care.  She is currently monitored for the following issues for this low-risk pregnancy and has Supervision of other normal pregnancy, antepartum; Family history of spina bifida; Strep throat exposure; and GBS bacteriuria on their problem list.  Patient reports no complaints.  Contractions: Irregular. Vag. Bleeding: None.  Movement: Present. Denies leaking of fluid.   The following portions of the patient's history were reviewed and updated as appropriate: allergies, current medications, past family history, past medical history, past social history, past surgical history and problem list. Problem list updated.  Objective:   Vitals:   10/25/17 0835  BP: 125/74  Pulse: (!) 101  Weight: 207 lb (93.9 kg)    Fetal Status: Fetal Heart Rate (bpm): 146 Fundal Height: 39 cm Movement: Present  Presentation: Vertex  General:  Alert, oriented and cooperative. Patient is in no acute distress.  Skin: Skin is warm and dry. No rash noted.   Cardiovascular: Normal heart rate noted  Respiratory: Normal respiratory effort, no problems with respiration noted  Abdomen: Soft, gravid, appropriate for gestational age.  Pain/Pressure: Present     Pelvic: Cervical exam performed Dilation: 3 Effacement (%): 70 Station: -2  Extremities: Normal range of motion.  Edema: Trace  Mental Status: Normal mood and affect. Normal behavior. Normal judgment and thought content.   Assessment and Plan:  Pregnancy: G2P1001 at [redacted]w[redacted]d  1. Supervision of other normal pregnancy, antepartum FHT and FH normal  2. GBS bacteriuria Intrapartum PPx  3. Needs flu shot  - Flu Vaccine QUAD 36+ mos IM  Term labor symptoms and general obstetric precautions including but not limited to vaginal bleeding, contractions, leaking of fluid and fetal movement were reviewed in detail with the patient. Please refer to After  Visit Summary for other counseling recommendations.  No follow-ups on file.  Future Appointments  Date Time Provider Department Center  11/01/2017  9:15 AM [name], DO [facility] None  11/05/2017  7:00 AM [facility] None    [name], DO

## 2017-10-30 ENCOUNTER — Other Ambulatory Visit: Payer: Self-pay | Admitting: Advanced Practice Midwife

## 2017-11-01 ENCOUNTER — Ambulatory Visit (INDEPENDENT_AMBULATORY_CARE_PROVIDER_SITE_OTHER): Payer: Managed Care, Other (non HMO) | Admitting: Family Medicine

## 2017-11-01 VITALS — BP 125/84 | HR 96 | Wt 210.0 lb

## 2017-11-01 DIAGNOSIS — M549 Dorsalgia, unspecified: Secondary | ICD-10-CM

## 2017-11-01 DIAGNOSIS — O9989 Other specified diseases and conditions complicating pregnancy, childbirth and the puerperium: Secondary | ICD-10-CM

## 2017-11-01 DIAGNOSIS — Z3480 Encounter for supervision of other normal pregnancy, unspecified trimester: Secondary | ICD-10-CM

## 2017-11-01 DIAGNOSIS — R8271 Bacteriuria: Secondary | ICD-10-CM

## 2017-11-01 DIAGNOSIS — M9903 Segmental and somatic dysfunction of lumbar region: Secondary | ICD-10-CM

## 2017-11-01 NOTE — Progress Notes (Signed)
   PRENATAL VISIT NOTE  Subjective:  Sheila Kirby is a 22 y.o. G2P1001 at [redacted]w[redacted]d being seen today for ongoing prenatal care.  She is currently monitored for the following issues for this low-risk pregnancy and has Supervision of other normal pregnancy, antepartum; Family history of spina bifida; Strep throat exposure; and GBS bacteriuria on their problem list.  Patient reports backache.  Contractions: Irregular. Vag. Bleeding: None.  Movement: Present. Denies leaking of fluid.   The following portions of the patient's history were reviewed and updated as appropriate: allergies, current medications, past family history, past medical history, past social history, past surgical history and problem list. Problem list updated.  Objective:   Vitals:   11/01/17 0900  BP: 125/84  Pulse: 96  Weight: 210 lb (95.3 kg)    Fetal Status:     Movement: Present     General:  Alert, oriented and cooperative. Patient is in no acute distress.  Skin: Skin is warm and dry. No rash noted.   Cardiovascular: Normal heart rate noted  Respiratory: Normal respiratory effort, no problems with respiration noted  Abdomen: Soft, gravid, appropriate for gestational age. Pain/Pressure: Present     Pelvic:  Cervical exam deferred        MSK: Restriction, tenderness, tissue texture changes, and paraspinal spasm in the lumbar and thoracic spine  Neuro: Moves all four extremities with no focal neurological deficit  Extremities: Normal range of motion.  Edema: None  Mental Status: Normal mood and affect. Normal behavior. Normal judgment and thought content.   OSE: Head   Cervical   Thoracic T3 FSRR, T4-5 FSRL, T8 FSRL  Rib Left ribs 4-5 inhaled  Lumbar L5 ESRL  Sacrum L/L  Pelvis Right ant innom    Assessment and Plan:  Pregnancy: G2P1001 at [redacted]w[redacted]d  1. Supervision of other normal pregnancy, antepartum Fht and FH normal  2. Back pain affecting pregnancy in second trimester 3. Somatic dysfunction of lumbar  region OMT done after patient permission. HVLA technique utilized. 5 areas treated with improvement of tissue texture and joint mobility. Patient tolerated procedure well.   4. GBS bacteriuria Intrapartum prophylaxis  Preterm labor symptoms and general obstetric precautions including but not limited to vaginal bleeding, contractions, leaking of fluid and fetal movement were reviewed in detail with the patient. Please refer to After Visit Summary for other counseling recommendations.  No follow-ups on file.  [name], DO

## 2017-11-05 ENCOUNTER — Inpatient Hospital Stay (HOSPITAL_COMMUNITY): Payer: Managed Care, Other (non HMO) | Admitting: Anesthesiology

## 2017-11-05 ENCOUNTER — Encounter (HOSPITAL_COMMUNITY): Payer: Self-pay | Admitting: General Practice

## 2017-11-05 ENCOUNTER — Inpatient Hospital Stay (HOSPITAL_COMMUNITY): Admission: RE | Admit: 2017-11-05 | Payer: Managed Care, Other (non HMO) | Source: Ambulatory Visit

## 2017-11-05 ENCOUNTER — Inpatient Hospital Stay (HOSPITAL_COMMUNITY)
Admission: AD | Admit: 2017-11-05 | Discharge: 2017-11-07 | DRG: 807 | Disposition: A | Discharge status: Home / Self Care | Payer: Managed Care, Other (non HMO) | Attending: Family Medicine | Admitting: Family Medicine

## 2017-11-05 ENCOUNTER — Other Ambulatory Visit: Payer: Self-pay

## 2017-11-05 DIAGNOSIS — Z3483 Encounter for supervision of other normal pregnancy, third trimester: Secondary | ICD-10-CM | POA: Diagnosis present

## 2017-11-05 DIAGNOSIS — Z3490 Encounter for supervision of normal pregnancy, unspecified, unspecified trimester: Secondary | ICD-10-CM | POA: Diagnosis present

## 2017-11-05 DIAGNOSIS — Z3A38 38 weeks gestation of pregnancy: Secondary | ICD-10-CM | POA: Diagnosis not present

## 2017-11-05 DIAGNOSIS — M5489 Other dorsalgia: Secondary | ICD-10-CM | POA: Diagnosis not present

## 2017-11-05 DIAGNOSIS — Z3A39 39 weeks gestation of pregnancy: Secondary | ICD-10-CM | POA: Diagnosis not present

## 2017-11-05 DIAGNOSIS — O99824 Streptococcus B carrier state complicating childbirth: Principal | ICD-10-CM | POA: Diagnosis present

## 2017-11-05 LAB — CBC
HEMATOCRIT: 35.6 % — AB (ref 36.0–46.0)
HEMOGLOBIN: 11.8 g/dL — AB (ref 12.0–15.0)
MCH: 28.6 pg (ref 26.0–34.0)
MCHC: 33.1 g/dL (ref 30.0–36.0)
MCV: 86.2 fL (ref 80.0–100.0)
NRBC: 0 % (ref 0.0–0.2)
PLATELETS: 184 10*3/uL (ref 150–400)
RBC: 4.13 MIL/uL (ref 3.87–5.11)
RDW: 12.2 % (ref 11.5–15.5)
WBC: 10.2 10*3/uL (ref 4.0–10.5)

## 2017-11-05 LAB — TYPE AND SCREEN
ABO/RH(D): O POS
Antibody Screen: NEGATIVE

## 2017-11-05 MED ORDER — TERBUTALINE SULFATE 1 MG/ML IJ SOLN: 0.2500 mg | Freq: Once | INTRAMUSCULAR | Status: DC | PRN

## 2017-11-05 MED ORDER — LIDOCAINE HCL (PF) 1 % IJ SOLN
30.0000 mL | INTRAMUSCULAR | Status: DC | PRN
  Administered 2017-11-06: 30 mL via SUBCUTANEOUS
  Filled 2017-11-05: qty 30

## 2017-11-05 MED ORDER — OXYTOCIN 40 UNITS IN LACTATED RINGERS INFUSION - SIMPLE MED
1.0000 m[IU]/min | INTRAVENOUS | Status: DC
  Administered 2017-11-05: 2 m[IU]/min via INTRAVENOUS

## 2017-11-05 MED ORDER — SOD CITRATE-CITRIC ACID 500-334 MG/5ML PO SOLN: 30.0000 mL | ORAL | Status: DC | PRN

## 2017-11-05 MED ORDER — DIPHENHYDRAMINE HCL 50 MG/ML IJ SOLN: 12.5000 mg | INTRAMUSCULAR | Status: DC | PRN

## 2017-11-05 MED ORDER — PHENYLEPHRINE 40 MCG/ML (10ML) SYRINGE FOR IV PUSH (FOR BLOOD PRESSURE SUPPORT): 80.0000 ug | PREFILLED_SYRINGE | INTRAVENOUS | Status: DC | PRN

## 2017-11-05 MED ORDER — LIDOCAINE HCL (PF) 1 % IJ SOLN
INTRAMUSCULAR | Status: DC | PRN
  Administered 2017-11-05: 10 mL via EPIDURAL

## 2017-11-05 MED ORDER — EPHEDRINE 5 MG/ML INJ: 10.0000 mg | INTRAVENOUS | Status: DC | PRN

## 2017-11-05 MED ORDER — LACTATED RINGERS IV SOLN: 500.0000 mL | INTRAVENOUS | Status: DC | PRN

## 2017-11-05 MED ORDER — CEFAZOLIN SODIUM-DEXTROSE 2-4 GM/100ML-% IV SOLN: 2.0000 g | Freq: Once | INTRAVENOUS | Status: DC

## 2017-11-05 MED ORDER — OXYTOCIN 40 UNITS IN LACTATED RINGERS INFUSION - SIMPLE MED: 2.5000 [IU]/h | INTRAVENOUS | Status: DC

## 2017-11-05 MED ORDER — OXYCODONE-ACETAMINOPHEN 5-325 MG PO TABS: 2.0000 | ORAL_TABLET | ORAL | Status: DC | PRN

## 2017-11-05 MED ORDER — EPHEDRINE 5 MG/ML INJ
10.0000 mg | INTRAVENOUS | Status: DC | PRN
  Filled 2017-11-05: qty 4

## 2017-11-05 MED ORDER — OXYTOCIN 40 UNITS IN LACTATED RINGERS INFUSION - SIMPLE MED
1.0000 m[IU]/min | INTRAVENOUS | Status: DC
  Filled 2017-11-05: qty 1000

## 2017-11-05 MED ORDER — LACTATED RINGERS IV SOLN: 500.0000 mL | Freq: Once | INTRAVENOUS | Status: DC

## 2017-11-05 MED ORDER — OXYTOCIN BOLUS FROM INFUSION
500.0000 mL | Freq: Once | INTRAVENOUS | Status: AC
  Administered 2017-11-06: 500 mL via INTRAVENOUS

## 2017-11-05 MED ORDER — MISOPROSTOL 25 MCG QUARTER TABLET
25.0000 ug | ORAL_TABLET | ORAL | Status: DC
  Administered 2017-11-05: 25 ug via VAGINAL
  Filled 2017-11-05: qty 1

## 2017-11-05 MED ORDER — CEFAZOLIN SODIUM-DEXTROSE 1-4 GM/50ML-% IV SOLN
1.0000 g | Freq: Three times a day (TID) | INTRAVENOUS | Status: DC
  Filled 2017-11-05 (×2): qty 50

## 2017-11-05 MED ORDER — OXYCODONE-ACETAMINOPHEN 5-325 MG PO TABS: 1.0000 | ORAL_TABLET | ORAL | Status: DC | PRN

## 2017-11-05 MED ORDER — PENICILLIN G 3 MILLION UNITS IVPB - SIMPLE MED
3.0000 10*6.[IU] | INTRAVENOUS | Status: DC
  Administered 2017-11-05: 3 10*6.[IU] via INTRAVENOUS
  Filled 2017-11-05 (×2): qty 100

## 2017-11-05 MED ORDER — FENTANYL 2.5 MCG/ML BUPIVACAINE 1/10 % EPIDURAL INFUSION (WH - ANES)
14.0000 mL/h | INTRAMUSCULAR | Status: DC | PRN
  Administered 2017-11-05: 14 mL/h via EPIDURAL
  Filled 2017-11-05: qty 100

## 2017-11-05 MED ORDER — PHENYLEPHRINE 40 MCG/ML (10ML) SYRINGE FOR IV PUSH (FOR BLOOD PRESSURE SUPPORT)
80.0000 ug | PREFILLED_SYRINGE | INTRAVENOUS | Status: DC | PRN
  Filled 2017-11-05: qty 10

## 2017-11-05 MED ORDER — LACTATED RINGERS IV SOLN
INTRAVENOUS | Status: DC
  Administered 2017-11-05: 16:00:00 via INTRAVENOUS

## 2017-11-05 MED ORDER — ACETAMINOPHEN 325 MG PO TABS: 650.0000 mg | ORAL_TABLET | ORAL | Status: DC | PRN

## 2017-11-05 MED ORDER — ONDANSETRON HCL 4 MG/2ML IJ SOLN: 4.0000 mg | Freq: Four times a day (QID) | INTRAMUSCULAR | Status: DC | PRN

## 2017-11-05 MED ORDER — FENTANYL CITRATE (PF) 100 MCG/2ML IJ SOLN: 100.0000 ug | INTRAMUSCULAR | Status: DC | PRN

## 2017-11-05 MED ORDER — SODIUM CHLORIDE 0.9 % IV SOLN
5.0000 10*6.[IU] | Freq: Once | INTRAVENOUS | Status: AC
  Administered 2017-11-05: 5 10*6.[IU] via INTRAVENOUS
  Filled 2017-11-05: qty 5

## 2017-11-05 NOTE — Anesthesia Preprocedure Evaluation (Signed)
Anesthesia Evaluation  Patient identified by MRN, date of birth, ID band Patient awake    Reviewed: Allergy & Precautions, H&P , NPO status , Patient's Chart, lab work & pertinent test results  History of Anesthesia Complications Negative for: history of anesthetic complications  Airway Mallampati: II  TM Distance: >3 FB Neck ROM: full    Dental no notable dental hx.    Pulmonary neg pulmonary ROS,    Pulmonary exam normal        Cardiovascular negative cardio ROS Normal cardiovascular exam Rhythm:regular Rate:Normal     Neuro/Psych negative neurological ROS  negative psych ROS   GI/Hepatic Neg liver ROS, GERD  ,  Endo/Other  negative endocrine ROS  Renal/GU negative Renal ROS  negative genitourinary   Musculoskeletal   Abdominal   Peds  Hematology negative hematology ROS (+)   Anesthesia Other Findings   Reproductive/Obstetrics (+) Pregnancy                             Anesthesia Physical Anesthesia Plan  ASA: II  Anesthesia Plan: Epidural   Post-op Pain Management:    Induction:   PONV Risk Score and Plan:   Airway Management Planned:   Additional Equipment:   Intra-op Plan:   Post-operative Plan:   Informed Consent: I have reviewed the patients History and Physical, chart, labs and discussed the procedure including the risks, benefits and alternatives for the proposed anesthesia with the patient or authorized representative who has indicated his/her understanding and acceptance.       Plan Discussed with:   Anesthesia Plan Comments:         Anesthesia Quick Evaluation  

## 2017-11-05 NOTE — H&P (Addendum)
Sheila Kirby is a 22 y.o. female presenting for elective IOL with favorable cervix.   OB History    Gravida  2   Para  1   Term  1   Preterm      AB      Living  1     SAB      TAB      Ectopic      Multiple  0   Live Births  1          Past Medical History:  Diagnosis Date  . Medical history non-contributory    Past Surgical History:  Procedure Laterality Date  . NO PAST SURGERIES    . WISDOM TOOTH EXTRACTION     Family History: family history includes Cancer in her maternal grandfather, paternal grandfather, and sister; Diabetes in her father; Hypertension in her father and mother; Spina bifida in her paternal uncle. Social History:  reports that she has never smoked. She has never used smokeless tobacco. She reports that she does not drink alcohol or use drugs.     Maternal Diabetes: No Genetic Screening: Normal Maternal Ultrasounds/Referrals: Normal Fetal Ultrasounds or other Referrals:  None Maternal Substance Abuse:  No Significant Maternal Medications:  None Significant Maternal Lab Results:  Lab values include: Group B Strep positive Other Comments:  None  ROS History Dilation: 3 Effacement (%): 50 Station: -2 Exam by:: Dr. Adrian Blackwater Blood pressure 129/74, pulse (!) 106, temperature (!) 97.5 F (36.4 C), temperature source Oral, resp. rate 18, height 5\' 9"  (1.753 m), weight 94.8 kg, last menstrual period 02/04/2017. Maternal Exam:  Uterine Assessment: Contraction strength is mild.  Contraction duration is 60 seconds. Contraction frequency is irregular.   Abdomen: Fundal height is term.       Physical Exam  Constitutional: She is oriented to person, place, and time. She appears well-developed and well-nourished.  Cardiovascular: Normal rate, regular rhythm and normal heart sounds.  Respiratory: Effort normal and breath sounds normal.  Musculoskeletal: Normal range of motion.  Neurological: She is alert and oriented to person, place, and  time.  Psychiatric: She has a normal mood and affect. Her behavior is normal. Judgment and thought content normal.    Dilation: 3 Effacement (%): 50 Station: -2 Presentation: Vertex Exam by:: Dr. Adrian Blackwater   Prenatal labs: ABO, Rh: O/Positive/-- (03/25 1122) Antibody: Negative (03/25 1122) Rubella: 1.88 (03/25 1122) RPR: Non Reactive (08/14 0838)  HBsAg: Negative (03/25 1122)  HIV: Non Reactive (08/14 0838)  GBS:  positive  Assessment/Plan: IOL  Breastfeed IUD for birth control   [name] 11/05/2017, 4:22 PM

## 2017-11-05 NOTE — Anesthesia Pain Management Evaluation Note (Signed)
  CRNA Pain Management Visit Note  Patient: Sheila Kirby, 22 y.o., female  "Hello I am a member of the anesthesia team at [facility]. We have an anesthesia team available at all times to provide care throughout the hospital, including epidural management and anesthesia for C-section. I don't know your plan for the delivery whether it a natural birth, water birth, IV sedation, nitrous supplementation, doula or epidural, but we want to meet your pain goals."   1.Was your pain managed to your expectations on prior hospitalizations?   Yes   2.What is your expectation for pain management during this hospitalization?     Epidural  3.How can we help you reach that goal? *support**  Record the patient's initial score and the patient's pain goal.   Pain: 2  Pain Goal: 7 [facility] wants you to be able to say your pain was always managed very well.  Trellis Paganini 11/05/2017

## 2017-11-05 NOTE — Progress Notes (Signed)
Patient ID: Sheila Kirby, female   DOB: 1995/05/21, 22 y.o.   MRN: [id number]  Category 1 tracing Comfortable with epidural  Dilation: 5.5 Effacement (%): 80 Station: -1 Presentation: Vertex Exam by:: Dr. Adrian Blackwater  Start pit as ctxs starting to space out.  [name], DO 11/05/2017 10:28 PM

## 2017-11-05 NOTE — Anesthesia Procedure Notes (Signed)
Epidural Patient location during procedure: OB Start time: 11/05/2017 9:23 PM End time: 11/05/2017 9:43 PM  Staffing Anesthesiologist: Lucretia Kern, MD Performed: anesthesiologist   Preanesthetic Checklist Completed: patient identified, pre-op evaluation, timeout performed, IV checked, risks and benefits discussed and monitors and equipment checked  Epidural Patient position: sitting Prep: DuraPrep Patient monitoring: heart rate, continuous pulse ox and blood pressure Approach: midline Location: L3-L4 Injection technique: LOR saline  Needle:  Needle type: Tuohy  Needle gauge: 17 G Needle length: 9 cm Needle insertion depth: 7 cm Catheter type: closed end flexible Catheter size: 19 Gauge Catheter at skin depth: 12 cm  Assessment Events: blood not aspirated, injection not painful, no injection resistance, negative IV test and no paresthesia  Additional Notes Reason for block:procedure for pain

## 2017-11-05 NOTE — Progress Notes (Signed)
Patient ID: Sheila Kirby, female   DOB: 1995-03-12, 22 y.o.   MRN: [id number]   FHT cat 1. Moderate contractions  Dilation: 4 Effacement (%): 80 Station: -1 Presentation: Vertex Exam by:: Williom Cedar, DO  AROM clear fluid.  Continue PCN.  11/05/2017 8:02 PM [name], DO

## 2017-11-06 ENCOUNTER — Encounter (HOSPITAL_COMMUNITY): Payer: Self-pay

## 2017-11-06 DIAGNOSIS — Z3A38 38 weeks gestation of pregnancy: Secondary | ICD-10-CM

## 2017-11-06 LAB — CBC
HCT: 30.2 % — ABNORMAL LOW (ref 36.0–46.0)
HEMOGLOBIN: 10.1 g/dL — AB (ref 12.0–15.0)
MCH: 28.8 pg (ref 26.0–34.0)
MCHC: 33.4 g/dL (ref 30.0–36.0)
MCV: 86 fL (ref 80.0–100.0)
Platelets: 148 10*3/uL — ABNORMAL LOW (ref 150–400)
RBC: 3.51 MIL/uL — AB (ref 3.87–5.11)
RDW: 12.2 % (ref 11.5–15.5)
WBC: 14 10*3/uL — AB (ref 4.0–10.5)
nRBC: 0 % (ref 0.0–0.2)

## 2017-11-06 LAB — RPR: RPR: NONREACTIVE

## 2017-11-06 MED ORDER — OXYCODONE-ACETAMINOPHEN 5-325 MG PO TABS
1.0000 | ORAL_TABLET | ORAL | Status: DC | PRN
  Administered 2017-11-06: 1 via ORAL
  Filled 2017-11-06: qty 1

## 2017-11-06 MED ORDER — ONDANSETRON HCL 4 MG PO TABS: 4.0000 mg | ORAL_TABLET | ORAL | Status: DC | PRN

## 2017-11-06 MED ORDER — SENNOSIDES-DOCUSATE SODIUM 8.6-50 MG PO TABS
2.0000 | ORAL_TABLET | ORAL | Status: DC
  Administered 2017-11-06: 2 via ORAL
  Filled 2017-11-06: qty 2

## 2017-11-06 MED ORDER — ONDANSETRON HCL 4 MG/2ML IJ SOLN: 4.0000 mg | INTRAMUSCULAR | Status: DC | PRN

## 2017-11-06 MED ORDER — ZOLPIDEM TARTRATE 5 MG PO TABS: 5.0000 mg | ORAL_TABLET | Freq: Every evening | ORAL | Status: DC | PRN

## 2017-11-06 MED ORDER — COCONUT OIL OIL
1.0000 "application " | TOPICAL_OIL | Status: DC | PRN
  Filled 2017-11-06: qty 120

## 2017-11-06 MED ORDER — DIBUCAINE 1 % RE OINT
1.0000 "application " | TOPICAL_OINTMENT | RECTAL | Status: DC | PRN
  Filled 2017-11-06: qty 28

## 2017-11-06 MED ORDER — IBUPROFEN 800 MG PO TABS
800.0000 mg | ORAL_TABLET | Freq: Four times a day (QID) | ORAL | Status: DC | PRN
  Administered 2017-11-06: 800 mg via ORAL
  Filled 2017-11-06: qty 1

## 2017-11-06 MED ORDER — DIPHENHYDRAMINE HCL 25 MG PO CAPS
25.0000 mg | ORAL_CAPSULE | Freq: Four times a day (QID) | ORAL | Status: DC | PRN
  Administered 2017-11-06: 25 mg via ORAL
  Filled 2017-11-06: qty 1

## 2017-11-06 MED ORDER — WITCH HAZEL-GLYCERIN EX PADS: 1.0000 "application " | MEDICATED_PAD | CUTANEOUS | Status: DC | PRN

## 2017-11-06 MED ORDER — ACETAMINOPHEN 325 MG PO TABS
650.0000 mg | ORAL_TABLET | ORAL | Status: DC | PRN
  Administered 2017-11-06 (×2): 650 mg via ORAL
  Filled 2017-11-06 (×2): qty 2

## 2017-11-06 MED ORDER — SIMETHICONE 80 MG PO CHEW: 80.0000 mg | CHEWABLE_TABLET | ORAL | Status: DC | PRN

## 2017-11-06 MED ORDER — OXYCODONE-ACETAMINOPHEN 5-325 MG PO TABS: 2.0000 | ORAL_TABLET | ORAL | Status: DC | PRN

## 2017-11-06 MED ORDER — IBUPROFEN 600 MG PO TABS
600.0000 mg | ORAL_TABLET | Freq: Four times a day (QID) | ORAL | Status: DC
  Administered 2017-11-06 – 2017-11-07 (×5): 600 mg via ORAL
  Filled 2017-11-06 (×5): qty 1

## 2017-11-06 MED ORDER — TETANUS-DIPHTH-ACELL PERTUSSIS 5-2.5-18.5 LF-MCG/0.5 IM SUSP: 0.5000 mL | Freq: Once | INTRAMUSCULAR | Status: DC

## 2017-11-06 MED ORDER — BENZOCAINE-MENTHOL 20-0.5 % EX AERO
1.0000 "application " | INHALATION_SPRAY | CUTANEOUS | Status: DC | PRN
  Filled 2017-11-06: qty 56

## 2017-11-06 MED ORDER — PRENATAL MULTIVITAMIN CH
1.0000 | ORAL_TABLET | Freq: Every day | ORAL | Status: DC
  Administered 2017-11-06 – 2017-11-07 (×2): 1 via ORAL
  Filled 2017-11-06 (×2): qty 1

## 2017-11-07 MED ORDER — ACETAMINOPHEN 325 MG PO TABS: 650.0000 mg | ORAL_TABLET | ORAL | 1 refills | Status: DC | PRN

## 2017-11-07 MED ORDER — SENNOSIDES-DOCUSATE SODIUM 8.6-50 MG PO TABS: 2.0000 | ORAL_TABLET | ORAL | 1 refills | Status: DC

## 2017-11-07 MED ORDER — DIBUCAINE 1 % RE OINT: 1.0000 "application " | TOPICAL_OINTMENT | RECTAL | 1 refills | Status: DC | PRN

## 2017-11-07 MED ORDER — ZOLPIDEM TARTRATE 5 MG PO TABS: 5.0000 mg | ORAL_TABLET | Freq: Every evening | ORAL | 0 refills | Status: DC | PRN

## 2017-11-07 MED ORDER — WITCH HAZEL-GLYCERIN EX PADS: 1.0000 "application " | MEDICATED_PAD | CUTANEOUS | 12 refills | Status: DC | PRN

## 2017-11-07 MED ORDER — BENZOCAINE-MENTHOL 20-0.5 % EX AERO: 1.0000 "application " | INHALATION_SPRAY | CUTANEOUS | 1 refills | Status: DC | PRN

## 2017-11-07 MED ORDER — TETANUS-DIPHTH-ACELL PERTUSSIS 5-2.5-18.5 LF-MCG/0.5 IM SUSP: 0.5000 mL | Freq: Once | INTRAMUSCULAR | 0 refills | Status: AC

## 2017-11-07 NOTE — Lactation Note (Signed)
This note was copied from a baby's chart. Lactation Consultation Note  Patient Name: Sheila Kirby [id number] Date: 11/07/2017 Reason for consult: Initial assessment;Other (Comment)(spoke to dad / mom in th shower / updatd the doc flow sheets )  LC will F/U after shower.   Maternal Data    Feeding Feeding Type: Breast Fed  LATCH Score                   Interventions Interventions: Breast feeding basics reviewed  Lactation Tools Discussed/Used     Consult Status Consult Status: Follow-up Date: 11/07/17 Follow-up type: In-patient    [name] 11/07/2017, 9:10 AM

## 2017-11-07 NOTE — Progress Notes (Addendum)
POSTPARTUM PROGRESS NOTE  Post Partum Day 1  Subjective:  Sheila Kirby is a 22 y.o. [id number] s/p SVD at [redacted]w[redacted]d d/t elective IOL.  She reports she is doing well. No acute events overnight. She c/o low back pain at location of epidural but denies headache. She denies any problems with ambulating, voiding or po intake. Denies nausea or vomiting.  Pain is moderately controlled.  Lochia is progressively decreasing, now mild. She indicates she wants an IP circ.   Objective: Blood pressure 119/73, pulse 63, temperature 97.7 F (36.5 C), temperature source Oral, resp. rate 18, height 5\' 9"  (1.753 m), weight 94.8 kg, last menstrual period 02/04/2017, SpO2 100 %, unknown if currently breastfeeding.  Physical Exam:  General: alert, cooperative and no distress Chest: no respiratory distress Heart:regular rate, distal pulses intact Abdomen: soft, nontender,  Uterine Fundus: firm, appropriately tender DVT Evaluation: No calf swelling or tenderness Extremities: no LE edema Skin: warm, dry  Recent Labs    11/05/17 1554 11/06/17 0617  HGB 11.8* 10.1*  HCT 35.6* 30.2*    Assessment/Plan: Sheila Kirby is a 23 y.o. [id number] s/p SVD at [redacted]w[redacted]d d/t elective IOL.  PPD#1 - Doing well  Routine postpartum care Back Pain: tylenol, ibuprofen Contraception: IUD Feeding: Breast Circ: Inpatient Dispo: Plan for discharge tomorrow.   LOS: 2 days   Matthew Saras, [facility] MS3 11/07/2017, 7:32 AM    I confirm that I have verified the information documented in the student's note and that I have also personally reperformed the physical exam and all medical decision making activities.    Luna Kitchens CNM

## 2017-11-07 NOTE — Discharge Summary (Addendum)
Obstetrics Discharge Summary OB/GYN Faculty Practice   Patient Name: Sheila Kirby DOB: 10-Feb-1995 MRN: [id number]  Date of admission: 11/05/2017 Delivering MD: [name]   Date of discharge: 11/07/2017  Admitting diagnosis: INDUCTION Intrauterine pregnancy: [redacted]w[redacted]d     Secondary diagnosis:   Active Problems:   Encounter for elective induction of labor     Discharge diagnosis: Term Pregnancy Delivered                                            Postpartum procedures: None  Complications: None  Hospital course: Sheila Kirby is a 22 y.o. [redacted]w[redacted]d who was admitted for elective IOl. Her pregnancy was uncomplicated. Her labor course was notable for augmentation with AROM and pitocin. Delivery was complicated by labial laceration with repair with 3.0 vicryl. Please see delivery/op note for additional details. Her postpartum course was uncomplicated. She was breastfeeding without difficulty. By day of discharge, she was passing flatus, urinating, eating and drinking without difficulty. Her pain was well-controlled, and she was discharged home with tylenol and ibuprofem. She will follow-up in clinic in 4 weeks.   Physical exam  Vitals:   11/06/17 0500 11/06/17 1504 11/06/17 2100 11/07/17 0541  BP: 130/67 117/69 120/69 119/73  Pulse: 71 63 76 63  Resp: 16 16 16 18   Temp: 98.1 F (36.7 C) 97.8 F (36.6 C)  97.7 F (36.5 C)  TempSrc: Oral Oral  Oral  SpO2: 98%   100%  Weight:      Height:       Labs: Lab Results  Component Value Date   WBC 14.0 (H) 11/06/2017   HGB 10.1 (L) 11/06/2017   HCT 30.2 (L) 11/06/2017   MCV 86.0 11/06/2017   PLT 148 (L) 11/06/2017   No flowsheet data found.  Discharge instructions: Per After Visit Summary and "Baby and Me Booklet"  After visit meds:  Allergies as of 11/07/2017   No Known Allergies     Medication List    STOP taking these medications   pantoprazole 40 MG tablet Commonly known as:  PROTONIX   ranitidine 150 MG tablet Commonly  known as:  ZANTAC     TAKE these medications   acetaminophen 325 MG tablet Commonly known as:  TYLENOL Take 2 tablets (650 mg total) by mouth every 4 (four) hours as needed (for pain scale < 4).   benzocaine-Menthol 20-0.5 % Aero Commonly known as:  DERMOPLAST Apply 1 application topically as needed for irritation (perineal discomfort).   dibucaine 1 % Oint Commonly known as:  NUPERCAINAL Place 1 application rectally as needed for hemorrhoids.   senna-docusate 8.6-50 MG tablet Commonly known as:  Senokot-S Take 2 tablets by mouth daily. Start taking on:  11/08/2017   Tdap 5-2.5-18.5 LF-MCG/0.5 injection Commonly known as:  BOOSTRIX Inject 0.5 mLs into the muscle once for 1 dose.   witch hazel-glycerin pad Commonly known as:  TUCKS Apply 1 application topically as needed for hemorrhoids.   zolpidem 5 MG tablet Commonly known as:  AMBIEN Take 1 tablet (5 mg total) by mouth at bedtime as needed for sleep.      Postpartum contraception: IUD Diet: Routine Diet Activity: Advance as tolerated. Pelvic rest for 6 weeks.   Outpatient follow up:4 weeks Follow-up Appt: Future Appointments  Date Time Provider Department Center  12/12/2017  9:00 AM [name], DO [facility] None  Follow-up Visit:No follow-ups on file.  Newborn Data: Live born female  Birth Weight: 7 lb 10.9 oz (3485 g) APGAR: 8, 9  Newborn Delivery   Birth date/time:  11/06/2017 01:39:00 Delivery type:  Vaginal, Spontaneous     Baby Feeding: Breast Disposition:home with mother  Burman Nieves, MD Family Medicine Resident    I confirm that I have verified the information documented in the resident's note and that I have also personally reperformed the physical exam and all medical decision making activities.   Luna Kitchens CNM

## 2017-11-07 NOTE — Lactation Note (Signed)
This note was copied from a baby's chart. Lactation Consultation Note  Patient Name: Sheila Kirby [id number] Date: 11/07/2017 Reason for consult: Follow-up assessment(early D/C )  Baby is 37 hours old  LC reviewed the doc flow earlier and updated per dad.  Baby awake and hungry as LC entered the room.  Per mom felt she needed a NS on the left breast due to semi inverted  Nipple. LC assessed with moms permission and noted the areola to be  Compressible. LC taught mom how to roll the nipple and latch with tea hold  Position.  LC offered to assist to latch on the left breast / football / and baby latched  With depth and fed for 10 mins , multiple swallows and the baby was able to sustain  The latch/ release on his own. Nipple well rounded.  Sore nipple and engorgement prevention and tx reviewed.  LC instructed on the use shells between feeding except when sleeping.  Mom has HAAKA hand pump and DEBP at home. LC recommended STS feedings  Until the baby can stay awake for a feeding, and gaining well.  Milk is in and breast are full , not engorged.  Mother informed of post-discharge support and given phone number to the lactation department, including services for phone call assistance; out-patient appointments; and breastfeeding support group. List of other breastfeeding resources in the community given in the handout. Encouraged mother to call for problems or concerns related to breastfeeding.   Maternal Data Has patient been taught Hand Expression?: Yes Does the patient have breastfeeding experience prior to this delivery?: Yes  Feeding Feeding Type: Breast Fed  LATCH Score Latch: Grasps breast easily, tongue down, lips flanged, rhythmical sucking.  Audible Swallowing: Spontaneous and intermittent  Type of Nipple: Everted at rest and after stimulation  Comfort (Breast/Nipple): Filling, red/small blisters or bruises, mild/mod discomfort  Hold (Positioning): No assistance needed to  correctly position infant at breast.  LATCH Score: 9  Interventions Interventions: Breast feeding basics reviewed;Assisted with latch;Skin to skin;Breast massage;Hand express;Reverse pressure;Breast compression;Adjust position;Support pillows;Position options  Lactation Tools Discussed/Used Tools: Shells Shell Type: Inverted WIC Program: No Pump Review: Milk Storage   Consult Status Consult Status: Complete Date: 11/07/17 Follow-up type: In-patient    [name] 11/07/2017, 10:43 AM

## 2017-11-08 NOTE — Anesthesia Postprocedure Evaluation (Signed)
Anesthesia Post Note  Patient: Sheila Kirby  Procedure(s) Performed: AN AD HOC LABOR EPIDURAL     Anesthesia Type: Epidural Comments: Patient discharged home with no apparent anesthesia complications.    Last Vitals: There were no vitals filed for this visit.  Last Pain: There were no vitals filed for this visit.               Lucretia Kern

## 2017-12-12 ENCOUNTER — Ambulatory Visit (INDEPENDENT_AMBULATORY_CARE_PROVIDER_SITE_OTHER): Payer: Managed Care, Other (non HMO) | Admitting: Family Medicine

## 2017-12-12 ENCOUNTER — Encounter: Payer: Self-pay | Admitting: Family Medicine

## 2017-12-12 NOTE — Progress Notes (Signed)
Post Partum Exam  Kara PacerMacie R Steen is a 22 y.o. [id number] female who presents for a postpartum visit. She is 5 weeks postpartum following a spontaneous vaginal delivery. I have fully reviewed the prenatal and intrapartum course. The delivery was at 39 gestational weeks.  Anesthesia: epidural. Postpartum course has been uneventful. Baby's course has been uneventful. Baby is feeding by breast. Bleeding no bleeding. Bowel function is normal. Bladder function is normal. Patient is sexually active. Contraception method is condoms. Postpartum depression screening:neg  The following portions of the patient's history were reviewed and updated as appropriate: allergies, current medications, past family history, past medical history, past social history, past surgical history and problem list. Last pap smear done09-28-18 and was Normal  Review of Systems Pertinent items are noted in HPI.    Objective:  unknown if currently breastfeeding.  General:  alert, cooperative and no distress  Lungs: clear to auscultation bilaterally  Heart:  regular rate and rhythm, S1, S2 normal, no murmur, click, rub or gallop  Abdomen: soft, non-tender; bowel sounds normal; no masses,  no organomegaly        Assessment:    Normal postpartum exam.    Plan:   1. Contraception: condoms - wants to avoid hormones at this point. 2. Follow up in: 3 months or as needed.

## 2017-12-16 DIAGNOSIS — Z3482 Encounter for supervision of other normal pregnancy, second trimester: Secondary | ICD-10-CM | POA: Diagnosis not present

## 2017-12-16 DIAGNOSIS — Z3483 Encounter for supervision of other normal pregnancy, third trimester: Secondary | ICD-10-CM | POA: Diagnosis not present

## 2018-01-23 ENCOUNTER — Encounter: Payer: Self-pay | Admitting: Family Medicine

## 2018-01-23 ENCOUNTER — Ambulatory Visit (INDEPENDENT_AMBULATORY_CARE_PROVIDER_SITE_OTHER): Payer: Managed Care, Other (non HMO) | Admitting: Family Medicine

## 2018-01-23 VITALS — BP 127/76 | HR 90 | Ht 68.0 in | Wt 190.1 lb

## 2018-01-23 DIAGNOSIS — M9903 Segmental and somatic dysfunction of lumbar region: Secondary | ICD-10-CM

## 2018-01-23 DIAGNOSIS — M545 Low back pain: Secondary | ICD-10-CM | POA: Diagnosis not present

## 2018-01-23 DIAGNOSIS — Z9189 Other specified personal risk factors, not elsewhere classified: Secondary | ICD-10-CM | POA: Diagnosis not present

## 2018-01-23 DIAGNOSIS — Z3483 Encounter for supervision of other normal pregnancy, third trimester: Secondary | ICD-10-CM | POA: Diagnosis not present

## 2018-01-23 DIAGNOSIS — G8929 Other chronic pain: Secondary | ICD-10-CM

## 2018-01-23 DIAGNOSIS — Z3482 Encounter for supervision of other normal pregnancy, second trimester: Secondary | ICD-10-CM | POA: Diagnosis not present

## 2018-01-23 NOTE — Progress Notes (Signed)
   Subjective:    Patient ID: Sheila Kirby, female    DOB: Jan 20, 1995, 23 y.o.   MRN: [id number]  HPI Patient seen with nonradiating midline back pain around the area where she had the epidural. She has pain intermittently that is fairly severe, but self limiting after several seconds. However, the area always remains sensitive to palpation. No other palliating or provoking factors. No fevers, chills, abdominal pain, LE weakness, numbness, etc. Has been occurring since delivery.  She's also concerned about her milk supply, which has decreased. She's currently pumping and not breastfeeding. She's taking a couple of supplements, which doesn't help much. She not currently on contraception - her milk supply decreased when she had the IUD placed the last time she was pregnant.   Review of Systems     Objective:   Physical Exam Constitutional:      Appearance: Normal appearance.  Musculoskeletal:        General: No swelling or deformity.     Comments: Tenderness to midvertebral space around L3-4. Paraspinal spasm in lumbar spine  Skin:    General: Skin is warm.     Capillary Refill: Capillary refill takes less than 2 seconds.  Neurological:     General: No focal deficit present.     Mental Status: She is alert.     Cranial Nerves: No cranial nerve deficit.     Sensory: No sensory deficit.     Motor: No weakness.     Coordination: Coordination normal.     Gait: Gait normal.     Deep Tendon Reflexes: Reflexes normal.       Assessment & Plan:  1. Chronic midline low back pain without sciatica Likely site sensitivity following epidural. As no weakness or numbness, unlikely to be impinged or damaged nerve. Will trial OMT to see if improves. If not, may need to image area to evaluate for further injury. 2. Somatic dysfunction of lumbar region OMT done after patient permission. HVLA technique utilized. 3 areas treated with improvement of tissue texture and joint mobility. Patient tolerated  procedure well.    3. Breastfeeding problem Continue pumping, supplements. Discussed may need to breastfeed periodically to provide better stimulation for milk supply. Discussed hormonal effects on breastfeeding - she would like to remain of hormonal contraception to avoid decreasing milk supply further.

## 2018-02-06 ENCOUNTER — Ambulatory Visit (INDEPENDENT_AMBULATORY_CARE_PROVIDER_SITE_OTHER): Payer: Managed Care, Other (non HMO) | Admitting: Family Medicine

## 2018-02-06 VITALS — BP 115/74 | HR 87 | Wt 190.0 lb

## 2018-02-06 DIAGNOSIS — M545 Low back pain: Secondary | ICD-10-CM | POA: Diagnosis not present

## 2018-02-06 DIAGNOSIS — G8929 Other chronic pain: Secondary | ICD-10-CM | POA: Diagnosis not present

## 2018-02-06 DIAGNOSIS — M9903 Segmental and somatic dysfunction of lumbar region: Secondary | ICD-10-CM | POA: Diagnosis not present

## 2018-02-06 NOTE — Progress Notes (Signed)
   Subjective:    Patient ID: Sheila Kirby, female    DOB: 17-Apr-1995, 23 y.o.   MRN: [id number]  HPI  Patient seen for f/u of back pain. Feels that OMT last appointment was helpful in reducing the pain for about a week, then pain started to increase again. Overall, pain is improved. No radiation currently.   No fevers, chills, weakness, paresthesias.   Review of Systems     Objective:   Physical Exam Constitutional:      Appearance: Normal appearance.  HENT:     Head: Normocephalic and atraumatic.  Cardiovascular:     Rate and Rhythm: Normal rate and regular rhythm.  Abdominal:     General: Abdomen is flat. There is no distension.     Palpations: Abdomen is soft.  Musculoskeletal: Normal range of motion.     Comments: Tenderness to midvertebral space around L3-4. Paraspinal spasm in lumbar spine.  OSE: L3/4 ESRL, L/L torsion, right ant innom  Skin:    General: Skin is warm and dry.     Capillary Refill: Capillary refill takes 2 to 3 seconds.  Neurological:     General: No focal deficit present.     Mental Status: She is alert.     Cranial Nerves: No cranial nerve deficit.     Motor: No weakness.       Assessment & Plan:  1. Chronic midline low back pain without sciatica 2. Somatic dysfunction of lumbar region OMT done after patient permission. HVLA technique utilized. 3 areas treated with improvement of tissue texture and joint mobility. Patient tolerated procedure well.

## 2018-02-16 ENCOUNTER — Ambulatory Visit (HOSPITAL_COMMUNITY)
Admission: EM | Admit: 2018-02-16 | Discharge: 2018-02-16 | Disposition: A | Discharge status: Home / Self Care | Payer: Managed Care, Other (non HMO) | Attending: Family Medicine | Admitting: Family Medicine

## 2018-02-16 ENCOUNTER — Other Ambulatory Visit: Payer: Self-pay

## 2018-02-16 ENCOUNTER — Encounter (HOSPITAL_COMMUNITY): Payer: Self-pay

## 2018-02-16 DIAGNOSIS — J020 Streptococcal pharyngitis: Secondary | ICD-10-CM

## 2018-02-16 LAB — POCT RAPID STREP A: Streptococcus, Group A Screen (Direct): POSITIVE — AB

## 2018-02-16 MED ORDER — AMOXICILLIN 500 MG PO CAPS: 500.0000 mg | ORAL_CAPSULE | Freq: Two times a day (BID) | ORAL | 0 refills | Status: AC

## 2018-02-16 NOTE — ED Triage Notes (Signed)
Pt cc fever , chills, and body aches. This started last night.

## 2018-02-16 NOTE — ED Provider Notes (Signed)
[facility]   [id number] 02/16/18 Arrival Time: 1357   CC: URI symptoms   SUBJECTIVE: History from: patient.  Sheila Kirby is a 23 y.o. female who presents with abrupt onset of nasal congestion, sore throat, PND and fever x 1 day.  Admits to sick exposure to husband with similar symptoms.  Has tried tylenol and ibuprofen with minimal relief.  Reports previous symptoms in the past and diagnosed with strep. Complains of associated fatigue.  Denies sinus pain, SOB, wheezing, chest pain, nausea, changes in bowel or bladder habits.    Received flu shot this year: yes.  ROS: As per HPI.  Past Medical History:  Diagnosis Date  . Medical history non-contributory    Past Surgical History:  Procedure Laterality Date  . NO PAST SURGERIES    . WISDOM TOOTH EXTRACTION     No Known Allergies No current facility-administered medications on file prior to encounter.    No current outpatient medications on file prior to encounter.   Social History   Socioeconomic History  . Marital status: Married    Spouse name: Not on file  . Number of children: Not on file  . Years of education: Not on file  . Highest education level: Not on file  Occupational History  . Not on file  Social Needs  . Financial resource strain: Not on file  . Food insecurity:    Worry: Not on file    Inability: Not on file  . Transportation needs:    Medical: Not on file    Non-medical: Not on file  Tobacco Use  . Smoking status: Never Smoker  . Smokeless tobacco: Never Used  Substance and Sexual Activity  . Alcohol use: No  . Drug use: No  . Sexual activity: Yes    Birth control/protection: None  Lifestyle  . Physical activity:    Days per week: Not on file    Minutes per session: Not on file  . Stress: Not on file  Relationships  . Social connections:    Talks on phone: Not on file    Gets together: Not on file    Attends religious service: Not on file    Active member of club or  organization: Not on file    Attends meetings of clubs or organizations: Not on file    Relationship status: Not on file  . Intimate partner violence:    Fear of current or ex partner: Not on file    Emotionally abused: Not on file    Physically abused: Not on file    Forced sexual activity: Not on file  Other Topics Concern  . Not on file  Social History Narrative  . Not on file   Family History  Problem Relation Age of Onset  . Hypertension Mother   . Diabetes Father   . Hypertension Father   . Cancer Sister        skin cancer  . Cancer Maternal Grandfather        skin  . Cancer Paternal Grandfather        renal  . Spina bifida Paternal Uncle   . Stroke Neg Hx   . Miscarriages / Stillbirths Neg Hx   . Mental retardation Neg Hx     OBJECTIVE:  Vitals:   02/16/18 1522 02/16/18 1523  BP: 120/69   Pulse: 99   Resp: 18   Temp: (!) 100.8 F (38.2 C)   TempSrc: Tympanic   SpO2: 100%  Weight:  190 lb (86.2 kg)     General appearance: alert; appears mildly fatigued, but nontoxic; speaking in full sentences and tolerating own secretions HEENT: NCAT; Ears: EACs clear, TMs pearly gray; Eyes: PERRL.  EOM grossly intact. Nose: nares patent without rhinorrhea, Throat: oropharynx clear, tonsils mildly erythematous, slightly enlarged, uvula midline  Neck: supple without LAD Lungs: unlabored respirations, symmetrical air entry; cough: absent; no respiratory distress; CTAB Heart: regular rate and rhythm.  Radial pulses 2+ symmetrical bilaterally Skin: warm and dry Psychological: alert and cooperative; normal mood and affect  LABS:   Results for orders placed or performed during the hospital encounter of 02/16/18 (from the past 24 hour(s))  POCT rapid strep A [facility])     Status: Abnormal   Collection Time: 02/16/18  3:47 PM  Result Value Ref Range   Streptococcus, Group A Screen (Direct) POSITIVE (A) NEGATIVE     ASSESSMENT & PLAN:  1. Strep pharyngitis      Meds ordered this encounter  Medications  . amoxicillin (AMOXIL) 500 MG capsule    Sig: Take 1 capsule (500 mg total) by mouth 2 (two) times daily for 10 days.    Dispense:  20 capsule    Refill:  0    Order Specific Question:   Supervising Provider    Answer:   Eustace Moore [[id number]]    Strep positive  Rest and push fluids Amoxicillin prescribed.  Take as directed and to completion.  This medication is safe in breastfeeding but monitor for GI disturbances, such as thrush or diarrhea in infant Continue with OTC ibuprofen and/or tylenol as needed for pain and fever Follow up with PCP if symptoms persists Return or go to ER if you have any new or worsening symptoms fever, chills, nausea, vomiting, chest pain, cough, shortness of breath, wheezing, abdominal pain, changes in bowel or bladder habits, etc...  Reviewed expectations re: course of current medical issues. Questions answered. Outlined signs and symptoms indicating need for more acute intervention. Patient verbalized understanding. After Visit Summary given.         Rennis Harding, PA-C 02/16/18 1558

## 2018-02-16 NOTE — Discharge Instructions (Signed)
Strep positive  Rest and push fluids Amoxicillin prescribed.  Take as directed and to completion.  This medication is safe in breastfeeding but monitor for GI disturbances, such as thrush or diarrhea in infant Continue with OTC ibuprofen and/or tylenol as needed for pain and fever Follow up with PCP if symptoms persists Return or go to ER if you have any new or worsening symptoms fever, chills, nausea, vomiting, chest pain, cough, shortness of breath, wheezing, abdominal pain, changes in bowel or bladder habits, etc..Marland Kitchen

## 2018-02-20 ENCOUNTER — Ambulatory Visit (INDEPENDENT_AMBULATORY_CARE_PROVIDER_SITE_OTHER): Payer: Managed Care, Other (non HMO) | Admitting: Family Medicine

## 2018-02-20 ENCOUNTER — Ambulatory Visit: Payer: Managed Care, Other (non HMO) | Admitting: Family Medicine

## 2018-02-20 ENCOUNTER — Encounter: Payer: Self-pay | Admitting: Family Medicine

## 2018-02-20 VITALS — BP 123/66 | HR 89 | Ht 68.0 in | Wt 193.1 lb

## 2018-02-20 DIAGNOSIS — M9903 Segmental and somatic dysfunction of lumbar region: Secondary | ICD-10-CM

## 2018-02-20 DIAGNOSIS — G8929 Other chronic pain: Secondary | ICD-10-CM | POA: Diagnosis not present

## 2018-02-20 DIAGNOSIS — M545 Low back pain, unspecified: Secondary | ICD-10-CM

## 2018-02-20 NOTE — Progress Notes (Signed)
   Subjective:    Patient ID: Sheila Kirby, female    DOB: Oct 26, 1995, 23 y.o.   MRN: [id number]  HPI Follow up of back pain after epidural during labor. Continues to have some pain/pressure with palpation to that area. OMT helpful for the acute episodic pain, but continues to have that constant pressure. "Feels like a bubble" and becomes somewhat nauseated when that area is palpated. No fevers, chills, nausea, vomiting.   Review of Systems     Objective:   Physical Exam Constitutional:      Appearance: Normal appearance.  HENT:     Head: Normocephalic.  Cardiovascular:     Rate and Rhythm: Normal rate.  Pulmonary:     Effort: Pulmonary effort is normal.  Abdominal:     General: Abdomen is flat. Bowel sounds are normal.     Palpations: Abdomen is soft.  Musculoskeletal: Normal range of motion.        General: No swelling or deformity.     Comments: Tenderness around L3-L4 and L4-5 vertebral spaces  OSE: L3/4 ESRL, L/L torsion, right ant innom   Skin:    General: Skin is warm and dry.     Capillary Refill: Capillary refill takes less than 2 seconds.  Neurological:     General: No focal deficit present.     Mental Status: She is alert.       Assessment & Plan:  1. Chronic midline low back pain without sciatica Will check MRI to evaluate for injury - MR LUMBAR SPINE WO CONTRAST; Future  2. Somatic dysfunction of lumbar region OMT done after patient permission. HVLA technique utilized. 3 areas treated with improvement of tissue texture and joint mobility. Patient tolerated procedure well.

## 2018-02-27 DIAGNOSIS — Z3483 Encounter for supervision of other normal pregnancy, third trimester: Secondary | ICD-10-CM | POA: Diagnosis not present

## 2018-02-27 DIAGNOSIS — Z3482 Encounter for supervision of other normal pregnancy, second trimester: Secondary | ICD-10-CM | POA: Diagnosis not present

## 2018-03-08 ENCOUNTER — Ambulatory Visit (HOSPITAL_BASED_OUTPATIENT_CLINIC_OR_DEPARTMENT_OTHER)
Admission: RE | Admit: 2018-03-08 | Discharge: 2018-03-08 | Disposition: A | Discharge status: Home / Self Care | Payer: 59 | Source: Ambulatory Visit | Attending: Family Medicine | Admitting: Family Medicine

## 2018-03-08 DIAGNOSIS — M5126 Other intervertebral disc displacement, lumbar region: Secondary | ICD-10-CM | POA: Diagnosis not present

## 2018-03-08 DIAGNOSIS — M4807 Spinal stenosis, lumbosacral region: Secondary | ICD-10-CM | POA: Diagnosis not present

## 2018-03-08 DIAGNOSIS — M545 Low back pain: Secondary | ICD-10-CM | POA: Insufficient documentation

## 2018-03-08 DIAGNOSIS — M5127 Other intervertebral disc displacement, lumbosacral region: Secondary | ICD-10-CM | POA: Diagnosis not present

## 2018-03-08 DIAGNOSIS — G8929 Other chronic pain: Secondary | ICD-10-CM | POA: Diagnosis not present

## 2018-03-28 ENCOUNTER — Telehealth: Payer: 59 | Admitting: Nurse Practitioner

## 2018-03-28 DIAGNOSIS — H1031 Unspecified acute conjunctivitis, right eye: Secondary | ICD-10-CM

## 2018-03-28 MED ORDER — POLYMYXIN B-TRIMETHOPRIM 10000-0.1 UNIT/ML-% OP SOLN: 2.0000 [drp] | Freq: Four times a day (QID) | OPHTHALMIC | 0 refills | Status: DC

## 2018-03-28 MED FILL — POLYMYXIN B/TMP EYE DROPS: 10000-0.1 | 25 days supply | Qty: 10 | Fill #0

## 2018-03-28 NOTE — Progress Notes (Signed)
We are sorry that you are not feeling well.  Here is how we plan to help!  Based on what you have shared with me it looks like you have conjunctivitis.  Conjunctivitis is a common inflammatory or infectious condition of the eye that is often referred to as "pink eye".  In most cases it is contagious (viral or bacterial). However, not all conjunctivitis requires antibiotics (ex. Allergic).  We have made appropriate suggestions for you based upon your presentation.  I have prescribed Polytrim Ophthalmic drops 1-2 drops 4 times a day times 5 days  Pink eye can be highly contagious.  It is typically spread through direct contact with secretions, or contaminated objects or surfaces that one may have touched.  Strict handwashing is suggested with soap and water is urged.  If not available, use alcohol based had sanitizer.  Avoid unnecessary touching of the eye.  If you wear contact lenses, you will need to refrain from wearing them until you see no white discharge from the eye for at least 24 hours after being on medication.  You should see symptom improvement in 1-2 days after starting the medication regimen.  Call us if symptoms are not improved in 1-2 days.  Home Care:  Wash your hands often!  Do not wear your contacts until you complete your treatment plan.  Avoid sharing towels, bed linen, personal items with a person who has pink eye.  See attention for anyone in your home with similar symptoms.  Get Help Right Away If:  Your symptoms do not improve.  You develop blurred or loss of vision.  Your symptoms worsen (increased discharge, pain or redness)  Your e-visit answers were reviewed by a board certified advanced clinical practitioner to complete your personal care plan.  Depending on the condition, your plan could have included both over the counter or prescription medications.  If there is a problem please reply  once you have received a response from your provider.  Your safety is  important to us.  If you have drug allergies check your prescription carefully.    You can use MyChart to ask questions about today's visit, request a non-urgent call back, or ask for a work or school excuse for 24 hours related to this e-Visit. If it has been greater than 24 hours you will need to follow up with your provider, or enter a new e-Visit to address those concerns.   You will get an e-mail in the next two days asking about your experience.  I hope that your e-visit has been valuable and will speed your recovery. Thank you for using e-visits.    5 minutes spent reviewing and documenting in chart.  

## 2018-06-04 ENCOUNTER — Telehealth: Payer: Self-pay

## 2018-06-04 NOTE — Telephone Encounter (Signed)
Patient called and made appointment for New OB visit. [name] RN

## 2018-06-16 ENCOUNTER — Encounter: Payer: Self-pay | Admitting: Family Medicine

## 2018-06-16 ENCOUNTER — Ambulatory Visit (INDEPENDENT_AMBULATORY_CARE_PROVIDER_SITE_OTHER): Payer: 59 | Admitting: Family Medicine

## 2018-06-16 ENCOUNTER — Other Ambulatory Visit: Payer: Self-pay

## 2018-06-16 VITALS — BP 123/57 | HR 95

## 2018-06-16 DIAGNOSIS — Z3480 Encounter for supervision of other normal pregnancy, unspecified trimester: Secondary | ICD-10-CM | POA: Diagnosis not present

## 2018-06-16 DIAGNOSIS — O09891 Supervision of other high risk pregnancies, first trimester: Secondary | ICD-10-CM

## 2018-06-16 DIAGNOSIS — Z3A09 9 weeks gestation of pregnancy: Secondary | ICD-10-CM

## 2018-06-16 DIAGNOSIS — O09899 Supervision of other high risk pregnancies, unspecified trimester: Secondary | ICD-10-CM

## 2018-06-16 LAB — POCT URINALYSIS DIPSTICK OB
Blood, UA: NEGATIVE
Glucose, UA: NEGATIVE
Leukocytes, UA: NEGATIVE
Nitrite, UA: NEGATIVE
Spec Grav, UA: 1.015 (ref 1.010–1.025)
pH, UA: 6.5 (ref 5.0–8.0)

## 2018-06-16 NOTE — Progress Notes (Signed)
Subjective:  Sheila Kirby is a [id number] [redacted]w[redacted]d being seen today for her first obstetrical visit.  Her obstetrical history is significant for 2 previous low risk pregnancies with vaginal deliveries, short interval between pregnancies. FOB is involved and is patient's husband: Sheila Kirby. Patient does intend to breast feed. Pregnancy history fully reviewed.  Patient reports nausea.  BP (!) 123/57   Pulse 95   LMP 04/12/2018   HISTORY: OB History  Gravida Para Term Preterm AB Living  3 2 2     2   SAB TAB Ectopic Multiple Live Births        0 2    # Outcome Date GA Lbr Len/2nd Weight Sex Delivery Anes PTL Lv  3 Current           2 Term 11/06/17 [redacted]w[redacted]d 03:55 / 01:44 7 lb 10.9 oz (3.485 kg) M Vag-Spont EPI  LIV  1 Term 06/23/15 [redacted]w[redacted]d 03:15 / 01:38 7 lb 3.5 oz (3.274 kg) F Vag-Spont EPI  LIV    Past Medical History:  Diagnosis Date  . Medical history non-contributory     Past Surgical History:  Procedure Laterality Date  . NO PAST SURGERIES    . WISDOM TOOTH EXTRACTION      Family History  Problem Relation Age of Onset  . Hypertension Mother   . Diabetes Father   . Hypertension Father   . Cancer Sister        skin cancer  . Cancer Maternal Grandfather        skin  . Cancer Paternal Grandfather        renal  . Spina bifida Paternal Uncle   . Stroke Neg Hx   . Miscarriages / Stillbirths Neg Hx   . Mental retardation Neg Hx      Exam  BP (!) 123/57   Pulse 95   LMP 04/12/2018   CONSTITUTIONAL: Well-developed, well-nourished female in no acute distress.  HENT:  Normocephalic, atraumatic, External right and left ear normal. Oropharynx is clear and moist EYES: Conjunctivae and EOM are normal. Pupils are equal, round, and reactive to light. No scleral icterus.  NECK: Normal range of motion, supple, no masses.  Normal thyroid.  CARDIOVASCULAR: Normal heart rate noted, regular rhythm RESPIRATORY: Clear to auscultation bilaterally. Effort and breath sounds normal, no problems  with respiration noted. BREASTS: Symmetric in size. No masses, skin changes, nipple drainage, or lymphadenopathy. ABDOMEN: Soft, normal bowel sounds, no distention noted.  No tenderness, rebound or guarding.  PELVIC: Normal appearing external genitalia; normal appearing vaginal mucosa and cervix. No abnormal discharge noted. Normal uterine size, no other palpable masses, no uterine or adnexal tenderness. MUSCULOSKELETAL: Normal range of motion. No tenderness.  No cyanosis, clubbing, or edema.  2+ distal pulses. SKIN: Skin is warm and dry. No rash noted. Not diaphoretic. No erythema. No pallor. NEUROLOGIC: Alert and oriented to person, place, and time. Normal reflexes, muscle tone coordination. No cranial nerve deficit noted. PSYCHIATRIC: Normal mood and affect. Normal behavior. Normal judgment and thought content.    Assessment:    Pregnancy: [id number] Patient Active Problem List   Diagnosis Date Noted  . Supervision of other normal pregnancy, antepartum 06/16/2018  . Family history of spina bifida 11/15/2014      Plan:  1. Supervision of other normal pregnancy, antepartum FHT and FH normal - Obstetric Panel, Including HIV - Urine Culture - Enroll Patient in Babyscripts - Babyscripts Schedule Optimization - POC Urinalysis Dipstick OB - GC/Chlamydia probe amp ([place])not at [facility]  2. Short interval between pregnancies affecting pregnancy, antepartum Discussed PTL and low birth weight risk, along with PP depression risk. Will watch closely.  Problem list reviewed and updated. 75% of 30 min visit spent on counseling and coordination of care.     Levie HeritageJacob J  06/16/2018

## 2018-06-16 NOTE — Progress Notes (Signed)
DATING AND VIABILITY SONOGRAM   Sheila Kirby is a 23 y.o. year old [id number] with LMP Patient's last menstrual period was 04/12/2018. which would correlate to  [redacted]w[redacted]d weeks gestation.  She has regular menstrual cycles.   She is here today for a confirmatory initial sonogram.    GESTATION: SINGLETON  FETAL ACTIVITY:          Heart rate        173          The fetus is active.      GESTATIONAL AGE AND  BIOMETRICS:  Gestational criteria: Estimated Date of Delivery: 01/17/19 by LMP now at [redacted]w[redacted]d  Previous Scans:0      CROWN RUMP LENGTH          2.45 cm         9-1 weeks                                                                               AVERAGE EGA(BY THIS SCAN):  9-1 weeks  WORKING EDD( LMP ):  01-17-2019     TECHNICIAN COMMENTS: Patient informed that the ultrasound is considered a limited obstetric ultrasound and is not intended to be a complete ultrasound exam. Patient also informed that the ultrasound is not being completed with the intent of assessing for fetal or placental anomalies or any pelvic abnormalities. Explained that the purpose of today's ultrasound is to assess for fetal heart rate. Patient acknowledges the purpose of the exam and the limitations of the study.   Kathrene Alu 06/16/2018 9:36 AM

## 2018-06-18 LAB — OBSTETRIC PANEL, INCLUDING HIV
Antibody Screen: NEGATIVE
Basophils Absolute: 0 10*3/uL (ref 0.0–0.2)
Basos: 0 %
EOS (ABSOLUTE): 0 10*3/uL (ref 0.0–0.4)
Eos: 0 %
HIV Screen 4th Generation wRfx: NONREACTIVE
Hematocrit: 40.2 % (ref 34.0–46.6)
Hemoglobin: 13.5 g/dL (ref 11.1–15.9)
Hepatitis B Surface Ag: NEGATIVE
Immature Grans (Abs): 0 10*3/uL (ref 0.0–0.1)
Immature Granulocytes: 0 %
Lymphocytes Absolute: 1.4 10*3/uL (ref 0.7–3.1)
Lymphs: 15 %
MCH: 28.9 pg (ref 26.6–33.0)
MCHC: 33.6 g/dL (ref 31.5–35.7)
MCV: 86 fL (ref 79–97)
Monocytes Absolute: 0.4 10*3/uL (ref 0.1–0.9)
Monocytes: 4 %
Neutrophils Absolute: 7.5 10*3/uL — ABNORMAL HIGH (ref 1.4–7.0)
Neutrophils: 81 %
Platelets: 268 10*3/uL (ref 150–450)
RBC: 4.67 x10E6/uL (ref 3.77–5.28)
RDW: 12.2 % (ref 11.7–15.4)
RPR Ser Ql: NONREACTIVE
Rh Factor: POSITIVE
Rubella Antibodies, IGG: 3.43 index (ref >0.99)
WBC: 9.4 10*3/uL (ref 3.4–10.8)

## 2018-06-18 LAB — GC/CHLAMYDIA PROBE AMP (~~LOC~~) NOT AT ARMC
Chlamydia: NEGATIVE
Neisseria Gonorrhea: NEGATIVE

## 2018-06-18 LAB — URINE CULTURE: Organism ID, Bacteria: NO GROWTH

## 2018-06-20 ENCOUNTER — Other Ambulatory Visit: Payer: Self-pay

## 2018-06-20 DIAGNOSIS — Z20822 Contact with and (suspected) exposure to covid-19: Secondary | ICD-10-CM

## 2018-06-22 LAB — NOVEL CORONAVIRUS, NAA: SARS-CoV-2, NAA: NOT DETECTED

## 2018-06-29 ENCOUNTER — Telehealth: Payer: 59 | Admitting: Family

## 2018-06-29 DIAGNOSIS — L03031 Cellulitis of right toe: Secondary | ICD-10-CM

## 2018-06-29 MED ORDER — SULFAMETHOXAZOLE-TRIMETHOPRIM 800-160 MG PO TABS: 1.0000 | ORAL_TABLET | Freq: Two times a day (BID) | ORAL | 0 refills | Status: DC

## 2018-06-29 NOTE — Progress Notes (Signed)
E Visit for Cellulitis  We are sorry that you are not feeling well. Here is how we plan to help!  Based on what you shared with me it looks like you have cellulitis.  Cellulitis looks like areas of skin redness, swelling, and warmth; it develops as a result of bacteria entering under the skin. Little red spots and/or bleeding can be seen in skin, and tiny surface sacs containing fluid can occur. Fever can be present. Cellulitis is almost always on one side of a body, and the lower limbs are the most common site of involvement.   I have prescribed:  Bactrim DS 1 tablet by mouth BID for 7 days  Approximately 5 minutes was spent documenting and reviewing patient's chart.   HOME CARE:  . Take your medications as ordered and take all of them, even if the skin irritation appears to be healing.   GET HELP RIGHT AWAY IF:  . Symptoms that don't begin to go away within 48 hours. . Severe redness persists or worsens . If the area turns color, spreads or swells. . If it blisters and opens, develops yellow-brown crust or bleeds. . You develop a fever or chills. . If the pain increases or becomes unbearable.  . Are unable to keep fluids and food down.  MAKE SURE YOU    Understand these instructions.  Will watch your condition.  Will get help right away if you are not doing well or get worse.  Thank you for choosing an e-visit. Your e-visit answers were reviewed by a board certified advanced clinical practitioner to complete your personal care plan. Depending upon the condition, your plan could have included both over the counter or prescription medications. Please review your pharmacy choice. Make sure the pharmacy is open so you can pick up prescription now. If there is a problem, you may contact your provider through MyChart messaging and have the prescription routed to another pharmacy. Your safety is important to us. If you have drug allergies check your prescription carefully.  For the  next 24 hours you can use MyChart to ask questions about today's visit, request a non-urgent call back, or ask for a work or school excuse. You will get an email in the next two days asking about your experience. I hope that your e-visit has been valuable and will speed your recovery.  

## 2018-07-15 ENCOUNTER — Encounter: Payer: Self-pay | Admitting: Advanced Practice Midwife

## 2018-07-15 ENCOUNTER — Ambulatory Visit (INDEPENDENT_AMBULATORY_CARE_PROVIDER_SITE_OTHER): Payer: 59 | Admitting: Advanced Practice Midwife

## 2018-07-15 VITALS — Wt 199.0 lb

## 2018-07-15 DIAGNOSIS — Z3480 Encounter for supervision of other normal pregnancy, unspecified trimester: Secondary | ICD-10-CM

## 2018-07-15 DIAGNOSIS — Z3481 Encounter for supervision of other normal pregnancy, first trimester: Secondary | ICD-10-CM

## 2018-07-15 DIAGNOSIS — Z3A13 13 weeks gestation of pregnancy: Secondary | ICD-10-CM

## 2018-07-15 NOTE — Progress Notes (Signed)
   TELEHEALTH OBSTETRICS PRENATAL VIRTUAL VIDEO VISIT ENCOUNTER NOTE  Provider location: [facility]   I connected with Lelon Huh on 07/15/18 at  9:00 AM EDT by WebEx Video Encounter at home and verified that I am speaking with the correct person using two identifiers.   I discussed the limitations, risks, security and privacy concerns of performing an evaluation and management service virtually and the availability of in person appointments. I also discussed with the patient that there may be a patient responsible charge related to this service. The patient expressed understanding and agreed to proceed. Subjective:  Sheila Kirby is a 23 y.o. G3P2002 at [redacted]w[redacted]d being seen today for ongoing prenatal care.  She is currently monitored for the following issues for this low-risk pregnancy and has Family history of spina bifida and Supervision of other normal pregnancy, antepartum on their problem list.  Patient reports no complaints and feels well. No further nausea or vomiting.  Contractions: Not present. Vag. Bleeding: None.  Movement: Absent. Denies any leaking of fluid.   The following portions of the patient's history were reviewed and updated as appropriate: allergies, current medications, past family history, past medical history, past social history, past surgical history and problem list.   Objective:   Vitals:   07/15/18 0829  Weight: 90.3 kg    Fetal Status:     Movement: Absent     General:  Alert, oriented and cooperative. Patient is in no acute distress.  Respiratory: Normal respiratory effort, no problems with respiration noted  Mental Status: Normal mood and affect. Normal behavior. Normal judgment and thought content.  Rest of physical exam deferred due to type of encounter  Imaging: No results found.  Assessment and Plan:  Pregnancy: [id number] at [redacted]w[redacted]d  Reviewed lab results with patient. All within normal limits  .  Preterm labor symptoms and general obstetric precautions including but not limited to vaginal bleeding, contractions, leaking of fluid and fetal movement were reviewed in detail with the patient. I discussed the assessment and treatment plan with the patient. The patient was provided an opportunity to ask questions and all were answered. The patient agreed with the plan and demonstrated an understanding of the instructions. The patient was advised to call back or seek an in-person office evaluation/go to [facility] at [facility] for any urgent or concerning symptoms. Please refer to After Visit Summary for other counseling recommendations.   I provided 10 minutes of face-to-face time during this encounter.    Future Appointments  Date Time Provider [place]  08/18/2018  9:00 AM Nehemiah Settle, Tanna Savoy, DO [facility] None    Hansel Feinstein, [place] for [employer], [place]

## 2018-07-15 NOTE — Progress Notes (Deleted)
  Subjective:    Sheila Kirby is being seen today for her first obstetrical visit.  This {is/is not:9024} a planned pregnancy. She is at [redacted]w[redacted]d gestation. Her obstetrical history is significant for {ob risk factors:10154}. Relationship with FOB: {fob:16621}. Patient {does/does not:19097} intend to breast feed. Pregnancy history fully reviewed.  Patient reports {sx:14538}.  Review of Systems:   Review of Systems  Objective:     Wt 90.3 kg   LMP 04/12/2018   BMI 30.26 kg/m  Physical Exam  Exam    Assessment:    Pregnancy: [id number] Patient Active Problem List   Diagnosis Date Noted  . Supervision of other normal pregnancy, antepartum 06/16/2018  . Family history of spina bifida 11/15/2014       Plan:     Initial labs drawn. Prenatal vitamins. Problem list reviewed and updated. AFP3 discussed: {requests/ordered/declines:14581}. Role of ultrasound in pregnancy discussed; fetal survey: {requests/ordered/declines:14581}. Amniocentesis discussed: {amniocentesis:14582}. Follow up in {numbers 0-4:31231} weeks. ***% of *** min visit spent on counseling and coordination of care.  ***   Hansel Feinstein 07/15/2018

## 2018-07-15 NOTE — Progress Notes (Signed)
Patient agrees to webex. Kathrene Alu RN

## 2018-07-15 NOTE — Patient Instructions (Signed)

## 2018-08-12 ENCOUNTER — Other Ambulatory Visit: Payer: Self-pay

## 2018-08-12 DIAGNOSIS — Z20822 Contact with and (suspected) exposure to covid-19: Secondary | ICD-10-CM

## 2018-08-13 LAB — NOVEL CORONAVIRUS, NAA: SARS-CoV-2, NAA: NOT DETECTED

## 2018-08-18 ENCOUNTER — Encounter: Payer: 59 | Admitting: Family Medicine

## 2018-08-19 ENCOUNTER — Ambulatory Visit: Payer: 59 | Admitting: Family Medicine

## 2018-08-28 ENCOUNTER — Other Ambulatory Visit: Payer: Self-pay

## 2018-08-28 ENCOUNTER — Ambulatory Visit (INDEPENDENT_AMBULATORY_CARE_PROVIDER_SITE_OTHER): Payer: 59 | Admitting: Family Medicine

## 2018-08-28 VITALS — BP 117/69 | HR 89 | Wt 201.0 lb

## 2018-08-28 DIAGNOSIS — Z3480 Encounter for supervision of other normal pregnancy, unspecified trimester: Secondary | ICD-10-CM

## 2018-08-28 DIAGNOSIS — M549 Dorsalgia, unspecified: Secondary | ICD-10-CM | POA: Diagnosis not present

## 2018-08-28 DIAGNOSIS — O9989 Other specified diseases and conditions complicating pregnancy, childbirth and the puerperium: Secondary | ICD-10-CM | POA: Diagnosis not present

## 2018-08-28 DIAGNOSIS — M9905 Segmental and somatic dysfunction of pelvic region: Secondary | ICD-10-CM | POA: Diagnosis not present

## 2018-08-28 DIAGNOSIS — M9903 Segmental and somatic dysfunction of lumbar region: Secondary | ICD-10-CM

## 2018-08-28 DIAGNOSIS — O99891 Other specified diseases and conditions complicating pregnancy: Secondary | ICD-10-CM

## 2018-08-28 DIAGNOSIS — O09899 Supervision of other high risk pregnancies, unspecified trimester: Secondary | ICD-10-CM

## 2018-08-28 DIAGNOSIS — M9904 Segmental and somatic dysfunction of sacral region: Secondary | ICD-10-CM

## 2018-08-28 DIAGNOSIS — O09892 Supervision of other high risk pregnancies, second trimester: Secondary | ICD-10-CM

## 2018-08-28 DIAGNOSIS — Z3A19 19 weeks gestation of pregnancy: Secondary | ICD-10-CM

## 2018-08-28 NOTE — Progress Notes (Signed)
   PRENATAL VISIT NOTE  Subjective:  Sheila Kirby is a 23 y.o. G3P2002 at [redacted]w[redacted]d being seen today for ongoing prenatal care.  She is currently monitored for the following issues for this low-risk pregnancy and has Family history of spina bifida and Supervision of other normal pregnancy, antepartum on their problem list.  Patient reports backache.  Contractions: Not present. Vag. Bleeding: Scant.  Movement: Present. Denies leaking of fluid.   The following portions of the patient's history were reviewed and updated as appropriate: allergies, current medications, past family history, past medical history, past social history, past surgical history and problem list. Problem list updated.  Objective:   Vitals:   08/28/18 1509  BP: 117/69  Pulse: 89  Weight: 201 lb (91.2 kg)    Fetal Status: Fetal Heart Rate (bpm): 155 Fundal Height: 20 cm Movement: Present     General:  Alert, oriented and cooperative. Patient is in no acute distress.  Skin: Skin is warm and dry. No rash noted.   Cardiovascular: Normal heart rate noted  Respiratory: Normal respiratory effort, no problems with respiration noted  Abdomen: Soft, gravid, appropriate for gestational age. Pain/Pressure: Absent     Pelvic:  Cervical exam deferred        MSK: Restriction, tenderness, tissue texture changes, and paraspinal spasm in the lumbar spine  Neuro: Moves all four extremities with no focal neurological deficit  Extremities: Normal range of motion.  Edema: None  Mental Status: Normal mood and affect. Normal behavior. Normal judgment and thought content.   OSE: Head   Cervical   Thoracic   Rib   Lumbar L5 ESRL, L4 ESRR  Sacrum L/L  Pelvis Right ant innom    Assessment and Plan:  Pregnancy: G3P2002 at [redacted]w[redacted]d  1. Supervision of other normal pregnancy, antepartum FHT and FH normal - [place] MFM OB DETAIL +14 WK; Future - AFP TETRA  2. Short interval between pregnancies affecting pregnancy, antepartum - AFP TETRA   3. Back pain affecting pregnancy in second trimester 4. Somatic dysfunction of lumbar region 5. Somatic dysfunction of sacral region 6. Somatic dysfunction of pelvis region OMT done after patient permission. HVLA technique utilized. 3 areas treated with improvement of tissue texture and joint mobility. Patient tolerated procedure well.   Preterm labor symptoms and general obstetric precautions including but not limited to vaginal bleeding, contractions, leaking of fluid and fetal movement were reviewed in detail with the patient. Please refer to After Visit Summary for other counseling recommendations.  Return in about 4 weeks (around 09/25/2018) for OB f/u, Virtual.  Truett Mainland, DO

## 2018-08-30 LAB — AFP TETRA
DIA Mom Value: 1.06
DIA Value (EIA): 162.98 pg/mL
DSR (By Age)    1 IN: 1039
DSR (Second Trimester) 1 IN: 10000
Gestational Age: 19.5 WEEKS
MSAFP Mom: 1.08
MSAFP: 46.8 ng/mL
MSHCG Mom: 0.64
MSHCG: 13338 m[IU]/mL
Maternal Age At EDD: 24.8 yr
Osb Risk: 9540
T18 (By Age): 1:4050 {titer}
Test Results:: NEGATIVE
Weight: 201 [lb_av]
uE3 Mom: 1.13
uE3 Value: 2.01 ng/mL

## 2018-09-16 ENCOUNTER — Other Ambulatory Visit: Payer: Self-pay

## 2018-09-16 ENCOUNTER — Ambulatory Visit (HOSPITAL_COMMUNITY): Payer: 59 | Admitting: *Deleted

## 2018-09-16 ENCOUNTER — Encounter (HOSPITAL_COMMUNITY): Payer: Self-pay

## 2018-09-16 ENCOUNTER — Ambulatory Visit (HOSPITAL_COMMUNITY)
Admission: RE | Admit: 2018-09-16 | Discharge: 2018-09-16 | Disposition: A | Discharge status: Home / Self Care | Payer: 59 | Source: Ambulatory Visit | Attending: Family Medicine | Admitting: Family Medicine

## 2018-09-16 VITALS — BP 117/66 | HR 87 | Temp 98.4°F

## 2018-09-16 DIAGNOSIS — O99212 Obesity complicating pregnancy, second trimester: Secondary | ICD-10-CM

## 2018-09-16 DIAGNOSIS — Z3A22 22 weeks gestation of pregnancy: Secondary | ICD-10-CM | POA: Diagnosis not present

## 2018-09-16 DIAGNOSIS — O0990 Supervision of high risk pregnancy, unspecified, unspecified trimester: Secondary | ICD-10-CM | POA: Insufficient documentation

## 2018-09-16 DIAGNOSIS — Z3480 Encounter for supervision of other normal pregnancy, unspecified trimester: Secondary | ICD-10-CM | POA: Diagnosis not present

## 2018-09-16 DIAGNOSIS — Z8279 Family history of other congenital malformations, deformations and chromosomal abnormalities: Secondary | ICD-10-CM

## 2018-09-24 ENCOUNTER — Telehealth: Payer: Self-pay

## 2018-09-24 NOTE — Telephone Encounter (Signed)
[employer] called with elevated bp reading of 158/79.  Called patient and left message. Kathrene Alu RN

## 2018-09-24 NOTE — Telephone Encounter (Signed)
Patient called back and states bp this morning was 115/70. Patient states she has had headaches before but not taking anything for it.made her aware that she can take tylenol for some headache relief. Patient also states she has been having more frequent nose bleeds- advised to use humidifier especially at night by bedside.   Patient requested that her ob visit next week be in person. Changed per request. Sheila Alu RN

## 2018-09-29 ENCOUNTER — Other Ambulatory Visit: Payer: Self-pay

## 2018-09-29 ENCOUNTER — Encounter: Payer: Self-pay | Admitting: Family Medicine

## 2018-09-29 ENCOUNTER — Ambulatory Visit (INDEPENDENT_AMBULATORY_CARE_PROVIDER_SITE_OTHER): Payer: 59 | Admitting: Family Medicine

## 2018-09-29 VITALS — BP 123/68 | HR 91 | Wt 206.0 lb

## 2018-09-29 DIAGNOSIS — O9989 Other specified diseases and conditions complicating pregnancy, childbirth and the puerperium: Secondary | ICD-10-CM

## 2018-09-29 DIAGNOSIS — M549 Dorsalgia, unspecified: Secondary | ICD-10-CM

## 2018-09-29 DIAGNOSIS — O09899 Supervision of other high risk pregnancies, unspecified trimester: Secondary | ICD-10-CM

## 2018-09-29 DIAGNOSIS — M9903 Segmental and somatic dysfunction of lumbar region: Secondary | ICD-10-CM

## 2018-09-29 DIAGNOSIS — O09892 Supervision of other high risk pregnancies, second trimester: Secondary | ICD-10-CM

## 2018-09-29 DIAGNOSIS — M9904 Segmental and somatic dysfunction of sacral region: Secondary | ICD-10-CM

## 2018-09-29 DIAGNOSIS — Z3480 Encounter for supervision of other normal pregnancy, unspecified trimester: Secondary | ICD-10-CM

## 2018-09-29 DIAGNOSIS — O99891 Other specified diseases and conditions complicating pregnancy: Secondary | ICD-10-CM

## 2018-09-29 DIAGNOSIS — M9905 Segmental and somatic dysfunction of pelvic region: Secondary | ICD-10-CM

## 2018-09-29 DIAGNOSIS — Z3A24 24 weeks gestation of pregnancy: Secondary | ICD-10-CM

## 2018-09-29 NOTE — Progress Notes (Signed)
   PRENATAL VISIT NOTE  Subjective:  Sheila Kirby is a 23 y.o. G3P2002 at [redacted]w[redacted]d being seen today for ongoing prenatal care.  She is currently monitored for the following issues for this low-risk pregnancy and has Family history of spina bifida and Supervision of other normal pregnancy, antepartum on their problem list.  Patient reports backache.  Contractions: Not present. Vag. Bleeding: None.  Movement: Present. Denies leaking of fluid.   The following portions of the patient's history were reviewed and updated as appropriate: allergies, current medications, past family history, past medical history, past social history, past surgical history and problem list. Problem list updated.  Objective:   Vitals:   09/29/18 0925  BP: 123/68  Pulse: 91  Weight: 206 lb (93.4 kg)    Fetal Status: Fetal Heart Rate (bpm): 136 Fundal Height: 26 cm Movement: Present     General:  Alert, oriented and cooperative. Patient is in no acute distress.  Skin: Skin is warm and dry. No rash noted.   Cardiovascular: Normal heart rate noted  Respiratory: Normal respiratory effort, no problems with respiration noted  Abdomen: Soft, gravid, appropriate for gestational age. Pain/Pressure: Present     Pelvic:  Cervical exam deferred        MSK: Restriction, tenderness, tissue texture changes, and paraspinal spasm in the lumbar spine  Neuro: Moves all four extremities with no focal neurological deficit  Extremities: Normal range of motion.  Edema: Trace  Mental Status: Normal mood and affect. Normal behavior. Normal judgment and thought content.   OSE: Head   Cervical   Thoracic   Rib   Lumbar L5 ESRL, L1 ESRR  Sacrum L/L  Pelvis Right ant innom    Assessment and Plan:  Pregnancy: G3P2002 at [redacted]w[redacted]d  1. Supervision of other normal pregnancy, antepartum FHT and FH normal. Will keep close track of fundal height.  2. Short interval between pregnancies affecting pregnancy, antepartum   3. Back pain  affecting pregnancy in second trimester 4. Somatic dysfunction of lumbar region 5. Somatic dysfunction of sacral region 6. Somatic dysfunction of pelvis region OMT done after patient permission. HVLA technique utilized. 3 areas treated with improvement of tissue texture and joint mobility. Patient tolerated procedure well.    Preterm labor symptoms and general obstetric precautions including but not limited to vaginal bleeding, contractions, leaking of fluid and fetal movement were reviewed in detail with the patient. Please refer to After Visit Summary for other counseling recommendations.  Return in about 4 weeks (around 10/27/2018) for OB f/u, 2 hr GTT, In Office.  Truett Mainland, DO

## 2018-10-22 ENCOUNTER — Telehealth: Payer: Self-pay

## 2018-10-22 NOTE — Telephone Encounter (Signed)
Pt called the office stating she has a itchy rash with raised red bumps on the left side of her stomach. Pt states that everything that she has used has not provided any relief. Pt advised to take Benadryl and to use Benadryl lotion. Pt was also given a list of other over the counter creams to use.Advised pt to call the office if rash gets worse. Understanding was voiced.  Zaid Tomes l Jirah Rider, CMA

## 2018-10-30 ENCOUNTER — Ambulatory Visit (INDEPENDENT_AMBULATORY_CARE_PROVIDER_SITE_OTHER): Payer: 59 | Admitting: Family Medicine

## 2018-10-30 ENCOUNTER — Other Ambulatory Visit: Payer: Self-pay

## 2018-10-30 VITALS — BP 122/71 | HR 94 | Wt 210.0 lb

## 2018-10-30 DIAGNOSIS — Z3480 Encounter for supervision of other normal pregnancy, unspecified trimester: Secondary | ICD-10-CM

## 2018-10-30 DIAGNOSIS — O09893 Supervision of other high risk pregnancies, third trimester: Secondary | ICD-10-CM

## 2018-10-30 DIAGNOSIS — O09899 Supervision of other high risk pregnancies, unspecified trimester: Secondary | ICD-10-CM | POA: Insufficient documentation

## 2018-10-30 DIAGNOSIS — Z23 Encounter for immunization: Secondary | ICD-10-CM | POA: Diagnosis not present

## 2018-10-30 DIAGNOSIS — O2686 Pruritic urticarial papules and plaques of pregnancy (PUPPP): Secondary | ICD-10-CM | POA: Insufficient documentation

## 2018-10-30 DIAGNOSIS — Z3A28 28 weeks gestation of pregnancy: Secondary | ICD-10-CM

## 2018-10-30 NOTE — Progress Notes (Signed)
   PRENATAL VISIT NOTE  Subjective:  Sheila Kirby is a 23 y.o. G3P2002 at [redacted]w[redacted]d being seen today for ongoing prenatal care.  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  Patient reports has mildly puritic rash on abdomen. intermittent contractions.  Contractions: Not present. Vag. Bleeding: None.  Movement: Present. Denies leaking of fluid.   The following portions of the patient's history were reviewed and updated as appropriate: allergies, current medications, past family history, past medical history, past social history, past surgical history and problem list.   Objective:   Vitals:   10/30/18 0917  BP: 122/71  Pulse: 94  Weight: 210 lb (95.3 kg)    Fetal Status: Fetal Heart Rate (bpm): 143 Fundal Height: 29 cm Movement: Present     General:  Alert, oriented and cooperative. Patient is in no acute distress.  Skin: Skin is warm and dry. No rash noted.   Cardiovascular: Normal heart rate noted  Respiratory: Normal respiratory effort, no problems with respiration noted  Abdomen: Soft, gravid, appropriate for gestational age.  Pain/Pressure: Present  . PEP on abdomen  Pelvic: Cervical exam deferred        Extremities: Normal range of motion.  Edema: Trace  Mental Status: Normal mood and affect. Normal behavior. Normal judgment and thought content.   Assessment and Plan:  Pregnancy: G3P2002 at [redacted]w[redacted]d 1. Supervision of other normal pregnancy, antepartum FHT and FH normal - Glucose Tolerance, 2 Hours w/1 Hour - CBC - RPR - HIV antibody (with reflex)  2. Short interval between pregnancies affecting pregnancy, antepartum  3. PUPP (pruritic urticarial papules and plaques of pregnancy) Currently using  Aquafor. Discussed possible topical steroid use if necessary.  Preterm labor symptoms and general obstetric precautions including but not limited to vaginal bleeding, contractions, leaking of fluid and fetal movement were reviewed in detail with the patient. Please refer to After Visit Summary for other counseling recommendations.   Return in about 2 weeks (around 11/13/2018).  Future Appointments  Date Time Provider [place]  11/13/2018  9:30 AM Truett Mainland, DO [facility] None    Truett Mainland, DO

## 2018-10-31 ENCOUNTER — Encounter: Payer: 59 | Admitting: Family Medicine

## 2018-10-31 LAB — CBC
Hematocrit: 36.8 % (ref 34.0–46.6)
Hemoglobin: 12.6 g/dL (ref 11.1–15.9)
MCH: 29.9 pg (ref 26.6–33.0)
MCHC: 34.2 g/dL (ref 31.5–35.7)
MCV: 87 fL (ref 79–97)
Platelets: 234 10*3/uL (ref 150–450)
RBC: 4.21 x10E6/uL (ref 3.77–5.28)
RDW: 12.1 % (ref 11.7–15.4)
WBC: 8.4 10*3/uL (ref 3.4–10.8)

## 2018-10-31 LAB — RPR: RPR Ser Ql: NONREACTIVE

## 2018-10-31 LAB — GLUCOSE TOLERANCE, 2 HOURS W/ 1HR
Glucose, 1 hour: 113 mg/dL (ref 65–179)
Glucose, 2 hour: 90 mg/dL (ref 65–152)
Glucose, Fasting: 75 mg/dL (ref 65–91)

## 2018-10-31 LAB — HIV ANTIBODY (ROUTINE TESTING W REFLEX): HIV Screen 4th Generation wRfx: NONREACTIVE

## 2018-11-13 ENCOUNTER — Other Ambulatory Visit: Payer: Self-pay

## 2018-11-13 ENCOUNTER — Ambulatory Visit (INDEPENDENT_AMBULATORY_CARE_PROVIDER_SITE_OTHER): Payer: 59 | Admitting: Family Medicine

## 2018-11-13 VITALS — BP 120/66 | HR 111 | Wt 214.0 lb

## 2018-11-13 DIAGNOSIS — M9908 Segmental and somatic dysfunction of rib cage: Secondary | ICD-10-CM | POA: Diagnosis not present

## 2018-11-13 DIAGNOSIS — O09893 Supervision of other high risk pregnancies, third trimester: Secondary | ICD-10-CM

## 2018-11-13 DIAGNOSIS — M549 Dorsalgia, unspecified: Secondary | ICD-10-CM

## 2018-11-13 DIAGNOSIS — Z3A30 30 weeks gestation of pregnancy: Secondary | ICD-10-CM | POA: Diagnosis not present

## 2018-11-13 DIAGNOSIS — M9902 Segmental and somatic dysfunction of thoracic region: Secondary | ICD-10-CM

## 2018-11-13 DIAGNOSIS — M9904 Segmental and somatic dysfunction of sacral region: Secondary | ICD-10-CM | POA: Diagnosis not present

## 2018-11-13 DIAGNOSIS — O09899 Supervision of other high risk pregnancies, unspecified trimester: Secondary | ICD-10-CM

## 2018-11-13 DIAGNOSIS — Z3480 Encounter for supervision of other normal pregnancy, unspecified trimester: Secondary | ICD-10-CM

## 2018-11-13 DIAGNOSIS — M9905 Segmental and somatic dysfunction of pelvic region: Secondary | ICD-10-CM | POA: Diagnosis not present

## 2018-11-13 DIAGNOSIS — M9903 Segmental and somatic dysfunction of lumbar region: Secondary | ICD-10-CM | POA: Diagnosis not present

## 2018-11-13 DIAGNOSIS — O99891 Other specified diseases and conditions complicating pregnancy: Secondary | ICD-10-CM

## 2018-11-13 NOTE — Progress Notes (Signed)
   PRENATAL VISIT NOTE  Subjective:  Sheila Kirby is a 23 y.o. G3P2002 at [redacted]w[redacted]d being seen today for ongoing prenatal care.  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  Patient reports backache. Left sided pain, started two weeks ago. Radiates down leg and up spine. Worse after sitting on floor.  Contractions: Not present. Vag. Bleeding: None.  Movement: Present. Denies leaking of fluid.   The following portions of the patient's history were reviewed and updated as appropriate: allergies, current medications, past family history, past medical history, past social history, past surgical history and problem list. Problem list updated.  Objective:   Vitals:   11/13/18 0936 11/13/18 0940  BP: (!) 147/123 120/66  Pulse: (!) 121 (!) 111  Weight: 214 lb (97.1 kg)     Fetal Status: Fetal Heart Rate (bpm): 130   Movement: Present     General:  Alert, oriented and cooperative. Patient is in no acute distress.  Skin: Skin is warm and dry. No rash noted.   Cardiovascular: Normal heart rate noted  Respiratory: Normal respiratory effort, no problems with respiration noted  Abdomen: Soft, gravid, appropriate for gestational age. Pain/Pressure: Present     Pelvic:  Cervical exam deferred        MSK: Restriction, tenderness, tissue texture changes, and paraspinal spasm in the lumbar spine  Neuro: Moves all four extremities with no focal neurological deficit  Extremities: Normal range of motion.  Edema: Trace  Mental Status: Normal mood and affect. Normal behavior. Normal judgment and thought content.   OSE: Head   Cervical   Thoracic T4 FSRL, T8 FSRR  Rib T4 inhaled  Lumbar L5 ESRL  Sacrum L/L  Pelvis Right ant  innom    Assessment and Plan:  Pregnancy: G3P2002 at [redacted]w[redacted]d  1. Supervision of other normal pregnancy, antepartum FHT and FH normal  2. Short interval between pregnancies affecting pregnancy, antepartum  3. Back pain affecting pregnancy in second trimester 4. Somatic dysfunction of lumbar region 5. Somatic dysfunction of sacral region 6. Somatic dysfunction of pelvis region 7. Somatic dysfunction of spine, thoracic 8. Somatic dysfunction of rib Stretches taught. OMT done after patient permission. HVLA technique utilized. 5 areas treated with improvement of tissue texture and joint mobility. Patient tolerated procedure well.   Preterm labor symptoms and general obstetric precautions including but not limited to vaginal bleeding, contractions, leaking of fluid and fetal movement were reviewed in detail with the patient. Please refer to After Visit Summary for other counseling recommendations.  Return in about 2 weeks (around 11/27/2018) for OB f/u.  Truett Mainland, DO

## 2018-11-24 ENCOUNTER — Inpatient Hospital Stay (HOSPITAL_COMMUNITY)
Admission: AD | Admit: 2018-11-24 | Discharge: 2018-11-24 | Disposition: A | Discharge status: Home / Self Care | Payer: 59 | Attending: Obstetrics and Gynecology | Admitting: Obstetrics and Gynecology

## 2018-11-24 ENCOUNTER — Telehealth: Payer: Self-pay

## 2018-11-24 ENCOUNTER — Encounter (HOSPITAL_COMMUNITY): Payer: Self-pay

## 2018-11-24 ENCOUNTER — Inpatient Hospital Stay (HOSPITAL_COMMUNITY): Payer: 59

## 2018-11-24 ENCOUNTER — Other Ambulatory Visit: Payer: Self-pay

## 2018-11-24 DIAGNOSIS — Z20822 Contact with and (suspected) exposure to covid-19: Secondary | ICD-10-CM

## 2018-11-24 DIAGNOSIS — R0600 Dyspnea, unspecified: Secondary | ICD-10-CM | POA: Diagnosis not present

## 2018-11-24 DIAGNOSIS — Z20828 Contact with and (suspected) exposure to other viral communicable diseases: Secondary | ICD-10-CM | POA: Diagnosis not present

## 2018-11-24 DIAGNOSIS — O26893 Other specified pregnancy related conditions, third trimester: Secondary | ICD-10-CM | POA: Insufficient documentation

## 2018-11-24 DIAGNOSIS — Z808 Family history of malignant neoplasm of other organs or systems: Secondary | ICD-10-CM | POA: Diagnosis not present

## 2018-11-24 DIAGNOSIS — R42 Dizziness and giddiness: Secondary | ICD-10-CM | POA: Insufficient documentation

## 2018-11-24 DIAGNOSIS — Z833 Family history of diabetes mellitus: Secondary | ICD-10-CM | POA: Insufficient documentation

## 2018-11-24 DIAGNOSIS — I951 Orthostatic hypotension: Secondary | ICD-10-CM

## 2018-11-24 DIAGNOSIS — O163 Unspecified maternal hypertension, third trimester: Secondary | ICD-10-CM

## 2018-11-24 DIAGNOSIS — R0602 Shortness of breath: Secondary | ICD-10-CM | POA: Insufficient documentation

## 2018-11-24 DIAGNOSIS — O2653 Maternal hypotension syndrome, third trimester: Secondary | ICD-10-CM | POA: Insufficient documentation

## 2018-11-24 DIAGNOSIS — Z03818 Encounter for observation for suspected exposure to other biological agents ruled out: Secondary | ICD-10-CM

## 2018-11-24 DIAGNOSIS — Z8249 Family history of ischemic heart disease and other diseases of the circulatory system: Secondary | ICD-10-CM | POA: Insufficient documentation

## 2018-11-24 DIAGNOSIS — Z3A32 32 weeks gestation of pregnancy: Secondary | ICD-10-CM | POA: Insufficient documentation

## 2018-11-24 LAB — COMPREHENSIVE METABOLIC PANEL
ALT: 17 U/L (ref 0–44)
AST: 14 U/L — ABNORMAL LOW (ref 15–41)
Albumin: 3 g/dL — ABNORMAL LOW (ref 3.5–5.0)
Alkaline Phosphatase: 135 U/L — ABNORMAL HIGH (ref 38–126)
Anion gap: 7 (ref 5–15)
BUN: 5 mg/dL — ABNORMAL LOW (ref 6–20)
CO2: 24 mmol/L (ref 22–32)
Calcium: 8.7 mg/dL — ABNORMAL LOW (ref 8.9–10.3)
Chloride: 106 mmol/L (ref 98–111)
Creatinine, Ser: 0.57 mg/dL (ref 0.44–1.00)
GFR calc Af Amer: >60 mL/min (ref >60)
GFR calc non Af Amer: >60 mL/min (ref >60)
Glucose, Bld: 90 mg/dL (ref 70–99)
Potassium: 3.8 mmol/L (ref 3.5–5.1)
Sodium: 137 mmol/L (ref 135–145)
Total Bilirubin: 0.3 mg/dL (ref 0.3–1.2)
Total Protein: 6.3 g/dL — ABNORMAL LOW (ref 6.5–8.1)

## 2018-11-24 LAB — CBC
HCT: 36.5 % (ref 36.0–46.0)
Hemoglobin: 12.4 g/dL (ref 12.0–15.0)
MCH: 30.1 pg (ref 26.0–34.0)
MCHC: 34 g/dL (ref 30.0–36.0)
MCV: 88.6 fL (ref 80.0–100.0)
Platelets: 204 10*3/uL (ref 150–400)
RBC: 4.12 MIL/uL (ref 3.87–5.11)
RDW: 12.3 % (ref 11.5–15.5)
WBC: 8.9 10*3/uL (ref 4.0–10.5)
nRBC: 0 % (ref 0.0–0.2)

## 2018-11-24 LAB — URINALYSIS, ROUTINE W REFLEX MICROSCOPIC
Bilirubin Urine: NEGATIVE
Glucose, UA: NEGATIVE mg/dL
Hgb urine dipstick: NEGATIVE
Ketones, ur: NEGATIVE mg/dL
Leukocytes,Ua: NEGATIVE
Nitrite: NEGATIVE
Protein, ur: NEGATIVE mg/dL
Specific Gravity, Urine: 1.004 — ABNORMAL LOW (ref 1.005–1.030)
pH: 7 (ref 5.0–8.0)

## 2018-11-24 LAB — SARS CORONAVIRUS 2 (TAT 6-24 HRS): SARS Coronavirus 2: NEGATIVE

## 2018-11-24 MED ORDER — IOHEXOL 350 MG/ML SOLN
75.0000 mL | Freq: Once | INTRAVENOUS | Status: AC | PRN
  Administered 2018-11-24: 75 mL via INTRAVENOUS

## 2018-11-24 NOTE — MAU Provider Note (Signed)
History     CSN: [id number]  Arrival date and time: 11/24/18 1049   First Provider Initiated Contact with Patient 11/24/18 1133      Chief Complaint  Patient presents with  . Shortness of Breath  . Dizziness   HPI   Ms.Sheila Kirby is a 23 y.o. female G54P2002 @ [redacted]w[redacted]d here in MAU with complaints of SOB. Says she was sitting at work and became short of breath. Says she had been sitting for one hour when she became short of breath. States 1 month ago she had a dizziness episode of lower BP (110/60) but other than that she has not had shortness of breath. At times she feels like she cannot catch her breath. At times she feels like her heart is racing fasting than normal. For breakfast this morning she ate a chicken biscuit and did eat the whole thing. She ate that about 45 minutes prior to the symptoms coming.   No sick contacts.    OB History    Gravida  3   Para  2   Term  2   Preterm      AB      Living  2     SAB      TAB      Ectopic      Multiple  0   Live Births  2           Past Medical History:  Diagnosis Date  . Medical history non-contributory     Past Surgical History:  Procedure Laterality Date  . NO PAST SURGERIES    . WISDOM TOOTH EXTRACTION      Family History  Problem Relation Age of Onset  . Hypertension Mother   . Diabetes Father   . Hypertension Father   . Cancer Sister        skin cancer  . Cancer Maternal Grandfather        skin  . Cancer Paternal Grandfather        renal  . Spina bifida Paternal Uncle   . Stroke Neg Hx   . Miscarriages / Stillbirths Neg Hx   . Mental retardation Neg Hx     Social History   Tobacco Use  . Smoking status: Never Smoker  . Smokeless tobacco: Never Used  Substance Use Topics  . Alcohol use: No  . Drug use: No    Allergies: No Known Allergies  No medications prior to admission.   Results for orders placed or performed during the hospital encounter of 11/24/18 (from the  past 48 hour(s))  Urinalysis, Routine w reflex microscopic     Status: Abnormal   Collection Time: 11/24/18 11:45 AM  Result Value Ref Range   Color, Urine STRAW (A) YELLOW   APPearance CLEAR CLEAR   Specific Gravity, Urine 1.004 (L) 1.005 - 1.030   pH 7.0 5.0 - 8.0   Glucose, UA NEGATIVE NEGATIVE mg/dL   Hgb urine dipstick NEGATIVE NEGATIVE   Bilirubin Urine NEGATIVE NEGATIVE   Ketones, ur NEGATIVE NEGATIVE mg/dL   Protein, ur NEGATIVE NEGATIVE mg/dL   Nitrite NEGATIVE NEGATIVE   Leukocytes,Ua NEGATIVE NEGATIVE    Comment: Performed at [facility], [address], [place], [place] [address]  SARS CORONAVIRUS 2 (TAT 6-24 HRS) Nasopharyngeal Nasopharyngeal Swab     Status: None   Collection Time: 11/24/18  1:00 PM   Specimen: Nasopharyngeal Swab  Result Value Ref Range   SARS Coronavirus 2 NEGATIVE NEGATIVE  Comment: (NOTE) SARS-CoV-2 target nucleic acids are NOT DETECTED. The SARS-CoV-2 RNA is generally detectable in upper and lower respiratory specimens during the acute phase of infection. Negative results do not preclude SARS-CoV-2 infection, do not rule out co-infections with other pathogens, and should not be used as the sole basis for treatment or other patient management decisions. Negative results must be combined with clinical observations, patient history, and epidemiological information. The expected result is Negative. Fact Sheet for Patients: SugarRoll.be Fact Sheet for Healthcare Providers: https://www.woods-mathews.com/ This test is not yet approved or cleared by the [place] FDA and  has been authorized for detection and/or diagnosis of SARS-CoV-2 by FDA under an Emergency Use Authorization (EUA). This EUA will remain  in effect (meaning this test can be used) for the duration of the COVID-19 declaration under Section 56 4(b)(1) of the Act, 21 U.S.C. section 360bbb-3(b)(1), unless the authorization is  terminated or revoked sooner. Performed at [facility], [place] [address], [place], [place] [address]   CBC     Status: None   Collection Time: 11/24/18  2:18 PM  Result Value Ref Range   WBC 8.9 4.0 - 10.5 K/uL   RBC 4.12 3.87 - 5.11 MIL/uL   Hemoglobin 12.4 12.0 - 15.0 g/dL   HCT 36.5 36.0 - 46.0 %   MCV 88.6 80.0 - 100.0 fL   MCH 30.1 26.0 - 34.0 pg   MCHC 34.0 30.0 - 36.0 g/dL   RDW 12.3 11.5 - 15.5 %   Platelets 204 150 - 400 K/uL   nRBC 0.0 0.0 - 0.2 %    Comment: Performed at [facility], [place] [address], [place],  [address]  Comprehensive metabolic panel     Status: Abnormal   Collection Time: 11/24/18  2:18 PM  Result Value Ref Range   Sodium 137 135 - 145 mmol/L   Potassium 3.8 3.5 - 5.1 mmol/L   Chloride 106 98 - 111 mmol/L   CO2 24 22 - 32 mmol/L   Glucose, Bld 90 70 - 99 mg/dL   BUN 5 (L) 6 - 20 mg/dL   Creatinine, Ser 0.57 0.44 - 1.00 mg/dL   Calcium 8.7 (L) 8.9 - 10.3 mg/dL   Total Protein 6.3 (L) 6.5 - 8.1 g/dL   Albumin 3.0 (L) 3.5 - 5.0 g/dL   AST 14 (L) 15 - 41 U/L   ALT 17 0 - 44 U/L   Alkaline Phosphatase 135 (H) 38 - 126 U/L   Total Bilirubin 0.3 0.3 - 1.2 mg/dL   GFR calc non Af Amer >60 >60 mL/min   GFR calc Af Amer >60 >60 mL/min   Anion gap 7 5 - 15    Comment: Performed at [facility], [place] [address], [place], [place] [address]   Ct Angio Chest Pe W Or Wo Contrast  Result Date: 11/24/2018 CLINICAL DATA:  23 year old pregnant female at [redacted] weeks gestational age, presenting with dyspnea and dizziness. EXAM: CT ANGIOGRAPHY CHEST WITH CONTRAST TECHNIQUE: Multidetector CT imaging of the chest was performed using the standard protocol during bolus administration of intravenous contrast. Multiplanar CT image reconstructions and MIPs were obtained to evaluate the vascular anatomy. CONTRAST:  25mL OMNIPAQUE IOHEXOL 350 MG/ML SOLN COMPARISON:  Chest radiograph from earlier today. FINDINGS: Cardiovascular: The study is moderate to  high quality for the evaluation of pulmonary embolism, with some motion degradation. There are no filling defects in the central, lobar, segmental or subsegmental pulmonary artery branches to  suggest acute pulmonary embolism. Great vessels are normal in course and caliber. Top-normal heart size. No significant pericardial fluid/thickening. Mediastinum/Nodes: No discrete thyroid nodules. Unremarkable esophagus. No pathologically enlarged axillary, mediastinal or hilar lymph nodes. Lungs/Pleura: No pneumothorax. No pleural effusion. No acute consolidative airspace disease, lung masses or significant pulmonary nodules. Upper abdomen: No acute abnormality. Musculoskeletal:  No aggressive appearing focal osseous lesions. Review of the MIP images confirms the above findings. IMPRESSION: Normal chest CT.  No pulmonary embolism. Electronically Signed   By: Delbert PhenixJason A Poff M.D.   On: 11/24/2018 17:58   Dg Chest Port 1 View  Result Date: 11/24/2018 CLINICAL DATA:  Shortness of breath. EXAM: PORTABLE CHEST 1 VIEW COMPARISON:  None. FINDINGS: The heart size and mediastinal contours are within normal limits. Both lungs are clear. No pneumothorax or pleural effusion is noted. The visualized skeletal structures are unremarkable. IMPRESSION: No active disease. Electronically Signed   By: Lupita RaiderJames  Green Jr M.D.   On: 11/24/2018 14:05   Review of Systems  Constitutional: Negative for fatigue and fever.  Respiratory: Positive for shortness of breath. Negative for cough.   Gastrointestinal: Positive for abdominal pain (Occasional contractions).  Genitourinary: Negative for vaginal bleeding and vaginal discharge.  Neurological: Positive for dizziness. Negative for light-headedness and headaches.   Physical Exam   Blood pressure 137/65, pulse (!) 116, temperature 98.6 F (37 C), temperature source Oral, resp. rate 19, height 5\' 8"  (1.727 m), weight 99.6 kg, last menstrual period 04/12/2018, SpO2 97 %, currently  breastfeeding.  Physical Exam  Constitutional: She is oriented to person, place, and time. She appears well-developed and well-nourished. No distress.  HENT:  Head: Normocephalic.  Eyes: Pupils are equal, round, and reactive to light.  Cardiovascular: Normal rate.  Respiratory: Effort normal and breath sounds normal. No respiratory distress. She has no wheezes. She has no rales. She exhibits no tenderness.  GI: Soft. She exhibits no distension. There is no abdominal tenderness. There is no rebound and no guarding.  Genitourinary:    Genitourinary Comments: Dilation: Closed Effacement (%): Thick Cervical Position: Posterior Exam by:: Venia CarbonJennifer Yalonda Sample, NP   Neurological: She is alert and oriented to person, place, and time.  Skin: Skin is warm. She is not diaphoretic.  Psychiatric: Her behavior is normal.   Fetal Tracing: Baseline: 125 bpm Variability: Moderate  Accelerations: 15x15 Decelerations: None Toco: UI  MAU Course  Procedures  None  MDM  Orthostatic vitals + Oral hydration while in MAU EKG: normal sinus rhythm.  Chest xray- normal.  CT- Negative for PE Patient up to the bathroom w/o symptoms Cervix closed.   Assessment and Plan   A:  1. [redacted] weeks gestation of pregnancy   2. Shortness of breath   3. Orthostatic hypotension   4. Lab test negative for COVID-19 virus   5. Dizziness      P:  Discharge home in stable condition Increase oral fluid intake  Change positions slowly  Return to MAU if symptoms worsen  Call 911 or go to ED if SOB worsens Reassurance offered Work note provided.  She may need to see cardiology for cardiac monitor. She is aware of this. Keep your appointment for next week.    Duane Lopeasch, Danah Reinecke I, NP 11/24/2018 8:39 PM

## 2018-11-24 NOTE — Discharge Instructions (Signed)

## 2018-11-24 NOTE — Telephone Encounter (Signed)
Patient called stating that she has been at work for about an hour and not really doing much but is feeling like she is out of it and feeling like she could faint. Patient states she has been hydrating well and has not been sick. Patient is [redacted] weeks pregnant. Patient instructed to have someone take her to MAU now for evaluation. Patient states understanding and agrees with plan. Kathrene Alu RN

## 2018-11-24 NOTE — MAU Note (Signed)
Pt was at work and had been sitting for about 45 mins. She began to feel short of breath and lightheaded. No other symptoms.  Denies any sick contacts. Has had PO fluids and ate breakfast. This the first time she's felt this way at rest.  Denies cntrx, VB, LOF. +FM.  Has hx of low BP. 1st BP was 130/80

## 2018-12-05 ENCOUNTER — Ambulatory Visit (INDEPENDENT_AMBULATORY_CARE_PROVIDER_SITE_OTHER): Payer: 59 | Admitting: Family Medicine

## 2018-12-05 ENCOUNTER — Other Ambulatory Visit: Payer: Self-pay

## 2018-12-05 VITALS — BP 118/88 | HR 129 | Wt 213.0 lb

## 2018-12-05 DIAGNOSIS — Z8279 Family history of other congenital malformations, deformations and chromosomal abnormalities: Secondary | ICD-10-CM

## 2018-12-05 DIAGNOSIS — Z3A33 33 weeks gestation of pregnancy: Secondary | ICD-10-CM

## 2018-12-05 DIAGNOSIS — Z3480 Encounter for supervision of other normal pregnancy, unspecified trimester: Secondary | ICD-10-CM

## 2018-12-05 DIAGNOSIS — O09893 Supervision of other high risk pregnancies, third trimester: Secondary | ICD-10-CM

## 2018-12-05 DIAGNOSIS — O09899 Supervision of other high risk pregnancies, unspecified trimester: Secondary | ICD-10-CM

## 2018-12-05 NOTE — Progress Notes (Signed)
   PRENATAL VISIT NOTE  Subjective:  Sheila Kirby is a 23 y.o. G3P2002 at [redacted]w[redacted]d being seen today for ongoing prenatal care.  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  Patient reports no complaints.  Contractions: Not present.  .  Movement: Present. Denies leaking of fluid.   The following portions of the patient's history were reviewed and updated as appropriate: allergies, current medications, past family history, past medical history, past social history, past surgical history and problem list.   Objective:   Vitals:   12/05/18 1007  BP: 118/88  Pulse: (!) 129  Weight: 213 lb (96.6 kg)    Fetal Status: Fetal Heart Rate (bpm): 145   Movement: Present     General:  Alert, oriented and cooperative. Patient is in no acute distress.  Skin: Skin is warm and dry. No rash noted.   Cardiovascular: Normal heart rate noted  Respiratory: Normal respiratory effort, no problems with respiration noted  Abdomen: Soft, gravid, appropriate for gestational age.  Pain/Pressure: Present     Pelvic: Cervical exam deferred        Extremities: Normal range of motion.  Edema: Trace  Mental Status: Normal mood and affect. Normal behavior. Normal judgment and thought content.   Assessment and Plan:  Pregnancy: G3P2002 at [redacted]w[redacted]d  1. Supervision of other normal pregnancy, antepartum FHT and FH normal  2. Short interval between pregnancies affecting pregnancy, antepartum   3. Family history of spina bifida Low risk quad screen  Preterm labor symptoms and general obstetric precautions including but not limited to vaginal bleeding, contractions, leaking of fluid and fetal movement were reviewed in detail with the  patient. Please refer to After Visit Summary for other counseling recommendations.   Return in about 2 weeks (around 12/19/2018) for OB f/u.  Future Appointments  Date Time Provider [place]  12/22/2018  8:15 AM Truett Mainland, DO [facility] None    Truett Mainland, DO

## 2018-12-11 DIAGNOSIS — R42 Dizziness and giddiness: Secondary | ICD-10-CM

## 2018-12-11 DIAGNOSIS — R55 Syncope and collapse: Secondary | ICD-10-CM

## 2018-12-12 ENCOUNTER — Ambulatory Visit (INDEPENDENT_AMBULATORY_CARE_PROVIDER_SITE_OTHER): Payer: 59 | Admitting: Obstetrics & Gynecology

## 2018-12-12 ENCOUNTER — Other Ambulatory Visit: Payer: Self-pay

## 2018-12-12 ENCOUNTER — Other Ambulatory Visit: Payer: 59

## 2018-12-12 ENCOUNTER — Encounter: Payer: Self-pay | Admitting: Obstetrics & Gynecology

## 2018-12-12 VITALS — BP 129/76 | HR 108 | Wt 215.0 lb

## 2018-12-12 DIAGNOSIS — R42 Dizziness and giddiness: Secondary | ICD-10-CM

## 2018-12-12 DIAGNOSIS — Z3480 Encounter for supervision of other normal pregnancy, unspecified trimester: Secondary | ICD-10-CM | POA: Diagnosis not present

## 2018-12-12 DIAGNOSIS — O368390 Maternal care for abnormalities of the fetal heart rate or rhythm, unspecified trimester, not applicable or unspecified: Secondary | ICD-10-CM | POA: Diagnosis not present

## 2018-12-12 DIAGNOSIS — O368331 Maternal care for abnormalities of the fetal heart rate or rhythm, third trimester, fetus 1: Secondary | ICD-10-CM

## 2018-12-12 DIAGNOSIS — Z3A34 34 weeks gestation of pregnancy: Secondary | ICD-10-CM

## 2018-12-12 DIAGNOSIS — O09893 Supervision of other high risk pregnancies, third trimester: Secondary | ICD-10-CM

## 2018-12-12 DIAGNOSIS — R55 Syncope and collapse: Secondary | ICD-10-CM

## 2018-12-12 DIAGNOSIS — O09899 Supervision of other high risk pregnancies, unspecified trimester: Secondary | ICD-10-CM

## 2018-12-12 NOTE — Progress Notes (Signed)
   PRENATAL VISIT NOTE  Subjective:  Sheila Kirby is a 23 y.o. G3P2002 at [redacted]w[redacted]d being seen today for ongoing prenatal care.  She is currently monitored for the following issues for this low-risk pregnancy and has Family history of spina bifida; Supervision of other normal pregnancy, antepartum; and Short interval between pregnancies affecting pregnancy, antepartum on their problem list.  Patient reports Pt reports feeling lightheaded and has had to pull over in car to see.    She does not feel her heart racing. She has a family history of thyroid disease. She was referred to cardiology by Dr. Nehemiah Settle but, wanted to be seen here first to see if this was something that could be handled without a referral. Contractions: Not present. Vag. Bleeding: None.  Movement: Present. Denies leaking of fluid.   The following portions of the patient's history were reviewed and updated as appropriate: allergies, current medications, past family history, past medical history, past social history, past surgical history and problem list.   Objective:   Vitals:   12/12/18 1012  BP: 129/76  Pulse: (!) 108  Weight: 215 lb (97.5 kg)    Fetal Status: Fetal Heart Rate (bpm): 145   Movement: Present     General:  Alert, oriented and cooperative. Patient is in no acute distress.  Skin: Skin is warm and dry. No rash noted.   Cardiovascular: Normal heart rate noted  Respiratory: Normal respiratory effort, no problems with respiration noted  Abdomen: Soft, gravid, appropriate for gestational age.  Pain/Pressure: Present     Pelvic: Cervical exam deferred        Extremities: Normal range of motion.  Edema: Trace  Mental Status: Normal mood and affect. Normal behavior. Normal judgment and thought content.   Assessment and Plan:  Pregnancy: G3P2002 at [redacted]w[redacted]d 1. Supervision of other normal pregnancy, antepartum Good FM   2. Short interval between pregnancies affecting pregnancy, antepartum  3. Symptomatic  maternal tachycardia rec labs to r/o hyperthyroidism, electrolyte imbalance or anemia.  TSH CBC BMP  If sx persist and labs are normal, rec a Holter monitor to eval for rhythm disturbances through cardiology.   Preterm labor symptoms and general obstetric precautions including but not limited to vaginal bleeding, contractions, leaking of fluid and fetal movement were reviewed in detail with the patient. Please refer to After Visit Summary for other counseling recommendations.   Return in about 2 weeks (around 12/26/2018).  Future Appointments  Date Time Provider Department Center  12/22/2018  8:15 AM Truett Mainland, DO [facility] None    Lavonia Drafts, MD

## 2018-12-12 NOTE — Addendum Note (Signed)
Addended by: Phill Myron on: 12/12/2018 11:00 AM   Modules accepted: Orders

## 2018-12-12 NOTE — Progress Notes (Signed)
Patient sent to lab for bloodwork ordered by Dr. Nehemiah Settle. Kathrene Alu RN

## 2018-12-13 LAB — BASIC METABOLIC PANEL
BUN/Creatinine Ratio: 10 (ref 9–23)
BUN: 6 mg/dL (ref 6–20)
CO2: 21 mmol/L (ref 20–29)
Calcium: 8.9 mg/dL (ref 8.7–10.2)
Chloride: 103 mmol/L (ref 96–106)
Creatinine, Ser: 0.58 mg/dL (ref 0.57–1.00)
GFR calc Af Amer: 149 mL/min/{1.73_m2} (ref >59)
GFR calc non Af Amer: 129 mL/min/{1.73_m2} (ref >59)
Glucose: 83 mg/dL (ref 65–99)
Potassium: 4.3 mmol/L (ref 3.5–5.2)
Sodium: 137 mmol/L (ref 134–144)

## 2018-12-13 LAB — COMPREHENSIVE METABOLIC PANEL
ALT: 18 IU/L (ref 0–32)
AST: 14 IU/L (ref 0–40)
Albumin/Globulin Ratio: 1.4 (ref 1.2–2.2)
Albumin: 3.5 g/dL — ABNORMAL LOW (ref 3.9–5.0)
Alkaline Phosphatase: 179 IU/L — ABNORMAL HIGH (ref 39–117)
BUN/Creatinine Ratio: 10 (ref 9–23)
BUN: 6 mg/dL (ref 6–20)
Bilirubin Total: 0.3 mg/dL (ref 0.0–1.2)
CO2: 21 mmol/L (ref 20–29)
Calcium: 8.8 mg/dL (ref 8.7–10.2)
Chloride: 102 mmol/L (ref 96–106)
Creatinine, Ser: 0.61 mg/dL (ref 0.57–1.00)
GFR calc Af Amer: 147 mL/min/{1.73_m2} (ref >59)
GFR calc non Af Amer: 127 mL/min/{1.73_m2} (ref >59)
Globulin, Total: 2.5 g/dL (ref 1.5–4.5)
Glucose: 84 mg/dL (ref 65–99)
Potassium: 4.3 mmol/L (ref 3.5–5.2)
Sodium: 136 mmol/L (ref 134–144)
Total Protein: 6 g/dL (ref 6.0–8.5)

## 2018-12-13 LAB — CBC
Hematocrit: 34.6 % (ref 34.0–46.6)
Hemoglobin: 11.6 g/dL (ref 11.1–15.9)
MCH: 28.9 pg (ref 26.6–33.0)
MCHC: 33.5 g/dL (ref 31.5–35.7)
MCV: 86 fL (ref 79–97)
Platelets: 189 10*3/uL (ref 150–450)
RBC: 4.02 x10E6/uL (ref 3.77–5.28)
RDW: 12.1 % (ref 11.7–15.4)
WBC: 7 10*3/uL (ref 3.4–10.8)

## 2018-12-13 LAB — TSH: TSH: 1.23 u[IU]/mL (ref 0.450–4.500)

## 2018-12-13 LAB — MAGNESIUM: Magnesium: 1.9 mg/dL (ref 1.6–2.3)

## 2018-12-17 ENCOUNTER — Ambulatory Visit (INDEPENDENT_AMBULATORY_CARE_PROVIDER_SITE_OTHER): Payer: 59

## 2018-12-17 ENCOUNTER — Ambulatory Visit (INDEPENDENT_AMBULATORY_CARE_PROVIDER_SITE_OTHER): Payer: 59 | Admitting: Cardiology

## 2018-12-17 ENCOUNTER — Encounter: Payer: Self-pay | Admitting: Cardiology

## 2018-12-17 ENCOUNTER — Other Ambulatory Visit: Payer: Self-pay

## 2018-12-17 DIAGNOSIS — R011 Cardiac murmur, unspecified: Secondary | ICD-10-CM | POA: Insufficient documentation

## 2018-12-17 DIAGNOSIS — R002 Palpitations: Secondary | ICD-10-CM | POA: Diagnosis not present

## 2018-12-17 HISTORY — DX: Cardiac murmur, unspecified: R01.1

## 2018-12-17 NOTE — Patient Instructions (Signed)
Medication Instructions:  Your physician recommends that you continue on your current medications as directed. Please refer to the Current Medication list given to you today.  *If you need a refill on your cardiac medications before your next appointment, please call your pharmacy*  Lab Work: None Ordered    Testing/Procedures: Your physician has requested that you have an echocardiogram. Echocardiography is a painless test that uses sound waves to create images of your heart. It provides your doctor with information about the size and shape of your heart and how well your heart's chambers and valves are working. This procedure takes approximately one hour. There are no restrictions for this procedure.  Your physician has recommended that you wear an event monitor. Event monitors are medical devices that record the heart's electrical activity. Doctors most often [place] these monitors to diagnose arrhythmias. Arrhythmias are problems with the speed or rhythm of the heartbeat. The monitor is a small, portable device. You can wear one while you do your normal daily activities. This is usually used to diagnose what is causing palpitations/syncope (passing out).    Follow-Up: At [facility], you and your health needs are our priority.  As part of our continuing mission to provide you with exceptional heart care, we have created designated Provider Care Teams.  These Care Teams include your primary Cardiologist (physician) and Advanced Practice Providers (APPs -  Physician Assistants and Nurse Practitioners) who all work together to provide you with the care you need, when you need it.  Your next appointment:   3 month(s)  The format for your next appointment:   In Person  Provider:   Belva Crome, MD  Other Instructions   Echocardiogram An echocardiogram is a procedure that uses painless sound waves (ultrasound) to produce an image of the heart. Images from an echocardiogram can provide  important information about:  Signs of coronary artery disease (CAD).  Aneurysm detection. An aneurysm is a weak or damaged part of an artery wall that bulges out from the normal force of blood pumping through the body.  Heart size and shape. Changes in the size or shape of the heart can be associated with certain conditions, including heart failure, aneurysm, and CAD.  Heart muscle function.  Heart valve function.  Signs of a past heart attack.  Fluid buildup around the heart.  Thickening of the heart muscle.  A tumor or infectious growth around the heart valves. Tell a health care provider about:  Any allergies you have.  All medicines you are taking, including vitamins, herbs, eye drops, creams, and over-the-counter medicines.  Any blood disorders you have.  Any surgeries you have had.  Any medical conditions you have.  Whether you are pregnant or may be pregnant. What are the risks? Generally, this is a safe procedure. However, problems may occur, including:  Allergic reaction to dye (contrast) that may be used during the procedure. What happens before the procedure? No specific preparation is needed. You may eat and drink normally. What happens during the procedure?   An IV tube may be inserted into one of your veins.  You may receive contrast through this tube. A contrast is an injection that improves the quality of the pictures from your heart.  A gel will be applied to your chest.  A wand-like tool (transducer) will be moved over your chest. The gel will help to transmit the sound waves from the transducer.  The sound waves will harmlessly bounce off of your heart to allow  the heart images to be captured in real-time motion. The images will be recorded on a computer. The procedure may vary among health care providers and hospitals. What happens after the procedure?  You may return to your normal, everyday life, including diet, activities, and medicines,  unless your health care provider tells you not to do that. Summary  An echocardiogram is a procedure that uses painless sound waves (ultrasound) to produce an image of the heart.  Images from an echocardiogram can provide important information about the size and shape of your heart, heart muscle function, heart valve function, and fluid buildup around your heart.  You do not need to do anything to prepare before this procedure. You may eat and drink normally.  After the echocardiogram is completed, you may return to your normal, everyday life, unless your health care provider tells you not to do that. This information is not intended to replace advice given to you by your health care provider. Make sure you discuss any questions you have with your health care provider. Document Released: 12/16/1999 Document Revised: 04/10/2018 Document Reviewed: 01/21/2016 Elsevier Patient Education  2020 [employer].   Ambulatory Cardiac Monitoring An ambulatory cardiac monitor is a small recording device that is used to detect abnormal heart rhythms (arrhythmias). Most monitors are connected by wires to flat, sticky disks (electrodes) that are then attached to your chest. You may need to wear a monitor if you have had symptoms such as:  Fast heartbeats (palpitations).  Dizziness.  Fainting or light-headedness.  Unexplained weakness.  Shortness of breath. There are several types of monitors. Some common monitors include:  Holter monitor. This records your heart rhythm continuously, usually for 24-48 hours.  Event (episodic) monitor. This monitor has a symptoms button, and when pushed, it will begin recording. You need to activate this monitor to record when you have a heart-related symptom.  Automatic detection monitor. This monitor will begin recording when it detects an abnormal heartbeat. What are the risks? Generally, these devices are safe to use. However, it is possible that the skin under  the electrodes will become irritated. How to prepare for monitoring Your health care provider will prepare your chest for the electrode placement and show you how to use the monitor.  Do not apply lotions to your chest before monitoring.  Follow directions on how to care for the monitor, and how to return the monitor when the testing period is complete. How to use your cardiac monitor  Follow directions about how long to wear the monitor, and if you can take the monitor off in order to shower or bathe. ? Do not let the monitor get wet. ? Do not bathe, swim, or use a hot tub while wearing the monitor.  Keep your skin clean. Do not put body lotion or moisturizer on your chest.  Change the electrodes as told by your health care provider, or any time they stop sticking to your skin. You may need to use medical tape to keep them on.  Try to put the electrodes in slightly different places on your chest to help prevent skin irritation. Follow directions from your health care provider about where to place the electrodes.  Make sure the monitor is safely clipped to your clothing or in a location close to your body as recommended by your health care provider.  If your monitor has a symptoms button, press the button to mark an event as soon as you feel a heart-related symptom, such as: ?  Dizziness. ? Weakness. ? Light-headedness. ? Palpitations. ? Thumping or pounding in your chest. ? Shortness of breath. ? Unexplained weakness.  Keep a diary of your activities, such as walking, doing chores, and taking medicine. It is very important to note what you were doing when you pushed the button to record your symptoms. This will help your health care provider determine what might be contributing to your symptoms.  Send the recorded information as recommended by your health care provider. It may take some time for your health care provider to process the results.  Change the batteries as told by your  health care provider.  Keep electronic devices away from your monitor. These include: ? Tablets. ? MP3 players. ? Cell phones.  While wearing your monitor you should avoid: ? Electric blankets. ? Firefighterlectric razors. ? Electric toothbrushes. ? Microwave ovens. ? Magnets. ? Metal detectors. Get help right away if:  You have chest pain.  You have shortness of breath or extreme difficulty breathing.  You develop a very fast heartbeat that does not get better.  You develop dizziness that does not go away.  You faint or constantly feel like you are about to faint. Summary  An ambulatory cardiac monitor is a small recording device that is used to detect abnormal heart rhythms (arrhythmias).  Make sure you understand how to send the information from the monitor to your health care provider.  It is important to press the button on the monitor when you have any heart-related symptoms.  Keep a diary of your activities, such as walking, doing chores, and taking medicine. It is very important to note what you were doing when you pushed the button to record your symptoms. This will help your health care provider learn what might be causing your symptoms. This information is not intended to replace advice given to you by your health care provider. Make sure you discuss any questions you have with your health care provider. Document Released: 09/27/2007 Document Revised: 11/30/2016 Document Reviewed: 12/03/2015 Elsevier Patient Education  2020 [employer]

## 2018-12-17 NOTE — Progress Notes (Signed)
Cardiology Office Note:    Date:  12/17/2018   ID:  Sheila Kirby, DOB Aug 22, 1995, MRN [id number]  PCP:  Truett Mainland, DO  Cardiologist:  Jenean Lindau, MD   Referring MD: Truett Mainland, DO    ASSESSMENT:    1. Palpitations   2. Cardiac murmur    PLAN:    In order of problems listed above:  1. Palpitations: I reassured the patient about my findings.  I told her to take extra salt and fluid in her diet.  Also various maneuvers were explained to her such as compression stockings and keeping her ankles active whenever feet she feels this way so that she can have better blood return to her heart.  This was explained and she vocalized understanding.  Fall precautions were also advised.  Evaluate this she will have a 2-week ZIO monitor.  Her TSH was unremarkable. 2. Cardiac murmur, echo cardiogram will be done to assess this.  She knows to go to the nearest emergency room for any concerning symptoms.Patient will be seen in follow-up appointment in 2 months or earlier if the patient has any concerns    Medication Adjustments/Labs and Tests Ordered: Current medicines are reviewed at length with the patient today.  Concerns regarding medicines are outlined above.  No orders of the defined types were placed in this encounter.  No orders of the defined types were placed in this encounter.    History of Present Illness:    Sheila Kirby is a 23 y.o. female who is being seen today for the evaluation of palpitations at the request of Truett Mainland, DO.  Patient is a pleasant 23 year old female.  She has past medical history that is not very significant.  Patient mentions to me that in the past couple of weeks she has palpitations.  She feels lightheaded at times but has never passed out.  This is of concern to her.  She tries to keep herself hydrated by drinking water.  At the time of my evaluation, the patient is alert awake oriented and in no distress.  Patient denies  any medical history that significant no history of hypertension dyslipidemia diabetes mellitus or any congenital heart disease.  She had good effort tolerance before her pregnancy.  At the time of my evaluation, the patient is alert awake oriented and in no distress.  Past Medical History:  Diagnosis Date  . Medical history non-contributory     Past Surgical History:  Procedure Laterality Date  . NO PAST SURGERIES    . WISDOM TOOTH EXTRACTION      Current Medications: Current Meds  Medication Sig  . pantoprazole (PROTONIX) 20 MG tablet Take 20 mg by mouth daily.  . Prenatal Vit-Fe Fumarate-FA (PRENATAL VITAMINS PO) Take by mouth.     Allergies:   Patient has no known allergies.   Social History   Socioeconomic History  . Marital status: Married    Spouse name: Not on file  . Number of children: Not on file  . Years of education: Not on file  . Highest education level: Not on file  Occupational History  . Not on file  Tobacco Use  . Smoking status: Never Smoker  . Smokeless tobacco: Never Used  Substance and Sexual Activity  . Alcohol use: No  . Drug use: No  . Sexual activity: Yes    Birth control/protection: None  Other Topics Concern  . Not on file  Social History Narrative  . Not  on file   Social Determinants of Health   Financial Resource Strain:   . Difficulty of Paying Living Expenses: Not on file  Food Insecurity:   . Worried About Programme researcher, broadcasting/film/video in the Last Year: Not on file  . Ran Out of Food in the Last Year: Not on file  Transportation Needs:   . Lack of Transportation (Medical): Not on file  . Lack of Transportation (Non-Medical): Not on file  Physical Activity:   . Days of Exercise per Week: Not on file  . Minutes of Exercise per Session: Not on file  Stress:   . Feeling of Stress : Not on file  Social Connections:   . Frequency of Communication with Friends and Family: Not on file  . Frequency of Social Gatherings with Friends and  Family: Not on file  . Attends Religious Services: Not on file  . Active Member of Clubs or Organizations: Not on file  . Attends Banker Meetings: Not on file  . Marital Status: Not on file     Family History: The patient's family history includes Cancer in her maternal grandfather, paternal grandfather, and sister; Diabetes in her father; Heart attack in her maternal aunt, maternal aunt, maternal aunt, and paternal grandmother; Hypertension in her father and mother; Spina bifida in her paternal uncle; Thyroid disease in her mother. There is no history of Stroke, Miscarriages / Stillbirths, or Mental retardation.  ROS:   Please see the history of present illness.    All other systems reviewed and are negative.  EKGs/Labs/Other Studies Reviewed:    The following studies were reviewed today: EKG reveals sinus rhythm and nonspecific ST-T changes   Recent Labs: 12/12/2018: ALT 18; BUN 6; BUN 6; Creatinine, Ser 0.61; Creatinine, Ser 0.58; Hemoglobin 11.6; Magnesium 1.9; Platelets 189; Potassium 4.3; Potassium 4.3; Sodium 136; Sodium 137; TSH 1.230  Recent Lipid Panel No results found for: CHOL, TRIG, HDL, CHOLHDL, VLDL, LDLCALC, LDLDIRECT  Physical Exam:    VS:  BP 110/80 (BP Location: Left Arm, Patient Position: Sitting, Cuff Size: Normal)   Pulse (!) 105   Ht 5\' 8"  (1.727 m)   Wt 217 lb (98.4 kg)   LMP 04/12/2018   SpO2 100%   BMI 32.99 kg/m     Wt Readings from Last 3 Encounters:  12/17/18 217 lb (98.4 kg)  12/12/18 215 lb (97.5 kg)  12/05/18 213 lb (96.6 kg)     GEN: Patient is in no acute distress HEENT: Normal NECK: No JVD; No carotid bruits LYMPHATICS: No lymphadenopathy CARDIAC: S1 S2 regular, 2/6 systolic murmur at the apex. RESPIRATORY:  Clear to auscultation without rales, wheezing or rhonchi  ABDOMEN: Soft, non-tender, non-distended MUSCULOSKELETAL:  No edema; No deformity  SKIN: Warm and dry NEUROLOGIC:  Alert and oriented x 3 PSYCHIATRIC:   Normal affect    Signed, 14/04/20, MD  12/17/2018 3:07 PM    [facility]

## 2018-12-18 ENCOUNTER — Ambulatory Visit (HOSPITAL_BASED_OUTPATIENT_CLINIC_OR_DEPARTMENT_OTHER)
Admission: RE | Admit: 2018-12-18 | Discharge: 2018-12-18 | Disposition: A | Discharge status: Home / Self Care | Payer: 59 | Source: Ambulatory Visit | Attending: Cardiology | Admitting: Cardiology

## 2018-12-18 ENCOUNTER — Other Ambulatory Visit: Payer: Self-pay

## 2018-12-18 DIAGNOSIS — R002 Palpitations: Secondary | ICD-10-CM

## 2018-12-18 DIAGNOSIS — R011 Cardiac murmur, unspecified: Secondary | ICD-10-CM

## 2018-12-18 NOTE — Progress Notes (Signed)
  Echocardiogram 2D Echocardiogram has been performed.  Sheila Kirby 12/18/2018, 3:49 PM

## 2018-12-22 ENCOUNTER — Ambulatory Visit (INDEPENDENT_AMBULATORY_CARE_PROVIDER_SITE_OTHER): Payer: 59 | Admitting: Family Medicine

## 2018-12-22 ENCOUNTER — Other Ambulatory Visit: Payer: Self-pay

## 2018-12-22 ENCOUNTER — Ambulatory Visit: Payer: 59 | Attending: Internal Medicine

## 2018-12-22 ENCOUNTER — Other Ambulatory Visit: Payer: 59

## 2018-12-22 VITALS — BP 132/74 | HR 91 | Wt 218.0 lb

## 2018-12-22 DIAGNOSIS — R42 Dizziness and giddiness: Secondary | ICD-10-CM

## 2018-12-22 DIAGNOSIS — Z3480 Encounter for supervision of other normal pregnancy, unspecified trimester: Secondary | ICD-10-CM | POA: Diagnosis not present

## 2018-12-22 DIAGNOSIS — Z3A36 36 weeks gestation of pregnancy: Secondary | ICD-10-CM

## 2018-12-22 DIAGNOSIS — Z20822 Contact with and (suspected) exposure to covid-19: Secondary | ICD-10-CM

## 2018-12-22 DIAGNOSIS — Z3483 Encounter for supervision of other normal pregnancy, third trimester: Secondary | ICD-10-CM

## 2018-12-22 DIAGNOSIS — Z113 Encounter for screening for infections with a predominantly sexual mode of transmission: Secondary | ICD-10-CM | POA: Diagnosis not present

## 2018-12-22 DIAGNOSIS — Z20828 Contact with and (suspected) exposure to other viral communicable diseases: Secondary | ICD-10-CM | POA: Diagnosis not present

## 2018-12-22 DIAGNOSIS — R55 Syncope and collapse: Secondary | ICD-10-CM

## 2018-12-22 NOTE — Progress Notes (Addendum)
   PRENATAL VISIT NOTE  Subjective:  Sheila Kirby is a 23 y.o. G3P2002 at [redacted]w[redacted]d being seen today for ongoing prenatal care.  She is currently monitored for the following issues for this low-risk pregnancy and has Family history of spina bifida; Supervision of other normal pregnancy, antepartum; Short interval between pregnancies affecting pregnancy, antepartum; Palpitations; and Cardiac murmur on their problem list.  Patient reports continues to have episodes of presyncope, heart racing, etc. Saw cardiologist. Wearing heart monitor..  Contractions: Not present. Vag. Bleeding: None.  Movement: (!) Decreased. Denies leaking of fluid.   The following portions of the patient's history were reviewed and updated as appropriate: allergies, current medications, past family history, past medical history, past social history, past surgical history and problem list.   Objective:   Vitals:   12/22/18 0811  BP: 132/74  Pulse: 91  Weight: 218 lb (98.9 kg)    Fetal Status: Fetal Heart Rate (bpm): R-NST   Movement: (!) Decreased  Presentation: Vertex  General:  Alert, oriented and cooperative. Patient is in no acute distress.  Skin: Skin is warm and dry. No rash noted.   Cardiovascular: Normal heart rate noted  Respiratory: Normal respiratory effort, no problems with respiration noted  Abdomen: Soft, gravid, appropriate for gestational age.  Pain/Pressure: Present     Pelvic: Cervical exam performed Dilation: 2.5 Effacement (%): 50 Station: -2  Extremities: Normal range of motion.  Edema: Trace  Mental Status: Normal mood and affect. Normal behavior. Normal judgment and thought content.   Assessment and Plan:  Pregnancy: G3P2002 at [redacted]w[redacted]d 1. Supervision of other normal pregnancy, antepartum FHT and FH normal - Culture, beta strep (group b only) - GC/Chlamydia probe amp ([place]not at [facility]  2. Postural dizziness with presyncope Will wait for results of heart monitor.   Preterm labor  symptoms and general obstetric precautions including but not limited to vaginal bleeding, contractions, leaking of fluid and fetal movement were reviewed in detail with the patient. Please refer to After Visit Summary for other counseling recommendations.   Return in about 1 week (around 12/29/2018) for OB f/u, In Office.  Future Appointments  Date Time Provider [place]  12/29/2018  8:30 AM Lavonia Drafts, MD [facility] None  03/18/2019  3:55 PM Revankar, Reita Cliche, MD [facility] None    Truett Mainland, DO

## 2018-12-23 ENCOUNTER — Encounter: Payer: Self-pay | Admitting: *Deleted

## 2018-12-23 ENCOUNTER — Telehealth: Payer: Self-pay | Admitting: *Deleted

## 2018-12-23 LAB — GC/CHLAMYDIA PROBE AMP (~~LOC~~) NOT AT ARMC
Chlamydia: NEGATIVE
Comment: NEGATIVE
Comment: NORMAL
Neisseria Gonorrhea: NEGATIVE

## 2018-12-23 LAB — NOVEL CORONAVIRUS, NAA: SARS-CoV-2, NAA: NOT DETECTED

## 2018-12-23 NOTE — Telephone Encounter (Signed)
Telephone call to patient. Left message that echo was unremarkable and to call with any questions.

## 2018-12-23 NOTE — Telephone Encounter (Signed)
-----   Message from Jenean Lindau, MD sent at 12/19/2018  3:06 PM EST ----- The results of the study is unremarkable. Please inform patient. I will discuss in detail at next appointment. [facility]c  primary care/referring physician Jenean Lindau, MD 12/19/2018 3:06 PM

## 2018-12-24 ENCOUNTER — Encounter: Payer: Self-pay | Admitting: Adult Health Nurse Practitioner

## 2018-12-24 ENCOUNTER — Other Ambulatory Visit: Payer: Self-pay

## 2018-12-24 ENCOUNTER — Encounter (HOSPITAL_COMMUNITY): Payer: Self-pay | Admitting: Emergency Medicine

## 2018-12-24 ENCOUNTER — Ambulatory Visit (HOSPITAL_COMMUNITY)
Admission: EM | Admit: 2018-12-24 | Discharge: 2018-12-24 | Disposition: A | Discharge status: Home / Self Care | Payer: 59 | Attending: Family Medicine | Admitting: Family Medicine

## 2018-12-24 ENCOUNTER — Telehealth (INDEPENDENT_AMBULATORY_CARE_PROVIDER_SITE_OTHER): Payer: 59 | Admitting: Adult Health Nurse Practitioner

## 2018-12-24 DIAGNOSIS — R0989 Other specified symptoms and signs involving the circulatory and respiratory systems: Secondary | ICD-10-CM | POA: Diagnosis not present

## 2018-12-24 DIAGNOSIS — J029 Acute pharyngitis, unspecified: Secondary | ICD-10-CM

## 2018-12-24 HISTORY — DX: Acute pharyngitis, unspecified: J02.9

## 2018-12-24 HISTORY — DX: Other specified symptoms and signs involving the circulatory and respiratory systems: R09.89

## 2018-12-24 LAB — POCT RAPID STREP A: Streptococcus, Group A Screen (Direct): NEGATIVE

## 2018-12-24 NOTE — Patient Instructions (Signed)
° ° ° °  If you have lab work done today you will be contacted with your lab results within the next 2 weeks.  If you have not heard from us then please contact us. The fastest way to get your results is to register for My Chart. ° ° °IF you received an x-ray today, you will receive an invoice from [facility]. Please contact [facility] at [phone] with questions or concerns regarding your invoice.  ° °IF you received labwork today, you will receive an invoice from [facility]. Please contact [facility] at [phone] with questions or concerns regarding your invoice.  ° °Our billing staff will not be able to assist you with questions regarding bills from these companies. ° °You will be contacted with the lab results as soon as they are available. The fastest way to get your results is to activate your My Chart account. Instructions are located on the last page of this paperwork. If you have not heard from us regarding the results in 2 weeks, please contact this office. °  ° ° ° °

## 2018-12-24 NOTE — Progress Notes (Signed)
Telemedicine Encounter- SOAP NOTE Established Patient  This telephone encounter was conducted with the patient's (or proxy's) verbal consent via audio telecommunications: yes/no: Yes Patient was instructed to have this encounter in a suitably private space; and to only have persons present to whom they give permission to participate. In addition, patient identity was confirmed by use of name plus two identifiers (DOB and address).  I discussed the limitations, risks, security and privacy concerns of performing an evaluation and management service by telephone and the availability of in person appointments. I also discussed with the patient that there may be a patient responsible charge related to this service. The patient expressed understanding and agreed to proceed.  I spent a total of TIME; 0 MIN TO 60 MIN: 20 minutes talking with the patient or their proxy.  Chief Complaint  Patient presents with  . Cough    Pt stated that she has been coughing for the pat 2 days with a headache when she coughs and a sore throat since 12/23/2018 and it is getting worse.     Subjective   Sheila Kirby is a 23 y.o. established patient. Telephone visit today for URI symptoms   HPI Patient reports that her husband was exposed to Covid last week and her household tested negative on Monday of this week.  He had no symptoms nor did anyone else.  However, she developed symptoms of a sore throat, cough, congestion, generalized malaise starting on Sunday.  Symptoms have been persistent.  She has not tried anything over-the-counter or any salt water gargles for throat pain.  She is wondering whether she had strep.  She is positive for headache.  Denies loss of taste or smell.  Some headache.  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  She states there is no where else she would have been exposed to Covid and had a negative test.  We discussed the timing of symptoms and the window of testing yielding  most accurate results.  She does not think she has Covid.  Patient Active Problem List   Diagnosis Date Noted  . Sore throat 12/24/2018  . Symptoms of upper respiratory infection (URI) 12/24/2018  . Palpitations 12/17/2018  . Cardiac murmur 12/17/2018  . Short interval between pregnancies affecting pregnancy, antepartum 10/30/2018  . Supervision of other normal pregnancy, antepartum 06/16/2018  . Family history of spina bifida 11/15/2014    Past Medical History:  Diagnosis Date  . Medical history non-contributory   . Sore throat 12/24/2018  . Symptoms of upper respiratory infection (URI) 12/24/2018    Current Outpatient Medications  Medication Sig Dispense Refill  . pantoprazole (PROTONIX) 20 MG tablet Take 20 mg by mouth daily.    . Prenatal Vit-Fe Fumarate-FA (PRENATAL VITAMINS PO) Take by mouth.     No current facility-administered medications for this visit.    No Known Allergies  Social History   Socioeconomic History  . Marital status: Married    Spouse name: Not on file  . Number of children: Not on file  . Years of education: Not on file  . Highest education level: Not on file  Occupational History  . Not on file  Tobacco Use  . Smoking status: Never Smoker  . Smokeless tobacco: Never Used  Substance and Sexual Activity  . Alcohol use: No  . Drug use: No  . Sexual activity: Yes    Birth control/protection: None  Other Topics Concern  . Not on file  Social  History Narrative  . Not on file   Social Determinants of Health   Financial Resource Strain:   . Difficulty of Paying Living Expenses: Not on file  Food Insecurity:   . Worried About Charity fundraiser in the Last Year: Not on file  . Ran Out of Food in the Last Year: Not on file  Transportation Needs:   . Lack of Transportation (Medical): Not on file  . Lack of Transportation (Non-Medical): Not on file  Physical Activity:   . Days of Exercise per Week: Not on file  . Minutes of Exercise per  Session: Not on file  Stress:   . Feeling of Stress : Not on file  Social Connections:   . Frequency of Communication with Friends and Family: Not on file  . Frequency of Social Gatherings with Friends and Family: Not on file  . Attends Religious Services: Not on file  . Active Member of Clubs or Organizations: Not on file  . Attends Archivist Meetings: Not on file  . Marital Status: Not on file  Intimate Partner Violence:   . Fear of Current or Ex-Partner: Not on file  . Emotionally Abused: Not on file  . Physically Abused: Not on file  . Sexually Abused: Not on file    Review of Systems  Constitutional: Positive for malaise/fatigue. Negative for chills, diaphoresis and fever.  HENT: Positive for congestion, sinus pain and sore throat. Negative for ear pain.   Eyes: Negative.   Respiratory: Positive for cough and sputum production. Negative for shortness of breath.   Cardiovascular: Negative for chest pain and orthopnea.  Skin: Negative.   Neurological: Positive for headaches.  Endo/Heme/Allergies: Negative.     Objective    General appearance: alert, well appearing, and in no distress. Mental Status: normal mood, behavior, speech, dress, motor activity, and thought processes, affect appropriate to mood.  Vitals as reported by the patient: There were no vitals filed for this visit.  Sheila Kirby was seen today for cough.  Diagnoses and all orders for this visit:  Sore throat -     Novel Coronavirus, NAA ([facility])  Symptoms of upper respiratory infection (URI) -     Novel Coronavirus, NAA ([facility])   I sent a MyChart message to the patient.  I have recommended and she does have symptoms of an upper respiratory infection that if she wanted any testing for strep throat, influenza, she would need to go to an urgent care.  I have recommended a second Covid test given the symptom window and the previous negative test.  She will consider.  Recommended salt water gargles  but they make her nauseous so recommended gargling with anything and Tylenol, Mucinex for symptoms of sore throat, headache, malaise and fatigue.  Increase fluids.  If fever chills or night sweats would highly recommend going to get Covid testing.  Either way she does have symptoms of a viral URI and would not recommend exposing herself in close contact to any relative until she has been 24 hours without symptoms or has a negative influenza, strep, Covid.  Patient verbalized understanding and will follow up in urgent care  I discussed the assessment and treatment plan with the patient. The patient was provided an opportunity to ask questions and all were answered. The patient agreed with the plan and demonstrated an understanding of the instructions.   The patient was advised to call back or seek an in-person evaluation if the symptoms worsen or if the condition  fails to improve as anticipated.  I provided 20 minutes of non-face-to-face time during this encounter.  Elyse Jarvis, NP  Primary Care at [facility]

## 2018-12-24 NOTE — ED Provider Notes (Addendum)
[facility]    CSN: [id number] Arrival date & time: 12/24/18  1233      History   Chief Complaint Chief Complaint  Patient presents with  . Sore Throat    HPI Sheila Kirby is a 23 y.o. female.   Established [facility] patient  Sore throat since Sunday.  Denies any fever.  Patient feels like she has a cold, but throat is quite sore.  Patient had a covid test on Monday and is was negative  Patient is 36-1/[redacted] weeks pregnant.  She is having no fever.  She has had strep infections with her last 2 pregnancies.  She is currently gravida 3 para 2     Past Medical History:  Diagnosis Date  . Medical history non-contributory   . Sore throat 12/24/2018  . Symptoms of upper respiratory infection (URI) 12/24/2018    Patient Active Problem List   Diagnosis Date Noted  . Sore throat 12/24/2018  . Symptoms of upper respiratory infection (URI) 12/24/2018  . Palpitations 12/17/2018  . Cardiac murmur 12/17/2018  . Short interval between pregnancies affecting pregnancy, antepartum 10/30/2018  . Supervision of other normal pregnancy, antepartum 06/16/2018  . Family history of spina bifida 11/15/2014    Past Surgical History:  Procedure Laterality Date  . NO PAST SURGERIES    . WISDOM TOOTH EXTRACTION      OB History    Gravida  3   Para  2   Term  2   Preterm      AB      Living  2     SAB      TAB      Ectopic      Multiple  0   Live Births  2            Home Medications    Prior to Admission medications   Not on File    Family History Family History  Problem Relation Age of Onset  . Hypertension Mother   . Thyroid disease Mother   . Diabetes Father   . Hypertension Father   . Cancer Sister        skin cancer  . Cancer Maternal Grandfather        skin  . Cancer Paternal Grandfather        renal  . Spina bifida Paternal Uncle   . Heart attack Paternal Grandmother   . Heart attack Maternal Aunt   . Heart attack Maternal  Aunt   . Heart attack Maternal Aunt   . Stroke Neg Hx   . Miscarriages / Stillbirths Neg Hx   . Mental retardation Neg Hx     Social History Social History   Tobacco Use  . Smoking status: Never Smoker  . Smokeless tobacco: Never Used  Substance Use Topics  . Alcohol use: No  . Drug use: No     Allergies   Patient has no known allergies.   Review of Systems Review of Systems   Physical Exam Triage Vital Signs ED Triage Vitals  Enc Vitals Group     BP 12/24/18 1306 125/77     Pulse Rate 12/24/18 1306 (!) 107     Resp 12/24/18 1306 20     Temp 12/24/18 1306 98.1 F (36.7 C)     Temp Source 12/24/18 1306 Oral     SpO2 12/24/18 1306 99 %     Weight --      Height --  Head Circumference --      Peak Flow --      Pain Score 12/24/18 1304 5     Pain Loc --      Pain Edu? --      Excl. in [place]? --    No data found.  Updated Vital Signs BP 125/77 (BP Location: Right Arm)   Pulse (!) 107   Temp 98.1 F (36.7 C) (Oral)   Resp 20   LMP 04/12/2018   SpO2 99%    Physical Exam Vitals and nursing note reviewed.  Constitutional:      General: She is not in acute distress.    Appearance: She is well-developed. She is not toxic-appearing.  HENT:     Mouth/Throat:     Mouth: Mucous membranes are moist.     Pharynx: Uvula midline. Posterior oropharyngeal erythema present. No pharyngeal swelling.     Tonsils: No tonsillar exudate.  Eyes:     Conjunctiva/sclera: Conjunctivae normal.     Pupils: Pupils are equal, round, and reactive to light.  Cardiovascular:     Rate and Rhythm: Normal rate.  Pulmonary:     Effort: Pulmonary effort is normal.  Skin:    General: Skin is warm.  Neurological:     General: No focal deficit present.     Mental Status: She is alert and oriented to person, place, and time.  Psychiatric:        Mood and Affect: Mood normal.      UC Treatments / Results  Labs (all labs ordered are listed, but only abnormal results are  displayed) Labs Reviewed  CULTURE, GROUP A STREP [facility])  POCT RAPID STREP A    EKG   Radiology No results found.  Procedures Procedures (including critical care time)  Medications Ordered in UC Medications - No data to display  Initial Impression / Assessment and Plan / UC Course  I have reviewed the triage vital signs and the nursing notes.  Pertinent labs & imaging results that were available during my care of the patient were reviewed by me and considered in my medical decision making (see chart for details).    Final Clinical Impressions(s) / UC Diagnoses   Final diagnoses:  Viral pharyngitis   Discharge Instructions   None    ED Prescriptions    None     PDMP not reviewed this encounter.   Robyn Haber, MD 12/24/18 1349    Robyn Haber, MD 12/24/18 1350

## 2018-12-24 NOTE — ED Triage Notes (Signed)
Sore throat since Sunday.  Denies any fever.  Patient feels like she has a cold, but throat is quite sore.  Patient had a covid test on Monday and is was negative

## 2018-12-26 LAB — CULTURE, GROUP A STREP (THRC)

## 2018-12-26 LAB — CULTURE, BETA STREP (GROUP B ONLY): Strep Gp B Culture: NEGATIVE

## 2018-12-29 ENCOUNTER — Ambulatory Visit (INDEPENDENT_AMBULATORY_CARE_PROVIDER_SITE_OTHER): Payer: 59 | Admitting: Obstetrics & Gynecology

## 2018-12-29 ENCOUNTER — Other Ambulatory Visit: Payer: Self-pay

## 2018-12-29 VITALS — BP 120/66 | HR 107 | Wt 217.1 lb

## 2018-12-29 DIAGNOSIS — Z3A37 37 weeks gestation of pregnancy: Secondary | ICD-10-CM

## 2018-12-29 DIAGNOSIS — O09893 Supervision of other high risk pregnancies, third trimester: Secondary | ICD-10-CM

## 2018-12-29 DIAGNOSIS — O09899 Supervision of other high risk pregnancies, unspecified trimester: Secondary | ICD-10-CM

## 2018-12-29 DIAGNOSIS — Z3480 Encounter for supervision of other normal pregnancy, unspecified trimester: Secondary | ICD-10-CM

## 2018-12-29 NOTE — Patient Instructions (Signed)

## 2018-12-29 NOTE — Progress Notes (Signed)
   PRENATAL VISIT NOTE  Subjective:  Sheila Kirby is a 23 y.o. G3P2002 at [redacted]w[redacted]d being seen today for ongoing prenatal care.  She is currently monitored for the following issues for this low-risk pregnancy and has Family history of spina bifida; Supervision of other normal pregnancy, antepartum; Short interval between pregnancies affecting pregnancy, antepartum; Palpitations; Cardiac murmur; Sore throat; and Symptoms of upper respiratory infection (URI) on their problem list.  Patient reports no complaints. She reports that she has not felt the baby move as well this pregnancy as in other pregnancies. She does feel movement and is not worried. She reports that she has been checking the FHR at home.  Contractions: Irregular. Vag. Bleeding: None.  Movement: Present. Denies leaking of fluid.   The following portions of the patient's history were reviewed and updated as appropriate: allergies, current medications, past family history, past medical history, past social history, past surgical history and problem list.   Objective:   Vitals:   12/29/18 0842  BP: 120/66  Pulse: (!) 107  Weight: 217 lb 1.9 oz (98.5 kg)    Fetal Status: Fetal Heart Rate (bpm): 156   Movement: Present     General:  Alert, oriented and cooperative. Patient is in no acute distress.  Skin: Skin is warm and dry. No rash noted.   Cardiovascular: Normal heart rate noted  Respiratory: Normal respiratory effort, no problems with respiration noted  Abdomen: Soft, gravid, appropriate for gestational age.  Pain/Pressure: Present     Pelvic: Cervical exam deferred        Extremities: Normal range of motion.  Edema: Trace  Mental Status: Normal mood and affect. Normal behavior. Normal judgment and thought content.   Assessment and Plan:  Pregnancy: G3P2002 at [redacted]w[redacted]d 1. Supervision of other normal pregnancy, antepartum FH WNL Reviewed fetal movement Reviewed with the the FHR alone is not a guarantee of fetal well  being. She was encouraged to call if she has concerns.   Pt has had a recent Covid test. She denies current sx.   2. Short interval between pregnancies affecting pregnancy, antepartum   Term labor symptoms and general obstetric precautions including but not limited to vaginal bleeding, contractions, leaking of fluid and fetal movement were reviewed in detail with the patient. Please refer to After Visit Summary for other counseling recommendations.   Return in about 1 week (around 01/05/2019) for in person.  Future Appointments  Date Time Provider [place]  01/08/2019  8:45 AM Truett Mainland, DO [facility] None  03/18/2019  3:55 PM Revankar, Reita Cliche, MD [facility] None    Lavonia Drafts, MD

## 2019-01-02 NOTE — L&D Delivery Note (Addendum)
Delivery Note At 5:25 PM a viable female was delivered via Vaginal, Spontaneous (Presentation:  vertex).  APGAR: 9, 9; weight 8# 6.6oz .   Placenta status: spontaneous, intact.  Cord: 3vc with the following complications: none.  Called to patient's room for precipitous delivery. Patient was in the bathroom holding baby when I arrived. Cord clamped and cut. Patient transported to the bed for delivery of placenta and exam. Patient stable on my departure.  Anesthesia:  IV fentanyl Episiotomy:  none Lacerations:  None Est. Blood Loss (mL):  Mom to postpartum.  Baby to Couplet care / Skin to Skin.  [name], DO 01/13/2019, 5:57 PM

## 2019-01-06 ENCOUNTER — Other Ambulatory Visit: Payer: Self-pay

## 2019-01-06 ENCOUNTER — Encounter (HOSPITAL_COMMUNITY): Payer: Self-pay | Admitting: Obstetrics and Gynecology

## 2019-01-06 ENCOUNTER — Telehealth: Payer: Self-pay

## 2019-01-06 ENCOUNTER — Inpatient Hospital Stay (HOSPITAL_COMMUNITY)
Admission: AD | Admit: 2019-01-06 | Discharge: 2019-01-06 | Disposition: A | Discharge status: Home / Self Care | Payer: 59 | Attending: Obstetrics and Gynecology | Admitting: Obstetrics and Gynecology

## 2019-01-06 DIAGNOSIS — Z0371 Encounter for suspected problem with amniotic cavity and membrane ruled out: Secondary | ICD-10-CM | POA: Diagnosis not present

## 2019-01-06 DIAGNOSIS — Z3A38 38 weeks gestation of pregnancy: Secondary | ICD-10-CM

## 2019-01-06 DIAGNOSIS — Z3480 Encounter for supervision of other normal pregnancy, unspecified trimester: Secondary | ICD-10-CM

## 2019-01-06 LAB — POCT FERN TEST

## 2019-01-06 LAB — WET PREP, GENITAL
Clue Cells Wet Prep HPF POC: NONE SEEN
Sperm: NONE SEEN
Trich, Wet Prep: NONE SEEN
Yeast Wet Prep HPF POC: NONE SEEN

## 2019-01-06 NOTE — Telephone Encounter (Signed)
Patient called and is [redacted] weeks pregnant. Patient states that she had an epsiode of leaking fluid today and states she know it was not urine. Patient states she did have a similar episode on Sunday (48 hours ago) and states she just didn't think much about it. Patient denies regular contractions or bleeding. Patient states baby is moving well. Explained that at this point I dont have a provider in the office until tomorrow (Wed) afternoon. If she is leaking fluid she needs to be evaluated before then - advise her to seek care at Maternity Admissions unit at [facility]. Patient agrees with plan. [name] RN

## 2019-01-06 NOTE — MAU Note (Signed)
Had a small trickle of clear.  Today had more leaking. One time each day.  Called office, told her to come get checked. No bleeding. Some contractions in her back, off and on- not regular.

## 2019-01-06 NOTE — MAU Provider Note (Signed)
First Provider Initiated Contact with Patient 01/06/19 1640     S: Ms. Sheila Kirby is a 24 y.o. G3P2002 at [redacted]w[redacted]d  who presents to MAU complaining of leaking of fluid. This is a new problem, onset today but she also endorses a trickle of fluid Sunday 01/04/2019. She verbalizes concern for a high leak. She denies vaginal bleeding. She denies recurrent painful contractions. She reports normal fetal movement.  She is remote from intercourse.   O: BP 129/82 (BP Location: Right Arm)   Pulse (!) 105   Temp 98.3 F (36.8 C) (Oral)   Resp 18   Ht 5\' 8"  (1.727 m)   Wt 99.9 kg   LMP 04/12/2018   SpO2 99%   BMI 33.48 kg/m  GENERAL: Well-developed, well-nourished female in no acute distress.  HEAD: Normocephalic, atraumatic.  CHEST: Normal effort of breathing, regular heart rate ABDOMEN: Soft, nontender, gravid PELVIC: Normal external female genitalia. Vagina is pink and rugated. Cervix with normal contour, no lesions.  Scant amount of thick white vaginal discharge noted.  Negative pooling.   Cervical exam (unchanged from previous):  Dilation: 2.5 Effacement (%): 50 Cervical Position: Posterior Exam by:: Holly Flippin RN    Fetal Monitoring: Baseline: 150 Variability: Mod Accelerations: POS 15 x 15 Decelerations: N/A Contractions: UI  Results for orders placed or performed during the hospital encounter of 01/06/19 (from the past 24 hour(s))  POCT fern test     Status: None   Collection Time: 01/06/19  5:26 PM  Result Value Ref Range   POCT [place] Test    [place] Test     Status: None   Collection Time: 01/06/19  5:27 PM  Result Value Ref Range   POCT 03/06/19 prep, genital     Status: Abnormal   Collection Time: 01/06/19  5:51 PM  Result Value Ref Range   Yeast Wet Prep HPF POC NONE SEEN NONE SEEN   Trich, Wet Prep NONE SEEN NONE SEEN   Clue Cells Wet Prep HPF POC NONE SEEN NONE SEEN   WBC, Wet Prep HPF POC FEW (A) NONE SEEN   Sperm NONE SEEN     A: SIUP at [redacted]w[redacted]d   Category I tracing Intact amniotic sac Negative pooling Negative active leaking with Valsalva on SSE Negative Fern Patient declined offer of Amnisure  P: Discharge home in stable condition  F/U: Next appointment [facility] 01/08/2019  03/08/2019, CNM 01/06/2019 6:40 PM

## 2019-01-06 NOTE — Discharge Instructions (Signed)

## 2019-01-08 ENCOUNTER — Ambulatory Visit (INDEPENDENT_AMBULATORY_CARE_PROVIDER_SITE_OTHER): Payer: 59 | Admitting: Family Medicine

## 2019-01-08 ENCOUNTER — Other Ambulatory Visit (HOSPITAL_COMMUNITY): Payer: Self-pay | Admitting: Advanced Practice Midwife

## 2019-01-08 ENCOUNTER — Other Ambulatory Visit: Payer: Self-pay

## 2019-01-08 ENCOUNTER — Telehealth (HOSPITAL_COMMUNITY): Payer: Self-pay | Admitting: *Deleted

## 2019-01-08 ENCOUNTER — Other Ambulatory Visit: Payer: Self-pay | Admitting: Family Medicine

## 2019-01-08 VITALS — BP 118/66 | HR 96 | Wt 218.0 lb

## 2019-01-08 DIAGNOSIS — R42 Dizziness and giddiness: Secondary | ICD-10-CM

## 2019-01-08 DIAGNOSIS — O09893 Supervision of other high risk pregnancies, third trimester: Secondary | ICD-10-CM

## 2019-01-08 DIAGNOSIS — Z8279 Family history of other congenital malformations, deformations and chromosomal abnormalities: Secondary | ICD-10-CM

## 2019-01-08 DIAGNOSIS — O99891 Other specified diseases and conditions complicating pregnancy: Secondary | ICD-10-CM | POA: Diagnosis not present

## 2019-01-08 DIAGNOSIS — Z3480 Encounter for supervision of other normal pregnancy, unspecified trimester: Secondary | ICD-10-CM

## 2019-01-08 DIAGNOSIS — M549 Dorsalgia, unspecified: Secondary | ICD-10-CM

## 2019-01-08 DIAGNOSIS — Z3A38 38 weeks gestation of pregnancy: Secondary | ICD-10-CM | POA: Diagnosis not present

## 2019-01-08 DIAGNOSIS — M9905 Segmental and somatic dysfunction of pelvic region: Secondary | ICD-10-CM

## 2019-01-08 DIAGNOSIS — M9904 Segmental and somatic dysfunction of sacral region: Secondary | ICD-10-CM | POA: Diagnosis not present

## 2019-01-08 DIAGNOSIS — M9903 Segmental and somatic dysfunction of lumbar region: Secondary | ICD-10-CM

## 2019-01-08 DIAGNOSIS — O09899 Supervision of other high risk pregnancies, unspecified trimester: Secondary | ICD-10-CM

## 2019-01-08 DIAGNOSIS — R55 Syncope and collapse: Secondary | ICD-10-CM

## 2019-01-08 NOTE — Progress Notes (Addendum)
   PRENATAL VISIT NOTE  Subjective:  Sheila Kirby is a 24 y.o. G3P2002 at [redacted]w[redacted]d being seen today for ongoing prenatal care.  She is currently monitored for the following issues for this low-risk pregnancy and has Family history of spina bifida; Supervision of other normal pregnancy, antepartum; Short interval between pregnancies affecting pregnancy, antepartum; Palpitations; Cardiac murmur; Sore throat; and Symptoms of upper respiratory infection (URI) on their problem list.  Patient reports backache.  Contractions: Irregular. Vag. Bleeding: None.  Movement: Present. Denies leaking of fluid.   The following portions of the patient's history were reviewed and updated as appropriate: allergies, current medications, past family history, past medical history, past social history, past surgical history and problem list. Problem list updated.  Objective:   Vitals:   01/08/19 0846  BP: 118/66  Pulse: 96  Weight: 218 lb (98.9 kg)    Fetal Status:     Movement: Present     General:  Alert, oriented and cooperative. Patient is in no acute distress.  Skin: Skin is warm and dry. No rash noted.   Cardiovascular: Normal heart rate noted  Respiratory: Normal respiratory effort, no problems with respiration noted  Abdomen: Soft, gravid, appropriate for gestational age. Pain/Pressure: Present     Pelvic:  Cervical exam deferred        MSK: Restriction, tenderness, tissue texture changes, and paraspinal spasm in the right lumbar spine  Neuro: Moves all four extremities with no focal neurological deficit  Extremities: Normal range of motion.  Edema: None  Mental Status: Normal mood and affect. Normal behavior. Normal judgment and thought content.   OSE: Head   Cervical   Thoracic   Rib   Lumbar L5 ESRR  Sacrum L/L  Pelvis Right ant inn    Assessment and Plan:  Pregnancy: G3P2002 at [redacted]w[redacted]d  1. Supervision of other normal pregnancy, antepartum FHT and FH normal. Induction this  Tuesday.  2. Short interval between pregnancies affecting pregnancy, antepartum   3. Postural dizziness with presyncope stable  4. Family history of spina bifida AFP normal  5. Back pain affecting pregnancy in second trimester 6. Somatic dysfunction of lumbar region 7. Somatic dysfunction of sacral region 8. Somatic dysfunction of pelvis region OMT done after patient permission. HVLA technique utilized. 3 areas treated with improvement of tissue texture and joint mobility. Patient tolerated procedure well.     Term labor symptoms and general obstetric precautions including but not limited to vaginal bleeding, contractions, leaking of fluid and fetal movement were reviewed in detail with the patient. Please refer to After Visit Summary for other counseling recommendations.  Return in about 5 weeks (around 02/12/2019) for Postpartum Exam.  [name], DO

## 2019-01-08 NOTE — Telephone Encounter (Signed)
Preadmission screen  

## 2019-01-09 ENCOUNTER — Telehealth (HOSPITAL_COMMUNITY): Payer: Self-pay | Admitting: *Deleted

## 2019-01-09 NOTE — Telephone Encounter (Signed)
Preadmission screen  

## 2019-01-10 ENCOUNTER — Other Ambulatory Visit (HOSPITAL_COMMUNITY)
Admission: RE | Admit: 2019-01-10 | Discharge: 2019-01-10 | Disposition: A | Discharge status: Home / Self Care | Payer: 59 | Source: Ambulatory Visit | Attending: Family Medicine | Admitting: Family Medicine

## 2019-01-10 DIAGNOSIS — Z20822 Contact with and (suspected) exposure to covid-19: Secondary | ICD-10-CM | POA: Insufficient documentation

## 2019-01-10 DIAGNOSIS — Z3A39 39 weeks gestation of pregnancy: Secondary | ICD-10-CM | POA: Diagnosis not present

## 2019-01-10 DIAGNOSIS — Z01812 Encounter for preprocedural laboratory examination: Secondary | ICD-10-CM | POA: Insufficient documentation

## 2019-01-10 LAB — SARS CORONAVIRUS 2 (TAT 6-24 HRS): SARS Coronavirus 2: NEGATIVE

## 2019-01-13 ENCOUNTER — Inpatient Hospital Stay (HOSPITAL_COMMUNITY): Payer: 59

## 2019-01-13 ENCOUNTER — Encounter (HOSPITAL_COMMUNITY): Payer: Self-pay | Admitting: Family Medicine

## 2019-01-13 ENCOUNTER — Other Ambulatory Visit: Payer: Self-pay

## 2019-01-13 ENCOUNTER — Inpatient Hospital Stay (HOSPITAL_COMMUNITY)
Admission: AD | Admit: 2019-01-13 | Discharge: 2019-01-14 | DRG: 807 | Disposition: A | Discharge status: Home / Self Care | Payer: 59 | Attending: Family Medicine | Admitting: Family Medicine

## 2019-01-13 DIAGNOSIS — Z3480 Encounter for supervision of other normal pregnancy, unspecified trimester: Secondary | ICD-10-CM

## 2019-01-13 DIAGNOSIS — Z3A39 39 weeks gestation of pregnancy: Secondary | ICD-10-CM | POA: Diagnosis not present

## 2019-01-13 DIAGNOSIS — Z20822 Contact with and (suspected) exposure to covid-19: Secondary | ICD-10-CM | POA: Diagnosis present

## 2019-01-13 DIAGNOSIS — Z3490 Encounter for supervision of normal pregnancy, unspecified, unspecified trimester: Secondary | ICD-10-CM | POA: Diagnosis present

## 2019-01-13 DIAGNOSIS — O26893 Other specified pregnancy related conditions, third trimester: Secondary | ICD-10-CM | POA: Diagnosis present

## 2019-01-13 DIAGNOSIS — O09899 Supervision of other high risk pregnancies, unspecified trimester: Secondary | ICD-10-CM

## 2019-01-13 LAB — TYPE AND SCREEN
ABO/RH(D): O POS
Antibody Screen: NEGATIVE

## 2019-01-13 LAB — CBC
HCT: 35.4 % — ABNORMAL LOW (ref 36.0–46.0)
Hemoglobin: 11.9 g/dL — ABNORMAL LOW (ref 12.0–15.0)
MCH: 29.3 pg (ref 26.0–34.0)
MCHC: 33.6 g/dL (ref 30.0–36.0)
MCV: 87.2 fL (ref 80.0–100.0)
Platelets: 188 10*3/uL (ref 150–400)
RBC: 4.06 MIL/uL (ref 3.87–5.11)
RDW: 12 % (ref 11.5–15.5)
WBC: 7.3 10*3/uL (ref 4.0–10.5)
nRBC: 0 % (ref 0.0–0.2)

## 2019-01-13 LAB — ABO/RH: ABO/RH(D): O POS

## 2019-01-13 LAB — RPR: RPR Ser Ql: NONREACTIVE

## 2019-01-13 MED ORDER — FENTANYL CITRATE (PF) 100 MCG/2ML IJ SOLN
100.0000 ug | INTRAMUSCULAR | Status: DC | PRN
  Filled 2019-01-13: qty 2

## 2019-01-13 MED ORDER — TERBUTALINE SULFATE 1 MG/ML IJ SOLN: 0.2500 mg | Freq: Once | INTRAMUSCULAR | Status: DC | PRN

## 2019-01-13 MED ORDER — LIDOCAINE HCL (PF) 1 % IJ SOLN: 30.0000 mL | INTRAMUSCULAR | Status: DC | PRN

## 2019-01-13 MED ORDER — ONDANSETRON HCL 4 MG PO TABS: 4.0000 mg | ORAL_TABLET | ORAL | Status: DC | PRN

## 2019-01-13 MED ORDER — FENTANYL CITRATE (PF) 100 MCG/2ML IJ SOLN
INTRAMUSCULAR | Status: AC
  Administered 2019-01-13: 17:00:00 100 ug via INTRAVENOUS
  Filled 2019-01-13: qty 2

## 2019-01-13 MED ORDER — MISOPROSTOL 25 MCG QUARTER TABLET: 25.0000 ug | ORAL_TABLET | ORAL | Status: DC | PRN

## 2019-01-13 MED ORDER — WITCH HAZEL-GLYCERIN EX PADS: 1.0000 "application " | MEDICATED_PAD | CUTANEOUS | Status: DC | PRN

## 2019-01-13 MED ORDER — COCONUT OIL OIL: 1.0000 "application " | TOPICAL_OIL | Status: DC | PRN

## 2019-01-13 MED ORDER — DIPHENHYDRAMINE HCL 25 MG PO CAPS: 25.0000 mg | ORAL_CAPSULE | Freq: Four times a day (QID) | ORAL | Status: DC | PRN

## 2019-01-13 MED ORDER — BENZOCAINE-MENTHOL 20-0.5 % EX AERO: 1.0000 "application " | INHALATION_SPRAY | CUTANEOUS | Status: DC | PRN

## 2019-01-13 MED ORDER — LACTATED RINGERS IV SOLN: 500.0000 mL | INTRAVENOUS | Status: DC | PRN

## 2019-01-13 MED ORDER — ONDANSETRON HCL 4 MG/2ML IJ SOLN: 4.0000 mg | Freq: Four times a day (QID) | INTRAMUSCULAR | Status: DC | PRN

## 2019-01-13 MED ORDER — LACTATED RINGERS IV SOLN: INTRAVENOUS | Status: DC

## 2019-01-13 MED ORDER — OXYTOCIN 40 UNITS IN NORMAL SALINE INFUSION - SIMPLE MED
2.5000 [IU]/h | INTRAVENOUS | Status: DC
  Filled 2019-01-13: qty 1000

## 2019-01-13 MED ORDER — OXYTOCIN BOLUS FROM INFUSION
500.0000 mL | Freq: Once | INTRAVENOUS | Status: AC
  Administered 2019-01-13: 18:00:00 500 mL via INTRAVENOUS

## 2019-01-13 MED ORDER — DIBUCAINE (PERIANAL) 1 % EX OINT: 1.0000 "application " | TOPICAL_OINTMENT | CUTANEOUS | Status: DC | PRN

## 2019-01-13 MED ORDER — SOD CITRATE-CITRIC ACID 500-334 MG/5ML PO SOLN: 30.0000 mL | ORAL | Status: DC | PRN

## 2019-01-13 MED ORDER — SIMETHICONE 80 MG PO CHEW: 80.0000 mg | CHEWABLE_TABLET | ORAL | Status: DC | PRN

## 2019-01-13 MED ORDER — IBUPROFEN 600 MG PO TABS
600.0000 mg | ORAL_TABLET | Freq: Four times a day (QID) | ORAL | Status: DC
  Administered 2019-01-13 – 2019-01-14 (×4): 600 mg via ORAL
  Filled 2019-01-13 (×4): qty 1

## 2019-01-13 MED ORDER — MISOPROSTOL 50MCG HALF TABLET
50.0000 ug | ORAL_TABLET | ORAL | Status: DC | PRN
  Administered 2019-01-13: 50 ug via ORAL
  Filled 2019-01-13: qty 1

## 2019-01-13 MED ORDER — ZOLPIDEM TARTRATE 5 MG PO TABS: 5.0000 mg | ORAL_TABLET | Freq: Every evening | ORAL | Status: DC | PRN

## 2019-01-13 MED ORDER — ONDANSETRON HCL 4 MG/2ML IJ SOLN: 4.0000 mg | INTRAMUSCULAR | Status: DC | PRN

## 2019-01-13 MED ORDER — ACETAMINOPHEN 325 MG PO TABS
650.0000 mg | ORAL_TABLET | ORAL | Status: DC | PRN
  Administered 2019-01-14: 05:00:00 650 mg via ORAL
  Filled 2019-01-13: qty 2

## 2019-01-13 MED ORDER — OXYCODONE HCL 5 MG PO TABS: 10.0000 mg | ORAL_TABLET | ORAL | Status: DC | PRN

## 2019-01-13 MED ORDER — SENNOSIDES-DOCUSATE SODIUM 8.6-50 MG PO TABS
2.0000 | ORAL_TABLET | ORAL | Status: DC
  Administered 2019-01-13: 2 via ORAL
  Filled 2019-01-13: qty 2

## 2019-01-13 MED ORDER — PRENATAL MULTIVITAMIN CH
1.0000 | ORAL_TABLET | Freq: Every day | ORAL | Status: DC
  Administered 2019-01-14: 12:00:00 1 via ORAL
  Filled 2019-01-13: qty 1

## 2019-01-13 MED ORDER — OXYTOCIN 40 UNITS IN NORMAL SALINE INFUSION - SIMPLE MED
1.0000 m[IU]/min | INTRAVENOUS | Status: DC
  Administered 2019-01-13: 13:00:00 2 m[IU]/min via INTRAVENOUS

## 2019-01-13 MED ORDER — OXYCODONE HCL 5 MG PO TABS: 5.0000 mg | ORAL_TABLET | ORAL | Status: DC | PRN

## 2019-01-13 MED ORDER — TETANUS-DIPHTH-ACELL PERTUSSIS 5-2.5-18.5 LF-MCG/0.5 IM SUSP: 0.5000 mL | Freq: Once | INTRAMUSCULAR | Status: DC

## 2019-01-13 MED ORDER — ACETAMINOPHEN 325 MG PO TABS: 650.0000 mg | ORAL_TABLET | ORAL | Status: DC | PRN

## 2019-01-13 MED ORDER — OXYCODONE-ACETAMINOPHEN 5-325 MG PO TABS: 2.0000 | ORAL_TABLET | ORAL | Status: DC | PRN

## 2019-01-13 MED ORDER — OXYCODONE-ACETAMINOPHEN 5-325 MG PO TABS: 1.0000 | ORAL_TABLET | ORAL | Status: DC | PRN

## 2019-01-13 NOTE — Progress Notes (Signed)
Patient ID: Sheila Kirby, female   DOB: June 21, 1995, 24 y.o.   MRN: [id number]  On pit. Doing well.   BP 127/82   Pulse 71   Temp 98.7 F (37.1 C) (Oral)   Resp 14   Ht 5\' 8"  (1.727 m)   Wt 99.2 kg   LMP 04/12/2018   BMI 33.24 kg/m   Dilation: 5 Effacement (%): 80 Station: -1, 0 Presentation: Vertex Exam by:: Dr. [email or web address]  Cat 1 tracing Arom, copious clear fluid. Expect vaginal delivery  Adrian Blackwater, DO 01/13/2019 2:40 PM

## 2019-01-13 NOTE — H&P (Signed)
Sheila Kirby is a 24 y.o. female presenting for elective induction for favorable cervix in a mulitpurous patient. Dating by 9 week [place], consistent with LMP. OB History    Gravida  3   Para  2   Term  2   Preterm      AB      Living  2     SAB      TAB      Ectopic      Multiple  0   Live Births  2          Past Medical History:  Diagnosis Date  . Medical history non-contributory   . Sore throat 12/24/2018  . Symptoms of upper respiratory infection (URI) 12/24/2018   Past Surgical History:  Procedure Laterality Date  . NO PAST SURGERIES    . WISDOM TOOTH EXTRACTION     Family History: family history includes Cancer in her maternal grandfather, paternal grandfather, and sister; Diabetes in her father; Heart attack in her maternal aunt, maternal aunt, maternal aunt, and paternal grandmother; Hypertension in her father and mother; Spina bifida in her paternal uncle; Thyroid disease in her mother. Social History:  reports that she has never smoked. She has never used smokeless tobacco. She reports that she does not drink alcohol or use drugs.     Maternal Diabetes: No Genetic Screening: Normal Maternal Ultrasounds/Referrals: Normal Fetal Ultrasounds or other Referrals:  None Maternal Substance Abuse:  No Significant Maternal Medications:  None Significant Maternal Lab Results:  Group B Strep negative Other Comments:  None  Review of Systems History   Last menstrual period 04/12/2018, currently breastfeeding. Maternal Exam:  Uterine Assessment: Contraction strength is mild.  Contraction frequency is regular.   Abdomen: Patient reports no abdominal tenderness. Fundal height is 39.   Estimated fetal weight is 8.   Fetal presentation: vertex  Introitus: Normal vulva. Normal vagina.  Ferning test: not done.  Nitrazine test: not done. Amniotic fluid character: not assessed.  Pelvis: adequate for delivery.   Cervix: Cervix evaluated by digital exam.      Fetal Exam Fetal Monitor Review: Mode: hand-held doppler probe.   Baseline rate: 120.  Variability: moderate (6-25 bpm).   Pattern: accelerations present.    Fetal State Assessment: Category I - tracings are normal.     Physical Exam  Constitutional: She appears well-developed and well-nourished.  Cardiovascular: Normal rate.  Respiratory: Effort normal.  Genitourinary:    Vulva normal.   Skin: Skin is warm and dry.  Psychiatric: She has a normal mood and affect. Her behavior is normal. Judgment and thought content normal.    Prenatal labs: ABO, Rh: O/Positive/-- (06/15 1024) Antibody: Negative (06/15 1024) Rubella: 3.43 (06/15 1024) RPR: Non Reactive (10/29 0951)  HBsAg: Negative (06/15 1024)  HIV: Non Reactive (10/29 0951)  GBS: Negative/-- (12/21 0263)   Assessment/Plan: Induction - patient request cytotec dose first. Will give buccaly.  Breastfeeding IUD for contraception  Anticipate vaginal delivery Cat 1 tracing  [name] 01/13/2019, 8:42 AM

## 2019-01-13 NOTE — Progress Notes (Signed)
Sheila Kirby is a 24 y.o. G3P2002 at [redacted]w[redacted]d by ultrasound admitted for induction of labor due to Elective at term.  Subjective: Doing well.  Sitting in chair, resting.  Feeling some contractions, but nothing consistent.  No complaints.    Objective: BP 127/75   Pulse 80   Temp 98.7 F (37.1 C) (Oral)   Resp 12   Ht 5\' 8"  (1.727 m)   Wt 99.2 kg   LMP 04/12/2018   BMI 33.24 kg/m  No intake/output data recorded. No intake/output data recorded.  FHT:  FHR: 125 bpm, variability: moderate,  accelerations:  Present,  decelerations:  Absent UC:   irregular, every 2-4 minutes SVE:   Dilation: 4 Effacement (%): 50 Exam by:: Dr. [email or web address]  Labs: Lab Results  Component Value Date   WBC 7.3 01/13/2019   HGB 11.9 (L) 01/13/2019   HCT 35.4 (L) 01/13/2019   MCV 87.2 01/13/2019   PLT 188 01/13/2019    Assessment / Plan: Elective IOL   -Discuss with Dr. 03/13/2019 method in which would like to proceed with induction.  Labor: Latent Fetal Wellbeing:  Category I Pain Control:  Epidural and IV pain meds, PRN I/D:  n/a Anticipated MOD:  NSVD  Adrian Blackwater, SNM 01/13/2019, 12:55 PM

## 2019-01-13 NOTE — Discharge Summary (Signed)
Postpartum Discharge Summary    Patient Name: Sheila Kirby DOB: 1995/09/27 MRN: [id number]  Date of admission: 01/13/2019 Delivering Provider: Truett Mainland   Date of discharge: 01/14/2019  Admitting diagnosis: Encounter for induction of labor [Z34.90] Intrauterine pregnancy: [redacted]w[redacted]d    Secondary diagnosis:  Active Problems:   Supervision of other normal pregnancy, antepartum   Short interval between pregnancies affecting pregnancy, antepartum   Encounter for induction of labor  Additional problems: none     Discharge diagnosis: Term Pregnancy Delivered                                                                                                Post partum procedures:none  Augmentation: AROM, Pitocin and Cytotec  Complications: None  Hospital course:  Induction of Labor With Vaginal Delivery   24y.o. yo G3P2002 at 339w3das admitted to the hospital 01/13/2019 for induction of labor.  Indication for induction: Favorable cervix at term.  Patient had an uncomplicated labor course as follows: misoprostol x1 followed by pitocin and AROM and then precipitous delivery.  Membrane Rupture Time/Date: 2:29 PM ,01/13/2019   Intrapartum Procedures: Episiotomy: None [1]                                         Lacerations:  None [1]  Patient had delivery of a Viable infant.  Information for the patient's newborn:  ShKyren, Vaux0[[id number]]Delivery Method: Vaginal, Spontaneous(Filed from Delivery Summary)    01/13/2019  Details of delivery can be found in separate delivery note.  Patient had a routine postpartum course. Patient is discharged home 01/14/19. Delivery time: 5:25 PM    Magnesium Sulfate received: No BMZ received: No Rhophylac:N/A MMR:N/A Transfusion:No  Physical exam  Vitals:   01/13/19 1904 01/13/19 2012 01/13/19 2314 01/14/19 0457  BP: 128/83 117/66 119/68 124/84  Pulse: 68 75 72 74  Resp: '15 16 15 14  ' Temp: 98.5 F (36.9 C) 98.4 F (36.9 C) 97.7 F  (36.5 C) 98.8 F (37.1 C)  TempSrc: Oral Oral Oral Oral  SpO2: 100% 100% 100% 100%  Weight:      Height:       General: alert, cooperative and no distress Lochia: appropriate Uterine Fundus: firm Incision: N/A DVT Evaluation: No evidence of DVT seen on physical exam. No significant calf/ankle edema. Labs: Lab Results  Component Value Date   WBC 7.3 01/13/2019   HGB 11.9 (L) 01/13/2019   HCT 35.4 (L) 01/13/2019   MCV 87.2 01/13/2019   PLT 188 01/13/2019   CMP Latest Ref Rng & Units 12/12/2018  Glucose 65 - 99 mg/dL 83  BUN 6 - 20 mg/dL 6  Creatinine 0.57 - 1.00 mg/dL 0.58  Sodium 134 - 144 mmol/L 137  Potassium 3.5 - 5.2 mmol/L 4.3  Chloride 96 - 106 mmol/L 103  CO2 20 - 29 mmol/L 21  Calcium 8.7 - 10.2 mg/dL 8.9  Total Protein 6.0 - 8.5 g/dL -  Total Bilirubin 0.0 - 1.2 mg/dL -  Alkaline Phos 39 - 117 IU/L -  AST 0 - 40 IU/L -  ALT 0 - 32 IU/L -    Discharge instruction: per After Visit Summary and "Baby and Me Booklet".  After visit meds:  Allergies as of 01/14/2019   No Known Allergies     Medication List    TAKE these medications   acetaminophen 325 MG tablet Commonly known as: Tylenol Take 2 tablets (650 mg total) by mouth every 4 (four) hours as needed (for pain scale < 4).   ibuprofen 600 MG tablet Commonly known as: ADVIL Take 1 tablet (600 mg total) by mouth every 6 (six) hours.   polyethylene glycol powder 17 GM/SCOOP powder Commonly known as: GLYCOLAX/MIRALAX Take 255 g by mouth once for 1 dose.   prenatal multivitamin Tabs tablet Take 1 tablet by mouth daily at 12 noon.       Diet: routine diet  Activity: Advance as tolerated. Pelvic rest for 6 weeks.   Outpatient follow up:4 weeks Follow up Appt: Future Appointments  Date Time Provider [place]  02/13/2019  9:00 AM Truett Mainland, DO [facility] None  03/18/2019  3:55 PM Revankar, Reita Cliche, MD [facility] None    Newborn Data: Live born female  Birth Weight: 8 lb 6.6 oz  (3815 g) APGAR: 9, 9  Newborn Delivery   Birth date/time: 01/13/2019 17:25:00 Delivery type: Vaginal, Spontaneous      Baby Feeding: Breast Disposition:home with mother   01/14/2019 Clarnce Flock, MD

## 2019-01-14 LAB — CBC
HCT: 32.8 % — ABNORMAL LOW (ref 36.0–46.0)
Hemoglobin: 11 g/dL — ABNORMAL LOW (ref 12.0–15.0)
MCH: 28.8 pg (ref 26.0–34.0)
MCHC: 33.5 g/dL (ref 30.0–36.0)
MCV: 85.9 fL (ref 80.0–100.0)
Platelets: 187 10*3/uL (ref 150–400)
RBC: 3.82 MIL/uL — ABNORMAL LOW (ref 3.87–5.11)
RDW: 12.2 % (ref 11.5–15.5)
WBC: 11.2 10*3/uL — ABNORMAL HIGH (ref 4.0–10.5)
nRBC: 0 % (ref 0.0–0.2)

## 2019-01-14 MED ORDER — IBUPROFEN 600 MG PO TABS: 600.0000 mg | ORAL_TABLET | Freq: Four times a day (QID) | ORAL | 0 refills | Status: DC

## 2019-01-14 MED ORDER — POLYETHYLENE GLYCOL 3350 17 GM/SCOOP PO POWD: 1.0000 | Freq: Once | ORAL | 0 refills | Status: AC

## 2019-01-14 MED ORDER — ACETAMINOPHEN 325 MG PO TABS: 650.0000 mg | ORAL_TABLET | ORAL | 0 refills | Status: DC | PRN

## 2019-01-14 MED FILL — IBUPROFEN 600 MG TABLET: 600 | 7 days supply | Qty: 30 | Fill #0

## 2019-01-14 NOTE — Discharge Instructions (Signed)

## 2019-01-14 NOTE — Lactation Note (Signed)
This note was copied from a baby's chart. Lactation Consultation Note Attempted to see mom, everyone sleeping.  Patient Name: Sheila Kirby [id number] Date: 01/14/2019     Maternal Data    Feeding Feeding Type: Breast Fed  LATCH Score                   Interventions    Lactation Tools Discussed/Used     Consult Status      Charyl Dancer 01/14/2019, 2:51 AM

## 2019-01-14 NOTE — Lactation Note (Signed)
This note was copied from a baby's chart. Lactation Consultation Note  Patient Name: Sheila Kirby [id number] Date: 01/14/2019 Reason for consult: Initial assessment;Term;Infant weight loss Baby is 19 hours  Baby Is post circ and awake after Dr.'s exam.  Mom and dad requesting early D/C later today.  LC offered to assist to latch. LC noted baby has a significant recessed  Chin. Per mom the football position working well.  Baby unable to latch, sluggish, LC had mom hand express 8 ML and  Mom spoon fed baby / tolerated well.  Baby more awake and latched with depth and fed for 10  mins / short break and  relatched and still feeding for 2nd latch. Multiple swallows noted/ increased with compressions and baby able to handle the quick let down.  LC discussed nutritive vs non- nutritive feeding patterns and the importance of watching the baby for hanging out latched.  Mom denies sore nipples . Sore nipple and engorgement prevention and tx reviewed.  Per mom has a HAKKA hand pump and a DEBP at home.  Storage of breast milk reviewed.  LC provided the [facility] pamphlet with phone numbers and mom aware of the [facility] [email or web address]  Mom and dad receptive to breast feeding teaching and expressed appreciation for BF assistance.   Maternal Data Does the patient have breastfeeding experience prior to this delivery?: Yes  Feeding Feeding Type: Breast Fed  LATCH Score Latch: Repeated attempts needed to sustain latch, nipple held in mouth throughout feeding, stimulation needed to elicit sucking reflex.  Audible Swallowing: Spontaneous and intermittent  Type of Nipple: Everted at rest and after stimulation  Comfort (Breast/Nipple): Soft / non-tender  Hold (Positioning): Assistance needed to correctly position infant at breast and maintain latch.  LATCH Score: 8  Interventions Interventions: Breast feeding basics reviewed;Assisted with latch;Skin to skin;Breast massage;Breast compression;Adjust  position;Support pillows;Position options  Lactation Tools Discussed/Used WIC Program: No Pump Review: Milk Storage   Consult Status Consult Status: Complete Date: 01/14/19 Follow-up type: In-patient    Matilde Sprang Donnell Wion 01/14/2019, 12:30 PM

## 2019-01-19 ENCOUNTER — Encounter (HOSPITAL_COMMUNITY): Payer: Self-pay | Admitting: Obstetrics and Gynecology

## 2019-01-19 ENCOUNTER — Inpatient Hospital Stay (HOSPITAL_COMMUNITY)
Admission: AD | Admit: 2019-01-19 | Discharge: 2019-01-20 | Disposition: A | Discharge status: Home / Self Care | Payer: 59 | Source: Ambulatory Visit | Attending: Obstetrics and Gynecology | Admitting: Obstetrics and Gynecology

## 2019-01-19 ENCOUNTER — Other Ambulatory Visit: Payer: Self-pay

## 2019-01-19 DIAGNOSIS — Z808 Family history of malignant neoplasm of other organs or systems: Secondary | ICD-10-CM | POA: Insufficient documentation

## 2019-01-19 DIAGNOSIS — Z791 Long term (current) use of non-steroidal anti-inflammatories (NSAID): Secondary | ICD-10-CM | POA: Insufficient documentation

## 2019-01-19 DIAGNOSIS — O8612 Endometritis following delivery: Secondary | ICD-10-CM | POA: Insufficient documentation

## 2019-01-19 DIAGNOSIS — R109 Unspecified abdominal pain: Secondary | ICD-10-CM

## 2019-01-19 DIAGNOSIS — Z8051 Family history of malignant neoplasm of kidney: Secondary | ICD-10-CM | POA: Insufficient documentation

## 2019-01-19 DIAGNOSIS — Z833 Family history of diabetes mellitus: Secondary | ICD-10-CM | POA: Insufficient documentation

## 2019-01-19 NOTE — MAU Note (Addendum)
Sheila Kirby on 01/13/19.  Patient states until yesterday her bleeding had slowed down and was mixed with mucous.  Starting today she began having heavier bleeding, abdominal pain, lower back pain, and passing quarter sized clots.  States she took her temp today because she became flushed and felt febrile- 99.8 at 2030, 100.8 at 2130.  Took Ibuprofen at 1500 because that was when she started feeling feverish.  Patient states yesterday she used 3 pads throughout the day, today she used 5.

## 2019-01-20 ENCOUNTER — Inpatient Hospital Stay (HOSPITAL_COMMUNITY): Payer: 59

## 2019-01-20 DIAGNOSIS — Z808 Family history of malignant neoplasm of other organs or systems: Secondary | ICD-10-CM | POA: Diagnosis not present

## 2019-01-20 DIAGNOSIS — O8612 Endometritis following delivery: Secondary | ICD-10-CM | POA: Diagnosis not present

## 2019-01-20 DIAGNOSIS — Z8051 Family history of malignant neoplasm of kidney: Secondary | ICD-10-CM | POA: Diagnosis not present

## 2019-01-20 DIAGNOSIS — R58 Hemorrhage, not elsewhere classified: Secondary | ICD-10-CM | POA: Diagnosis not present

## 2019-01-20 DIAGNOSIS — N939 Abnormal uterine and vaginal bleeding, unspecified: Secondary | ICD-10-CM | POA: Diagnosis not present

## 2019-01-20 DIAGNOSIS — Z791 Long term (current) use of non-steroidal anti-inflammatories (NSAID): Secondary | ICD-10-CM | POA: Diagnosis not present

## 2019-01-20 DIAGNOSIS — Z833 Family history of diabetes mellitus: Secondary | ICD-10-CM | POA: Diagnosis not present

## 2019-01-20 LAB — CBC WITH DIFFERENTIAL/PLATELET
Abs Immature Granulocytes: 0.03 10*3/uL (ref 0.00–0.07)
Basophils Absolute: 0 10*3/uL (ref 0.0–0.1)
Basophils Relative: 0 %
Eosinophils Absolute: 0.1 10*3/uL (ref 0.0–0.5)
Eosinophils Relative: 1 %
HCT: 34.1 % — ABNORMAL LOW (ref 36.0–46.0)
Hemoglobin: 11.5 g/dL — ABNORMAL LOW (ref 12.0–15.0)
Immature Granulocytes: 0 %
Lymphocytes Relative: 11 %
Lymphs Abs: 1.1 10*3/uL (ref 0.7–4.0)
MCH: 28.9 pg (ref 26.0–34.0)
MCHC: 33.7 g/dL (ref 30.0–36.0)
MCV: 85.7 fL (ref 80.0–100.0)
Monocytes Absolute: 0.5 10*3/uL (ref 0.1–1.0)
Monocytes Relative: 5 %
Neutro Abs: 7.7 10*3/uL (ref 1.7–7.7)
Neutrophils Relative %: 83 %
Platelets: 256 10*3/uL (ref 150–400)
RBC: 3.98 MIL/uL (ref 3.87–5.11)
RDW: 12 % (ref 11.5–15.5)
WBC: 9.4 10*3/uL (ref 4.0–10.5)
nRBC: 0 % (ref 0.0–0.2)

## 2019-01-20 LAB — COMPREHENSIVE METABOLIC PANEL
ALT: 29 U/L (ref 0–44)
AST: 20 U/L (ref 15–41)
Albumin: 3 g/dL — ABNORMAL LOW (ref 3.5–5.0)
Alkaline Phosphatase: 155 U/L — ABNORMAL HIGH (ref 38–126)
Anion gap: 10 (ref 5–15)
BUN: 7 mg/dL (ref 6–20)
CO2: 22 mmol/L (ref 22–32)
Calcium: 8.7 mg/dL — ABNORMAL LOW (ref 8.9–10.3)
Chloride: 108 mmol/L (ref 98–111)
Creatinine, Ser: 0.63 mg/dL (ref 0.44–1.00)
GFR calc Af Amer: >60 mL/min (ref >60)
GFR calc non Af Amer: >60 mL/min (ref >60)
Glucose, Bld: 110 mg/dL — ABNORMAL HIGH (ref 70–99)
Potassium: 3.6 mmol/L (ref 3.5–5.1)
Sodium: 140 mmol/L (ref 135–145)
Total Bilirubin: 0.4 mg/dL (ref 0.3–1.2)
Total Protein: 6.3 g/dL — ABNORMAL LOW (ref 6.5–8.1)

## 2019-01-20 LAB — URINALYSIS, ROUTINE W REFLEX MICROSCOPIC
Bacteria, UA: NONE SEEN
Bilirubin Urine: NEGATIVE
Glucose, UA: NEGATIVE mg/dL
Ketones, ur: NEGATIVE mg/dL
Nitrite: NEGATIVE
Protein, ur: NEGATIVE mg/dL
RBC / HPF: >50 RBC/hpf — ABNORMAL HIGH (ref 0–5)
Specific Gravity, Urine: 1.017 (ref 1.005–1.030)
pH: 6 (ref 5.0–8.0)

## 2019-01-20 LAB — LACTIC ACID, PLASMA: Lactic Acid, Venous: 0.8 mmol/L (ref 0.5–1.9)

## 2019-01-20 MED ORDER — AMOXICILLIN-POT CLAVULANATE 875-125 MG PO TABS
1.0000 | ORAL_TABLET | Freq: Once | ORAL | Status: AC
  Administered 2019-01-20: 1 via ORAL
  Filled 2019-01-20: qty 1

## 2019-01-20 MED ORDER — ACETAMINOPHEN 325 MG PO TABS
650.0000 mg | ORAL_TABLET | Freq: Once | ORAL | Status: AC
  Administered 2019-01-20: 01:00:00 650 mg via ORAL
  Filled 2019-01-20: qty 2

## 2019-01-20 MED ORDER — AMOXICILLIN-POT CLAVULANATE 875-125 MG PO TABS: 1.0000 | ORAL_TABLET | Freq: Two times a day (BID) | ORAL | 0 refills | Status: AC

## 2019-01-20 MED FILL — AMOX-CLAV 875-125 MG TABLET: 875-125 | 14 days supply | Qty: 28 | Fill #0

## 2019-01-20 NOTE — Discharge Instructions (Signed)
Endometritis  Endometritis is irritation, soreness, or inflammation that affects the lining of the uterus (endometrium). Infection is usually the cause of endometritis. It is important to get treatment to prevent complications. Common complications may include more severe infections and not being able to have children(infertility). What are the causes? This condition may be caused by:  Bacterial infections.  STIs (sexually transmitted infections).  A miscarriage or childbirth, especially after a long labor or cesarean delivery.  Certain gynecological procedures. These may include dilation and curettage (D&C), hysteroscopy, or birth control (contraceptive) insertion.  Tuberculosis (TB). What are the signs or symptoms? Symptoms of this condition include:  Fever.  Lower abdomen (abdominal) pain.  Pelvis (pelvic) pain.  Abnormal vaginal discharge or bleeding.  Abdominal bloating (distention) or swelling.  General discomfort or generally feeling ill.  Discomfort with bowel movements.  Constipation. How is this diagnosed? This condition may be diagnosed based on:  A physical exam, including a pelvic exam.  Tests, such as: ? Blood tests. ? Removal of a sample of endometrial tissue for testing (endometrial biopsy). ? Examining a sample of vaginal discharge under a microscope (wet prep). ? Removal of a sample of fluid from the cervix for testing (cervical culture). ? Surgical examination of the pelvis and abdomen. How is this treated? This condition is treated with:  Antibiotic medicines.  For more severe cases, hospitalization may be needed to give fluids and antibiotics directly into a vein through an IV tube. Follow these instructions at home:  Take over-the-counter and prescription medicines only as told by your health care provider.  Drink enough fluid to keep your urine clear or pale yellow.  Take your antibiotic medicine as told by your health care provider. Do  not stop taking the antibiotic even if you start to feel better.  Do not douche or have sex (including vaginal, oral, and anal sex) until your health care provider approves.  If your endometritis was caused by an STI, do not have sex (including vaginal, oral, and anal sex) until your partner has also been treated for the STI.  Return to your normal activities as told by your health care provider. Ask your health care provider what activities are safe for you.  Keep all follow-up visits as told by your health care provider. This is important. Contact a health care provider if:  You have pain that does not get better with medicine.  You have a fever.  You have pain with bowel movements. Get help right away if:  You have abdominal swelling.  You have abdominal pain that gets worse.  You have bad-smelling vaginal discharge, or an increased amount of vaginal discharge.  You have abnormal vaginal bleeding.  You have nausea and vomiting. Summary  Endometritis affects the lining of the uterus (endometrium) and is usually caused by an infection.  It is important to get treatment to prevent complications.  You have several treatment options for endometritis. Treatment may include antibiotics and IV fluids.  Take your antibiotic medicine as told by your health care provider. Do not stop taking the antibiotic even if you start to feel better.  Do not douche or have sex (including vaginal, oral, and anal sex) until your health care provider approves. This information is not intended to replace advice given to you by your health care provider. Make sure you discuss any questions you have with your health care provider. Document Revised: 11/30/2016 Document Reviewed: 01/03/2016 [employer] Patient Education  2020 [employer]  

## 2019-01-20 NOTE — MAU Provider Note (Signed)
History     CSN: [id number]  Arrival date and time: 01/19/19 2334   First Provider Initiated Contact with Patient 01/20/19 0028      Chief Complaint  Patient presents with  . Fever  . Vaginal Bleeding   Sheila Kirby is a 24 y.o. G3P3 who is 7 days PP from a SVD on 1/12. She reports not feeling well or herself yesterday and today. This morning started feeling fatigue as if she did not get enough rest but reports sleeping through the night. She reports starting today having increased abdominal pain and vaginal bleeding. Describes the abdominal pain as lower abdominal pain that goes from "hip to hip", describes as a dull aching pain, rates 3-4/10. Reports mid flank back pain is associated with abdominal pain that she also rates 3-4/10. Patient states until yesterday her bleeding had slowed down and was mixed with mucous.  Starting today she began having heavier bleeding and passing quarter sized clots. She reports using 5 pads today, describes bleeding as it is either nothing or bleeding with clots.   States she took her temp today because she became flushed, felt febrile and cheeks were rosy- temp 99.8 at 2030, 100.8 at 2130.  Took Ibuprofen at 1500 prior to taking initial temp because that was when she started feeling flushed. She reports HA that started around 2000 this evening- describes as dull frontal HA, rates 3/10. Has not taken any medication since ibuprofen.   She denies complications during her pregnancy and delivery- had precipitous delivery in bathroom after initiation of induction. She denies being around any sick contacts. Husband works as Scientist, clinical (histocompatibility and immunogenetics). Denies sore throat, cough, SOB, body aches or chills. Patient reports she is breastfeeding.    OB History    Gravida  3   Para  3   Term  3   Preterm      AB      Living  3     SAB      TAB      Ectopic      Multiple  0   Live Births  3           Past Medical History:  Diagnosis Date  .  Medical history non-contributory   . Sore throat 12/24/2018  . Symptoms of upper respiratory infection (URI) 12/24/2018    Past Surgical History:  Procedure Laterality Date  . NO PAST SURGERIES    . WISDOM TOOTH EXTRACTION      Family History  Problem Relation Age of Onset  . Hypertension Mother   . Thyroid disease Mother   . Diabetes Father   . Hypertension Father   . Cancer Sister        skin cancer  . Cancer Maternal Grandfather        skin  . Cancer Paternal Grandfather        renal  . Spina bifida Paternal Uncle   . Heart attack Paternal Grandmother   . Heart attack Maternal Aunt   . Heart attack Maternal Aunt   . Heart attack Maternal Aunt   . Stroke Neg Hx   . Miscarriages / Stillbirths Neg Hx   . Mental retardation Neg Hx     Social History   Tobacco Use  . Smoking status: Never Smoker  . Smokeless tobacco: Never Used  Substance Use Topics  . Alcohol use: No  . Drug use: No    Allergies: No Known Allergies  Medications Prior to Admission  Medication  Sig Dispense Refill Last Dose  . acetaminophen (TYLENOL) 325 MG tablet Take 2 tablets (650 mg total) by mouth every 4 (four) hours as needed (for pain scale < 4). 30 tablet 0   . ibuprofen (ADVIL) 600 MG tablet Take 1 tablet (600 mg total) by mouth every 6 (six) hours. 30 tablet 0   . Prenatal Vit-Fe Fumarate-FA (PRENATAL MULTIVITAMIN) TABS tablet Take 1 tablet by mouth daily at 12 noon.       Review of Systems  Constitutional: Positive for fatigue and fever. Negative for chills.  Respiratory: Negative.   Cardiovascular: Negative.   Gastrointestinal: Positive for abdominal pain. Negative for constipation, diarrhea, nausea and vomiting.  Genitourinary: Positive for vaginal bleeding. Negative for difficulty urinating, dysuria, frequency, pelvic pain and urgency.  Musculoskeletal: Positive for back pain. Negative for neck pain.  Neurological: Positive for headaches. Negative for dizziness, syncope and  light-headedness.   Physical Exam   Blood pressure 128/72, pulse (!) 108, temperature (!) 100.6 F (38.1 C), temperature source Oral, resp. rate 19, weight 93.6 kg, last menstrual period 04/12/2018, SpO2 98 %, unknown if currently breastfeeding.  Physical Exam  Constitutional: She is oriented to person, place, and time. She appears well-developed and well-nourished. No distress.  Flushed red rosy cheeks  Cardiovascular: Normal rate and regular rhythm.  Respiratory: Effort normal and breath sounds normal. No respiratory distress. She has no wheezes.  GI: Soft. She exhibits no distension. There is abdominal tenderness. There is no rebound and no guarding.  Fundal uterine tenderness with palpation. U/4 - appropriate after delivery   Musculoskeletal:        General: No edema. Normal range of motion.  Neurological: She is alert and oriented to person, place, and time. She displays normal reflexes. She exhibits normal muscle tone.  Skin: She is diaphoretic.  Psychiatric: She has a normal mood and affect. Her behavior is normal. Thought content normal.    MAU Course  Procedures  MDM Orders Placed This Encounter  Procedures  . Culture, blood (routine x 2)  . Culture, blood (routine x 2)  . Culture, OB Urine  . US Pelvis Complete  . US RENAL  . Urinalysis, Routine w reflex microscopic  . CBC with Differential/Platelet  . Comprehensive metabolic panel  . Lactic acid, plasma   Patient initially febrile and pulse of 108 upon arrival to MAU - concerned for possible sepsis, labs ordered  US ordered to r/o retained POC, renal US ordered d/t CVA tenderness  650mg  Tylenol given in MAU   Labs and report reviewed:  Results for orders placed or performed during the hospital encounter of 01/19/19 (from the past 24 hour(s))  CBC with Differential/Platelet     Status: Abnormal   Collection Time: 01/20/19 12:24 AM  Result Value Ref Range   WBC 9.4 4.0 - 10.5 K/uL   RBC 3.98 3.87 - 5.11  MIL/uL   Hemoglobin 11.5 (L) 12.0 - 15.0 g/dL   HCT 01/22/19 (L) 63.0 - 16.0 %   MCV 85.7 80.0 - 100.0 fL   MCH 28.9 26.0 - 34.0 pg   MCHC 33.7 30.0 - 36.0 g/dL   RDW 10.9 32.3 - 55.7 %   Platelets 256 150 - 400 K/uL   nRBC 0.0 0.0 - 0.2 %   Neutrophils Relative % 83 %   Neutro Abs 7.7 1.7 - 7.7 K/uL   Lymphocytes Relative 11 %   Lymphs Abs 1.1 0.7 - 4.0 K/uL   Monocytes Relative 5 %  Monocytes Absolute 0.5 0.1 - 1.0 K/uL   Eosinophils Relative 1 %   Eosinophils Absolute 0.1 0.0 - 0.5 K/uL   Basophils Relative 0 %   Basophils Absolute 0.0 0.0 - 0.1 K/uL   Immature Granulocytes 0 %   Abs Immature Granulocytes 0.03 0.00 - 0.07 K/uL  Comprehensive metabolic panel     Status: Abnormal   Collection Time: 01/20/19 12:24 AM  Result Value Ref Range   Sodium 140 135 - 145 mmol/L   Potassium 3.6 3.5 - 5.1 mmol/L   Chloride 108 98 - 111 mmol/L   CO2 22 22 - 32 mmol/L   Glucose, Bld 110 (H) 70 - 99 mg/dL   BUN 7 6 - 20 mg/dL   Creatinine, Ser 5.46 0.44 - 1.00 mg/dL   Calcium 8.7 (L) 8.9 - 10.3 mg/dL   Total Protein 6.3 (L) 6.5 - 8.1 g/dL   Albumin 3.0 (L) 3.5 - 5.0 g/dL   AST 20 15 - 41 U/L   ALT 29 0 - 44 U/L   Alkaline Phosphatase 155 (H) 38 - 126 U/L   Total Bilirubin 0.4 0.3 - 1.2 mg/dL   GFR calc non Af Amer >60 >60 mL/min   GFR calc Af Amer >60 >60 mL/min   Anion gap 10 5 - 15  Lactic acid, plasma     Status: None   Collection Time: 01/20/19 12:24 AM  Result Value Ref Range   Lactic Acid, Venous 0.8 0.5 - 1.9 mmol/L  Urinalysis, Routine w reflex microscopic     Status: Abnormal   Collection Time: 01/20/19  2:17 AM  Result Value Ref Range   Color, Urine YELLOW YELLOW   APPearance CLEAR CLEAR   Specific Gravity, Urine 1.017 1.005 - 1.030   pH 6.0 5.0 - 8.0   Glucose, UA NEGATIVE NEGATIVE mg/dL   Hgb urine dipstick LARGE (A) NEGATIVE   Bilirubin Urine NEGATIVE NEGATIVE   Ketones, ur NEGATIVE NEGATIVE mg/dL   Protein, ur NEGATIVE NEGATIVE mg/dL   Nitrite NEGATIVE NEGATIVE    Leukocytes,Ua TRACE (A) NEGATIVE   RBC / HPF >50 (H) 0 - 5 RBC/hpf   WBC, UA 6-10 0 - 5 WBC/hpf   Bacteria, UA NONE SEEN NONE SEEN   Squamous Epithelial / LPF 0-5 0 - 5   Mucus PRESENT    US Pelvis Complete  Result Date: 01/20/2019 CLINICAL DATA:  Heavy vaginal bleeding. Abdominal and lower back pain. Fever. Vaginal delivery 1 week prior. EXAM: TRANSABDOMINAL ULTRASOUND OF PELVIS TECHNIQUE: Transabdominal ultrasound examination of the pelvis was performed including evaluation of the uterus, ovaries, adnexal regions, and pelvic cul-de-sac. COMPARISON:  None. FINDINGS: Uterus Measurements: 14.3 x 8.4 x 11.5 cm = volume: 726 mL. Normal postpartum enlargement without uterine fibroids. Endometrium Thickness: 21.2 mm. Heterogeneous. There is mild internal endometrial vascularity. Right ovary Measurements: 2.6 x 1.5 x 2.3 cm = volume: 4.6 mL. Normal appearance/no adnexal mass. Left ovary Measurements: 3.1 x 0.2 x 2.9 cm = volume: 5.5 mL. Normal appearance/no adnexal mass. Other findings:  No abnormal free fluid. IMPRESSION: 1. Heterogeneously thickened endometrium with scattered foci of internal vascularity. Sonographic findings are compatible with retained products of conception in the appropriate clinical setting. 2. Normal sonographic appearance of the ovaries. Electronically Signed   By: Narda Rutherford M.D.   On: 01/20/2019 02:31   US RENAL  Result Date: 01/20/2019 CLINICAL DATA:  Bleeding. EXAM: RENAL / URINARY TRACT ULTRASOUND COMPLETE COMPARISON:  None. FINDINGS: Right Kidney: Renal measurements: 12.7 x 4.3 x  6.3 cm = volume: 179 mL . Echogenicity within normal limits. No mass or hydronephrosis visualized. Left Kidney: Renal measurements: 13.2 x 4.6 x 6.1 cm = volume: 191 mL. Echogenicity within normal limits. No mass or hydronephrosis visualized. Bladder: The bladder is underdistended which limits evaluation. Other: None. IMPRESSION: No acute abnormality.  No evidence for hydronephrosis.  Electronically Signed   By: Katherine Mantle M.D.   On: 01/20/2019 02:30   CBC, CMP and lactic acid WNL. No suspicion of POC - PP bleeding normal at this time  Presentation consistent with postpartum endometritis   Consult with Dr Earlene Plater with assessment and management - recommends Augmentin for treatment and follow up in the office in 2-3 day.   Discussed plan of care with patient, first dose of augmentin given in MAU prior to discharge home. Discussed with patient augmentin is safe while breastfeeding according to lactmed.. According to up to date - oral treatment is recommended for 14 days, Rx for Augmentin BID x14 days sent to pharmacy of choice. Discussed with patient importance of follow up this week - patient prefers appointment with Dr Adrian Blackwater, message sent to office for scheduling. Discussed with patient Tylenol and ibuprofen safe to continue. Pt stable at time of discharge.   Assessment and Plan   1. Postpartum endometritis    Discharge home Follow up as scheduled in the office on 1/21 Return to MAU as needed for reasons discussed and/or emergencies  Rx for Augmentin sent to pharmacy  Ibuprofen and/or Tylenol as needed   Follow-up Information    [facility]. Call.   Specialty: Obstetrics and Gynecology Why: Call to be seen on Thursday  Contact information: [address] [place] [address] [phone]         Allergies as of 01/20/2019   No Known Allergies     Medication List    TAKE these medications   acetaminophen 325 MG tablet Commonly known as: Tylenol Take 2 tablets (650 mg total) by mouth every 4 (four) hours as needed (for pain scale < 4).   amoxicillin-clavulanate 875-125 MG tablet Commonly known as: AUGMENTIN Take 1 tablet by mouth 2 (two) times daily for 14 days.   ibuprofen 600 MG tablet Commonly known as: ADVIL Take 1 tablet (600 mg total) by mouth every 6 (six) hours.    prenatal multivitamin Tabs tablet Take 1 tablet by mouth daily at 12 noon.       Sharyon Cable CNM 01/20/2019, 4:28 AM

## 2019-01-21 ENCOUNTER — Telehealth: Payer: Self-pay

## 2019-01-21 LAB — CULTURE, OB URINE: Culture: NO GROWTH

## 2019-01-21 NOTE — Telephone Encounter (Signed)
-----   Message from Garwin Brothers, MD sent at 01/11/2019  4:57 PM EST ----- The results of the study is unremarkable. Please inform patient. I will discuss in detail at next appointment. Cc  primary care/referring physician Garwin Brothers, MD 01/11/2019 4:57 PM

## 2019-01-21 NOTE — Telephone Encounter (Signed)
Left message that results were ok, copy sent to Dr. Adrian Blackwater

## 2019-01-22 ENCOUNTER — Other Ambulatory Visit: Payer: Self-pay

## 2019-01-22 ENCOUNTER — Ambulatory Visit (INDEPENDENT_AMBULATORY_CARE_PROVIDER_SITE_OTHER): Payer: 59 | Admitting: Family Medicine

## 2019-01-22 ENCOUNTER — Encounter: Payer: Self-pay | Admitting: Family Medicine

## 2019-01-22 VITALS — BP 124/74 | HR 83 | Temp 98.2°F | Wt 203.0 lb

## 2019-01-22 DIAGNOSIS — O8612 Endometritis following delivery: Secondary | ICD-10-CM | POA: Diagnosis not present

## 2019-01-22 DIAGNOSIS — N719 Inflammatory disease of uterus, unspecified: Secondary | ICD-10-CM

## 2019-01-22 NOTE — Progress Notes (Signed)
   Subjective:    Patient ID: Sheila Kirby, female    DOB: 28-Jun-1995, 24 y.o.   MRN: [id number]  HPI Patient seen for follow up of endometritis. Fever, abdominal/pelvic pain, went to MAU on Monday and was prescribed Augmentin. Fever broke yesterday. Feeling better. No foul discharge. No nausea. No probems with breasts.   Review of Systems     Objective:   Physical Exam Constitutional:      Appearance: Normal appearance.  Cardiovascular:     Rate and Rhythm: Normal rate.     Pulses: Normal pulses.  Pulmonary:     Effort: Pulmonary effort is normal.  Abdominal:     General: Abdomen is flat.     Palpations: Abdomen is soft.     Tenderness: There is abdominal tenderness (mild lower quadrant tenderness).  Neurological:     Mental Status: She is alert.        Assessment & Plan:  1. Endometritis 9 days PP. Continue augmentin. Recommended probiotics. F/u as need or in 3 weeks for PP visit.

## 2019-01-22 NOTE — Progress Notes (Signed)
Patient follow up from endometritis. Patient is nine days postpartum. [name] RN

## 2019-01-25 LAB — CULTURE, BLOOD (ROUTINE X 2)
Culture: NO GROWTH
Culture: NO GROWTH
Special Requests: ADEQUATE
Special Requests: ADEQUATE

## 2019-01-27 MED ORDER — MISOPROSTOL 200 MCG PO TABS: ORAL_TABLET | ORAL | 1 refills | Status: DC

## 2019-01-27 MED FILL — miSOPROStol 200 MCG TABS: 200 | 1 days supply | Qty: 4 | Fill #0

## 2019-01-29 ENCOUNTER — Encounter: Payer: Self-pay | Admitting: Family Medicine

## 2019-01-29 ENCOUNTER — Ambulatory Visit (INDEPENDENT_AMBULATORY_CARE_PROVIDER_SITE_OTHER): Payer: 59 | Admitting: Family Medicine

## 2019-01-29 ENCOUNTER — Other Ambulatory Visit: Payer: Self-pay | Admitting: Obstetrics & Gynecology

## 2019-01-29 ENCOUNTER — Encounter (HOSPITAL_BASED_OUTPATIENT_CLINIC_OR_DEPARTMENT_OTHER): Payer: Self-pay | Admitting: Obstetrics & Gynecology

## 2019-01-29 ENCOUNTER — Telehealth: Payer: Self-pay

## 2019-01-29 ENCOUNTER — Other Ambulatory Visit: Payer: Self-pay

## 2019-01-29 VITALS — BP 109/61 | HR 79 | Ht 68.0 in | Wt 199.0 lb

## 2019-01-29 DIAGNOSIS — N719 Inflammatory disease of uterus, unspecified: Secondary | ICD-10-CM | POA: Diagnosis not present

## 2019-01-29 NOTE — Telephone Encounter (Signed)
-----   Message from [name], DO sent at 01/29/2019 10:44 AM EST ----- Regarding: D&E [facility] - can you get this patient added on for Dr Debroah Loop for a D&E for retained products after a vaginal delivery.  Thanks!

## 2019-01-29 NOTE — Telephone Encounter (Signed)
Call patient, no answer, left voicemail with surgery date and time and pre-op instructions

## 2019-01-29 NOTE — Progress Notes (Signed)
   Subjective:    Patient ID: Sheila Kirby, female    DOB: May 25, 1995, 24 y.o.   MRN: [id number]  HPI Patient seen for follow up of endometritis. She reports continuing to have pelvic pain that is constant. Ibuprofen and tylenol are helpful. Has continued on the antibiotic. Doesn't think she's had fever or chills. Has also had vaginal bleeding. She took a dose of cytotec Tuesday evening, passed some clots, but no tissue.   Review of Systems     Objective:   Physical Exam Vitals reviewed.  Constitutional:      Appearance: Normal appearance.  Cardiovascular:     Rate and Rhythm: Normal rate and regular rhythm.     Pulses: Normal pulses.  Pulmonary:     Effort: Pulmonary effort is normal.     Breath sounds: Normal breath sounds.  Skin:    Capillary Refill: Capillary refill takes less than 2 seconds.  Neurological:     General: No focal deficit present.     Mental Status: She is alert.       Assessment & Plan:  1. Endometritis Exam concerning for retained POC. Will schedule for D&E tomorrow if possible.

## 2019-01-30 ENCOUNTER — Encounter (HOSPITAL_BASED_OUTPATIENT_CLINIC_OR_DEPARTMENT_OTHER)
Admission: RE | Disposition: A | Discharge status: Home / Self Care | Payer: Self-pay | Source: Home / Self Care | Attending: Obstetrics & Gynecology

## 2019-01-30 ENCOUNTER — Ambulatory Visit (HOSPITAL_COMMUNITY)
Admission: RE | Admit: 2019-01-30 | Discharge: 2019-01-30 | Disposition: A | Discharge status: Home / Self Care | Payer: 59 | Attending: Obstetrics & Gynecology | Admitting: Obstetrics & Gynecology

## 2019-01-30 ENCOUNTER — Other Ambulatory Visit (HOSPITAL_COMMUNITY)
Admission: RE | Admit: 2019-01-30 | Discharge: 2019-01-30 | Disposition: A | Discharge status: Home / Self Care | Payer: 59 | Source: Ambulatory Visit | Attending: Obstetrics & Gynecology | Admitting: Obstetrics & Gynecology

## 2019-01-30 ENCOUNTER — Other Ambulatory Visit (HOSPITAL_COMMUNITY): Payer: Self-pay | Admitting: *Deleted

## 2019-01-30 ENCOUNTER — Encounter (HOSPITAL_BASED_OUTPATIENT_CLINIC_OR_DEPARTMENT_OTHER): Payer: Self-pay | Admitting: Obstetrics & Gynecology

## 2019-01-30 ENCOUNTER — Other Ambulatory Visit: Payer: Self-pay

## 2019-01-30 ENCOUNTER — Ambulatory Visit (HOSPITAL_BASED_OUTPATIENT_CLINIC_OR_DEPARTMENT_OTHER): Payer: 59 | Admitting: Anesthesiology

## 2019-01-30 DIAGNOSIS — R011 Cardiac murmur, unspecified: Secondary | ICD-10-CM | POA: Diagnosis not present

## 2019-01-30 DIAGNOSIS — J029 Acute pharyngitis, unspecified: Secondary | ICD-10-CM | POA: Diagnosis not present

## 2019-01-30 DIAGNOSIS — Z20822 Contact with and (suspected) exposure to covid-19: Secondary | ICD-10-CM | POA: Diagnosis not present

## 2019-01-30 DIAGNOSIS — O034 Incomplete spontaneous abortion without complication: Secondary | ICD-10-CM | POA: Diagnosis not present

## 2019-01-30 HISTORY — PX: DILATION AND EVACUATION: SHX1459

## 2019-01-30 LAB — RESPIRATORY PANEL BY RT PCR (FLU A&B, COVID)
Influenza A by PCR: NEGATIVE
Influenza B by PCR: NEGATIVE
SARS Coronavirus 2 by RT PCR: NEGATIVE

## 2019-01-30 SURGERY — DILATION AND EVACUATION, UTERUS
Anesthesia: General | Site: Vagina

## 2019-01-30 MED ORDER — FENTANYL CITRATE (PF) 100 MCG/2ML IJ SOLN
INTRAMUSCULAR | Status: AC
  Filled 2019-01-30: qty 2

## 2019-01-30 MED ORDER — MIDAZOLAM HCL 2 MG/2ML IJ SOLN
INTRAMUSCULAR | Status: AC
  Filled 2019-01-30: qty 2

## 2019-01-30 MED ORDER — METHYLERGONOVINE MALEATE 0.2 MG PO TABS: 0.2000 mg | ORAL_TABLET | Freq: Four times a day (QID) | ORAL | 0 refills | Status: DC

## 2019-01-30 MED ORDER — METHYLERGONOVINE MALEATE 0.2 MG/ML IJ SOLN
INTRAMUSCULAR | Status: DC | PRN
  Administered 2019-01-30: .2 mg via INTRAMUSCULAR

## 2019-01-30 MED ORDER — IBUPROFEN 600 MG PO TABS: 600.0000 mg | ORAL_TABLET | Freq: Four times a day (QID) | ORAL | 1 refills | Status: DC | PRN

## 2019-01-30 MED ORDER — PROPOFOL 10 MG/ML IV BOLUS
INTRAVENOUS | Status: AC
  Filled 2019-01-30: qty 20

## 2019-01-30 MED ORDER — MIDAZOLAM HCL 5 MG/5ML IJ SOLN
INTRAMUSCULAR | Status: DC | PRN
  Administered 2019-01-30: 2 mg via INTRAVENOUS

## 2019-01-30 MED ORDER — EPHEDRINE 5 MG/ML INJ
INTRAVENOUS | Status: AC
  Filled 2019-01-30: qty 10

## 2019-01-30 MED ORDER — LIDOCAINE HCL (CARDIAC) PF 100 MG/5ML IV SOSY
PREFILLED_SYRINGE | INTRAVENOUS | Status: DC | PRN
  Administered 2019-01-30: 75 mg via INTRAVENOUS

## 2019-01-30 MED ORDER — LACTATED RINGERS IV SOLN: INTRAVENOUS | Status: DC

## 2019-01-30 MED ORDER — SODIUM CHLORIDE 0.9 % IV SOLN
INTRAVENOUS | Status: AC
  Filled 2019-01-30 (×2): qty 100

## 2019-01-30 MED ORDER — DEXAMETHASONE SODIUM PHOSPHATE 10 MG/ML IJ SOLN
INTRAMUSCULAR | Status: AC
  Filled 2019-01-30: qty 1

## 2019-01-30 MED ORDER — DEXAMETHASONE SODIUM PHOSPHATE 4 MG/ML IJ SOLN
INTRAMUSCULAR | Status: DC | PRN
  Administered 2019-01-30: 10 mg via INTRAVENOUS

## 2019-01-30 MED ORDER — ONDANSETRON HCL 4 MG/2ML IJ SOLN
INTRAMUSCULAR | Status: AC
  Filled 2019-01-30: qty 2

## 2019-01-30 MED ORDER — DOXYCYCLINE HYCLATE 100 MG IV SOLR
200.0000 mg | INTRAVENOUS | Status: AC
  Administered 2019-01-30: 200 mg via INTRAVENOUS

## 2019-01-30 MED ORDER — PROPOFOL 10 MG/ML IV BOLUS
INTRAVENOUS | Status: DC | PRN
  Administered 2019-01-30: 200 mg via INTRAVENOUS

## 2019-01-30 MED ORDER — FENTANYL CITRATE (PF) 100 MCG/2ML IJ SOLN
INTRAMUSCULAR | Status: DC | PRN
  Administered 2019-01-30: 50 ug via INTRAVENOUS

## 2019-01-30 MED ORDER — LIDOCAINE 2% (20 MG/ML) 5 ML SYRINGE
INTRAMUSCULAR | Status: AC
  Filled 2019-01-30: qty 5

## 2019-01-30 MED FILL — IBUPROFEN 600 MG TABLET: 600 | 7 days supply | Qty: 30 | Fill #0

## 2019-01-30 MED FILL — METHYLERGONOVINE MALEATE 0.: 0.2 | 3 days supply | Qty: 12 | Fill #0

## 2019-01-30 SURGICAL SUPPLY — 25 items
CATH ROBINSON RED A/P 14FR (CATHETERS) ×3 IMPLANT
DECANTER SPIKE VIAL GLASS SM (MISCELLANEOUS) IMPLANT
FILTER UTR ASPR ASSEMBLY (MISCELLANEOUS) ×3 IMPLANT
GAUZE 4X4 16PLY RFD (DISPOSABLE) ×3 IMPLANT
GLOVE BIO SURGEON STRL SZ 6.5 (GLOVE) ×4 IMPLANT
GLOVE BIO SURGEONS STRL SZ 6.5 (GLOVE) ×2
GLOVE BIOGEL PI IND STRL 7.0 (GLOVE) ×3 IMPLANT
GLOVE BIOGEL PI INDICATOR 7.0 (GLOVE) ×6
GOWN STRL REUS W/ TWL LRG LVL3 (GOWN DISPOSABLE) ×1 IMPLANT
GOWN STRL REUS W/TWL LRG LVL3 (GOWN DISPOSABLE) ×8 IMPLANT
HIBICLENS CHG 4% 4OZ BTL (MISCELLANEOUS) IMPLANT
HOSE CONNECTING 18IN BERKELEY (TUBING) ×3 IMPLANT
KIT BERKELEY 1ST TRI 3/8 NO TR (MISCELLANEOUS) ×3 IMPLANT
KIT BERKELEY 1ST TRIMESTER 3/8 (MISCELLANEOUS) ×3 IMPLANT
NS IRRIG 1000ML POUR BTL (IV SOLUTION) ×3 IMPLANT
PACK VAGINAL MINOR WOMEN LF (CUSTOM PROCEDURE TRAY) ×3 IMPLANT
PAD OB MATERNITY 4.3X12.25 (PERSONAL CARE ITEMS) ×3 IMPLANT
PAD PREP 24X48 CUFFED NSTRL (MISCELLANEOUS) ×3 IMPLANT
SET BERKELEY SUCTION TUBING (SUCTIONS) ×3 IMPLANT
SLEEVE SCD COMPRESS KNEE MED (MISCELLANEOUS) ×3 IMPLANT
TOWEL GREEN STERILE FF (TOWEL DISPOSABLE) ×3 IMPLANT
VACURETTE 10 RIGID CVD (CANNULA) ×3 IMPLANT
VACURETTE 7MM CVD STRL WRAP (CANNULA) IMPLANT
VACURETTE 8 RIGID CVD (CANNULA) IMPLANT
VACURETTE 9 RIGID CVD (CANNULA) IMPLANT

## 2019-01-30 NOTE — Op Note (Signed)
Sheila Kirby PROCEDURE DATE: 01/30/2019  PREOPERATIVE DIAGNOSIS: 17 days postpartum suspected retained POC POSTOPERATIVE DIAGNOSIS: The same PROCEDURE:     Dilation and Evacuation SURGEON:  Scheryl Darter MD  INDICATIONS: 24 y.o. [phone] with MAB at 17 days postpartum with bleeding and possible retained POC,  needing surgical completion.  Risks of surgery were discussed with the patient including but not limited to: bleeding which may require transfusion; infection which may require antibiotics; injury to uterus or surrounding organs; need for additional procedures including laparotomy or laparoscopy; possibility of intrauterine scarring which may impair future fertility; and other postoperative/anesthesia complications. Written informed consent was obtained.    FINDINGS:  A 8-10 week size uterus, very small amounts of tissure, specimen sent to pathology.  ANESTHESIA:    Monitored intravenous sedation, paracervical block. INTRAVENOUS FLUIDS:  500 ml of LR ESTIMATED BLOOD LOSS:  Less than 50 ml. SPECIMENS:  Tissue specimen sent to pathology COMPLICATIONS:  None immediate.  PROCEDURE DETAILS:  The patient received intravenous Doxycycline while in the preoperative area.  She was then taken to the operating room where monitored intravenous sedation was administered and was found to be adequate.  After an adequate timeout was performed, she was placed in the dorsal lithotomy position and examined; then prepped and draped in the sterile manner.   A vaginal speculum was then placed in the patient's vagina and a single tooth tenaculum was applied to the anterior lip of the cervix.  A paracervical block using 10 ml of 0.5% Marcaine was administered. The cervix was gently dilated to accommodate a 10 mm suction curette that was gently advanced to the uterine fundus.  The suction device was then activated and curette slowly rotated to clear the uterus of products of conception.  A sharp curettage was then  performed to confirm complete emptying of the uterus. There was minimal bleeding noted and the tenaculum removed with good hemostasis noted.   All instruments were removed from the patient's vagina.  Sponge and instrument counts were correct times two  The patient tolerated the procedure well and was taken to the recovery area awake, and in stable condition.  Adam Phenix, MD 01/30/2019 1:37 PM

## 2019-01-30 NOTE — Anesthesia Procedure Notes (Signed)
Procedure Name: LMA Insertion Date/Time: 01/30/2019 1:12 PM Performed by: Smyer Desanctis, CRNA Pre-anesthesia Checklist: Patient identified, Emergency Drugs available, Suction available, Patient being monitored and Timeout performed Patient Re-evaluated:Patient Re-evaluated prior to induction Oxygen Delivery Method: Circle system utilized Preoxygenation: Pre-oxygenation with 100% oxygen Induction Type: IV induction Ventilation: Mask ventilation without difficulty LMA: LMA inserted LMA Size: 4.0 Number of attempts: 1 Airway Equipment and Method: Bite block Placement Confirmation: positive ETCO2 Tube secured with: Tape Dental Injury: Teeth and Oropharynx as per pre-operative assessment

## 2019-01-30 NOTE — Transfer of Care (Signed)
Immediate Anesthesia Transfer of Care Note  Patient: Fredricka Bonine  Procedure(s) Performed: DILATATION AND EVACUATION (N/A )  Patient Location: PACU  Anesthesia Type:General  Level of Consciousness: awake and patient cooperative  Airway & Oxygen Therapy: Patient Spontanous Breathing and Patient connected to face mask oxygen  Post-op Assessment: Report given to RN and Post -op Vital signs reviewed and stable  Post vital signs: Reviewed and stable  Last Vitals:  Vitals Value Taken Time  BP    Temp    Pulse    Resp    SpO2      Last Pain:  Vitals:   01/30/19 1134  TempSrc: Tympanic  PainSc: 2       Patients Stated Pain Goal: 4 (01/30/19 1134)  Complications: No apparent anesthesia complications

## 2019-01-30 NOTE — H&P (Signed)
Sheila Kirby is an 24 y.o. female. [id number]   Pertinent Gynecological History:  OB History: [id number] HPI Patient seen for follow up of endometritis. She reports continuing to have pelvic pain that is constant. Ibuprofen and tylenol are helpful. Has continued on the antibiotic. Doesn't think she's had fever or chills. Has also had vaginal bleeding. She took a dose of cytotec Tuesday evening, passed some clots, but no tissue. US showed suspicion for retained POC, scheduled for D&E today  S/p SVD 01/13/19  Menstrual History:  Patient's last menstrual period was 04/12/2018.    Past Medical History:  Diagnosis Date  . Medical history non-contributory   . Sore throat 12/24/2018  . Symptoms of upper respiratory infection (URI) 12/24/2018    Past Surgical History:  Procedure Laterality Date  . NO PAST SURGERIES    . WISDOM TOOTH EXTRACTION      Family History  Problem Relation Age of Onset  . Hypertension Mother   . Thyroid disease Mother   . Diabetes Father   . Hypertension Father   . Cancer Sister        skin cancer  . Cancer Maternal Grandfather        skin  . Cancer Paternal Grandfather        renal  . Spina bifida Paternal Uncle   . Heart attack Paternal Grandmother   . Heart attack Maternal Aunt   . Heart attack Maternal Aunt   . Heart attack Maternal Aunt   . Stroke Neg Hx   . Miscarriages / Stillbirths Neg Hx   . Mental retardation Neg Hx     Social History:  reports that she has never smoked. She has never used smokeless tobacco. She reports that she does not drink alcohol or use drugs.  Allergies: No Known Allergies  Medications Prior to Admission  Medication Sig Dispense Refill Last Dose  . amoxicillin-clavulanate (AUGMENTIN) 875-125 MG tablet Take 1 tablet by mouth 2 (two) times daily for 14 days. 28 tablet 0 01/29/2019 at Unknown time  . Prenatal Vit-Fe Fumarate-FA (PRENATAL MULTIVITAMIN) TABS tablet Take 1 tablet by mouth daily at 12 noon.    01/29/2019 at Unknown time    Review of Systems  Blood pressure 116/61, pulse 88, temperature 97.7 F (36.5 C), temperature source Tympanic, resp. rate 16, height 5\' 8"  (1.727 m), weight 89.1 kg, last menstrual period 04/12/2018, SpO2 98 %, currently breastfeeding.  Physical Exam Vitals reviewed.  Constitutional:      Appearance: Normal appearance.  Cardiovascular:     Rate and Rhythm: Normal rate and regular rhythm.     Pulses: Normal pulses.  Pulmonary:     Effort: Pulmonary effort is normal.     Breath sounds: Normal breath sounds.  Skin:    Capillary Refill: Capillary refill takes less than 2 seconds.  Neurological:     General: No focal deficit present.     Mental Status: She is alert.   Results for orders placed or performed during the hospital encounter of 01/30/19 (from the past 24 hour(s))  Respiratory Panel by RT PCR (Flu A&B, Covid) - Nasopharyngeal Swab     Status: None   Collection Time: 01/30/19  8:11 AM   Specimen: Nasopharyngeal Swab  Result Value Ref Range   SARS Coronavirus 2 by RT PCR NEGATIVE NEGATIVE   Influenza A by PCR NEGATIVE NEGATIVE   Influenza B by PCR NEGATIVE NEGATIVE   CLINICAL DATA:  Heavy vaginal bleeding. Abdominal and lower back pain. Fever. Vaginal delivery  1 week prior.  EXAM: TRANSABDOMINAL ULTRASOUND OF PELVIS  TECHNIQUE: Transabdominal ultrasound examination of the pelvis was performed including evaluation of the uterus, ovaries, adnexal regions, and pelvic cul-de-sac.  COMPARISON:  None.  FINDINGS: Uterus  Measurements: 14.3 x 8.4 x 11.5 cm = volume: 726 mL. Normal postpartum enlargement without uterine fibroids.  Endometrium  Thickness: 21.2 mm. Heterogeneous. There is mild internal endometrial vascularity.  Right ovary  Measurements: 2.6 x 1.5 x 2.3 cm = volume: 4.6 mL. Normal appearance/no adnexal mass.  Left ovary  Measurements: 3.1 x 0.2 x 2.9 cm = volume: 5.5 mL. Normal appearance/no adnexal  mass.  Other findings:  No abnormal free fluid.  IMPRESSION: 1. Heterogeneously thickened endometrium with scattered foci of internal vascularity. Sonographic findings are compatible with retained products of conception in the appropriate clinical setting. 2. Normal sonographic appearance of the ovaries.   Electronically Signed   By: Keith Rake M.D.   On: 01/20/2019 02:31 CBC    Component Value Date/Time   WBC 9.4 01/20/2019 0024   RBC 3.98 01/20/2019 0024   HGB 11.5 (L) 01/20/2019 0024   HGB 11.6 12/12/2018 1102   HCT 34.1 (L) 01/20/2019 0024   HCT 34.6 12/12/2018 1102   PLT 256 01/20/2019 0024   PLT 189 12/12/2018 1102   MCV 85.7 01/20/2019 0024   MCV 86 12/12/2018 1102   MCH 28.9 01/20/2019 0024   MCHC 33.7 01/20/2019 0024   RDW 12.0 01/20/2019 0024   RDW 12.1 12/12/2018 1102   LYMPHSABS 1.1 01/20/2019 0024   LYMPHSABS 1.4 06/16/2018 1024   MONOABS 0.5 01/20/2019 0024   EOSABS 0.1 01/20/2019 0024   EOSABS 0.0 06/16/2018 1024   BASOSABS 0.0 01/20/2019 0024   BASOSABS 0.0 06/16/2018 1024     Assessment/Plan: Suspected retained POC, for suction D&E today. Patient desires surgical management with D&E.  The risks of surgery were discussed in detail with the patient including but not limited to: bleeding which may require transfusion or reoperation; infection which may require prolonged hospitalization or re-hospitalization and antibiotic therapy; injury to bowel, bladder, ureters and major vessels or other surrounding organs; need for additional procedures including laparotomy; thromboembolic phenomenon, incisional problems and other postoperative or anesthesia complications.  Patient was told that the likelihood that her condition and symptoms will be treated effectively with this surgical management was very high; the postoperative expectations were also discussed in detail. The patient also understands the alternative treatment options which were discussed in  full. All questions were answered.     Emeterio Reeve 01/30/2019, 11:53 AM

## 2019-01-30 NOTE — Discharge Instructions (Signed)
Dilation and Curettage or Vacuum Curettage, Care After These instructions give you information about caring for yourself after your procedure. Your doctor may also give you more specific instructions. Call your doctor if you have any problems or questions after your procedure. Follow these instructions at home: Activity  Do not drive or use heavy machinery while taking prescription pain medicine.  For 24 hours after your procedure, avoid driving.  Take short walks often, followed by rest periods. Ask your doctor what activities are safe for you. After one or two days, you may be able to return to your normal activities.  Do not lift anything that is heavier than 10 lb (4.5 kg) until your doctor approves.  For at least 2 weeks, or as long as told by your doctor: ? Do not douche. ? Do not use tampons. ? Do not have sex. General instructions   Take over-the-counter and prescription medicines only as told by your doctor. This is very important if you take blood thinning medicine.  Do not take baths, swim, or use a hot tub until your doctor approves. Take showers instead of baths.  Wear compression stockings as told by your doctor.  It is up to you to get the results of your procedure. Ask your doctor when your results will be ready.  Keep all follow-up visits as told by your doctor. This is important. Contact a doctor if:  You have very bad cramps that get worse or do not get better with medicine.  You have very bad pain in your belly (abdomen).  You cannot drink fluids without throwing up (vomiting).  You get pain in a different part of the area between your belly and thighs (pelvis).  You have bad-smelling discharge from your vagina.  You have a rash. Get help right away if:  You are bleeding a lot from your vagina. A lot of bleeding means soaking more than one sanitary pad in an hour, for 2 hours in a row.  You have clumps of blood (blood clots) coming from your  vagina.  You have a fever or chills.  Your belly feels very tender or hard.  You have chest pain.  You have trouble breathing.  You cough up blood.  You feel dizzy.  You feel light-headed.  You pass out (faint).  You have pain in your neck or shoulder area. Summary  Take short walks often, followed by rest periods. Ask your doctor what activities are safe for you. After one or two days, you may be able to return to your normal activities.  Do not lift anything that is heavier than 10 lb (4.5 kg) until your doctor approves.  Do not take baths, swim, or use a hot tub until your doctor approves. Take showers instead of baths.  Contact your doctor if you have any symptoms of infection, like bad-smelling discharge from your vagina. This information is not intended to replace advice given to you by your health care provider. Make sure you discuss any questions you have with your health care provider. Document Revised: 11/30/2016 Document Reviewed: 09/05/2015 Elsevier Patient Education  [place] Instructions  Activity: Get plenty of rest for the remainder of the day. A responsible individual must stay with you for 24 hours following the procedure.  For the next 24 hours, DO NOT: -Drive a car -Paediatric nurse -Drink alcoholic beverages -Take any medication unless instructed by your physician -Make any legal decisions or sign important papers.  Meals: Start with liquid foods such as gelatin or soup. Progress to regular foods as tolerated. Avoid greasy, spicy, heavy foods. If nausea and/or vomiting occur, drink only clear liquids until the nausea and/or vomiting subsides. Call your physician if vomiting continues.  Special Instructions/Symptoms: Your throat may feel dry or sore from the anesthesia or the breathing tube placed in your throat during surgery. If this causes discomfort, gargle with warm salt water. The discomfort should  disappear within 24 hours.  If you had a scopolamine patch placed behind your ear for the management of post- operative nausea and/or vomiting:  1. The medication in the patch is effective for 72 hours, after which it should be removed.  Wrap patch in a tissue and discard in the trash. Wash hands thoroughly with soap and water. 2. You may remove the patch earlier than 72 hours if you experience unpleasant side effects which may include dry mouth, dizziness or visual disturbances. 3. Avoid touching the patch. Wash your hands with soap and water after contact with the patch.

## 2019-01-30 NOTE — Anesthesia Preprocedure Evaluation (Signed)
Anesthesia Evaluation  Patient identified by MRN, date of birth, ID band Patient awake    Reviewed: Allergy & Precautions, H&P , NPO status , Patient's Chart, lab work & pertinent test results  Airway Mallampati: II  TM Distance: >3 FB Neck ROM: Full    Dental no notable dental hx. (+) Teeth Intact, Dental Advisory Given   Pulmonary neg pulmonary ROS,    Pulmonary exam normal breath sounds clear to auscultation       Cardiovascular negative cardio ROS   Rhythm:Regular Rate:Normal     Neuro/Psych negative neurological ROS  negative psych ROS   GI/Hepatic negative GI ROS, Neg liver ROS,   Endo/Other  negative endocrine ROS  Renal/GU negative Renal ROS  negative genitourinary   Musculoskeletal   Abdominal   Peds  Hematology negative hematology ROS (+)   Anesthesia Other Findings   Reproductive/Obstetrics negative OB ROS                            Anesthesia Physical Anesthesia Plan  ASA: I  Anesthesia Plan: General   Post-op Pain Management:    Induction: Intravenous  PONV Risk Score and Plan: 4 or greater and Ondansetron, Dexamethasone and Midazolam  Airway Management Planned: LMA  Additional Equipment:   Intra-op Plan:   Post-operative Plan: Extubation in OR  Informed Consent: I have reviewed the patients History and Physical, chart, labs and discussed the procedure including the risks, benefits and alternatives for the proposed anesthesia with the patient or authorized representative who has indicated his/her understanding and acceptance.   Dental advisory given  Plan Discussed with: CRNA  Anesthesia Plan Comments:         Anesthesia Quick Evaluation  

## 2019-01-30 NOTE — Anesthesia Postprocedure Evaluation (Signed)
Anesthesia Post Note  Patient: Sheila Kirby  Procedure(s) Performed: DILATATION AND EVACUATION (N/A Vagina )     Patient location during evaluation: PACU Anesthesia Type: General Level of consciousness: awake and alert Pain management: pain level controlled Vital Signs Assessment: post-procedure vital signs reviewed and stable Respiratory status: spontaneous breathing, nonlabored ventilation and respiratory function stable Cardiovascular status: blood pressure returned to baseline and stable Postop Assessment: no apparent nausea or vomiting Anesthetic complications: no    Last Vitals:  Vitals:   01/30/19 1410 01/30/19 1432  BP:  113/66  Pulse: 66   Resp: 17   Temp:  36.9 C  SpO2: 98%     Last Pain:  Vitals:   01/30/19 1432  TempSrc: Oral  PainSc: 0-No pain                 [name],W. EDMOND

## 2019-02-02 ENCOUNTER — Encounter: Payer: Self-pay | Admitting: *Deleted

## 2019-02-02 LAB — SURGICAL PATHOLOGY

## 2019-02-05 ENCOUNTER — Ambulatory Visit: Payer: 59 | Attending: Internal Medicine

## 2019-02-05 DIAGNOSIS — Z20822 Contact with and (suspected) exposure to covid-19: Secondary | ICD-10-CM | POA: Diagnosis not present

## 2019-02-06 LAB — NOVEL CORONAVIRUS, NAA: SARS-CoV-2, NAA: NOT DETECTED

## 2019-02-13 ENCOUNTER — Other Ambulatory Visit: Payer: Self-pay

## 2019-02-13 ENCOUNTER — Ambulatory Visit (INDEPENDENT_AMBULATORY_CARE_PROVIDER_SITE_OTHER): Payer: 59 | Admitting: Family Medicine

## 2019-02-13 DIAGNOSIS — Z1389 Encounter for screening for other disorder: Secondary | ICD-10-CM

## 2019-02-13 DIAGNOSIS — Z975 Presence of (intrauterine) contraceptive device: Secondary | ICD-10-CM

## 2019-02-13 DIAGNOSIS — Z30430 Encounter for insertion of intrauterine contraceptive device: Secondary | ICD-10-CM | POA: Diagnosis not present

## 2019-02-13 MED ORDER — LEVONORGESTREL 19.5 MCG/DAY IU IUD: INTRAUTERINE_SYSTEM | Freq: Once | INTRAUTERINE | Status: AC

## 2019-02-13 NOTE — Progress Notes (Signed)
Post Partum Exam  Sheila Kirby is a 24 y.o. [id number] female who presents for a postpartum visit. She is 4 weeks postpartum following a spontaneous vaginal delivery. I have fully reviewed the prenatal and intrapartum course. The delivery was at 39.3 gestational weeks.  Anesthesia: none. Postpartum course has been complicated by endometritis, possible retained products necessitating a D&C. Baby's course has been doing well. Baby is feeding by breast. Bleeding thin lochia. Bowel function is normal. Bladder function is normal. Patient is not sexually active. Contraception method is abstinence.  Postpartum depression screening:neg, score 4.  The following portions of the patient's history were reviewed and updated as appropriate: allergies, current medications, past family history, past medical history, past social history, past surgical history and problem list. Last pap smear done 09/2016 and was Normal  Review of Systems Pertinent items are noted in HPI.    Objective:  currently breastfeeding.  General:  alert, cooperative and no distress  Lungs: clear to auscultation bilaterally  Heart:  regular rate and rhythm, S1, S2 normal, no murmur, click, rub or gallop  Abdomen: soft, non-tender; bowel sounds normal; no masses,  no organomegaly   Vulva:  normal  Vagina: normal vagina, no discharge, exudate, lesion, or erythema  Cervix:  multiparous appearance   IUD Procedure Note Patient identified, informed consent performed, signed copy in chart, time out was performed.  Urine pregnancy test negative.  Speculum placed in the vagina.  Cervix visualized.  Cleaned with Betadine x 2.  Grasped anteriorly with a single tooth tenaculum.  Uterus sounded to 8 cm.  Liletta  IUD placed per manufacturer's recommendations.  Strings trimmed to 3 cm. Tenaculum was removed, good hemostasis noted.  Patient tolerated procedure well.   Patient given post procedure instructions and Liletta care card with expiration  date.  Patient is asked to check IUD strings periodically and follow up in 4-6 weeks for IUD check.        Assessment:    normal postpartum exam. Pap smear not done at today's visit.   Plan:   1. Contraception: IUD 2. Follow up in: 1 month or as needed.

## 2019-03-13 MED ORDER — NORGESTIMATE-ETH ESTRADIOL 0.25-35 MG-MCG PO TABS: 1.0000 | ORAL_TABLET | Freq: Every day | ORAL | 0 refills | Status: DC

## 2019-03-13 MED FILL — VYLIBRA 0.25-35 MG-MCG TABS: 0.25-35 | 28 days supply | Qty: 28 | Fill #0

## 2019-03-16 ENCOUNTER — Ambulatory Visit (INDEPENDENT_AMBULATORY_CARE_PROVIDER_SITE_OTHER): Payer: 59 | Admitting: Obstetrics & Gynecology

## 2019-03-16 ENCOUNTER — Encounter: Payer: Self-pay | Admitting: Obstetrics & Gynecology

## 2019-03-16 ENCOUNTER — Other Ambulatory Visit: Payer: Self-pay

## 2019-03-16 VITALS — BP 109/69 | HR 69 | Ht 68.0 in | Wt 196.1 lb

## 2019-03-16 DIAGNOSIS — N921 Excessive and frequent menstruation with irregular cycle: Secondary | ICD-10-CM

## 2019-03-16 DIAGNOSIS — Z975 Presence of (intrauterine) contraceptive device: Secondary | ICD-10-CM

## 2019-03-16 DIAGNOSIS — N939 Abnormal uterine and vaginal bleeding, unspecified: Secondary | ICD-10-CM | POA: Diagnosis not present

## 2019-03-16 MED ORDER — NORETHIN ACE-ETH ESTRAD-FE 1-20 MG-MCG(24) PO TABS: 1.0000 | ORAL_TABLET | Freq: Every day | ORAL | 11 refills | Status: DC

## 2019-03-16 MED FILL — BLISOVI 24 FE 1-20 MG-MCG(2: 1-20 | 84 days supply | Qty: 84 | Fill #0

## 2019-03-16 NOTE — Progress Notes (Signed)
  GYNECOLOGY OFFICE ENCOUNTER NOTE  History:  24 y.o. [id number] here today for today for IUD string check; Liletta  IUD was placed  02/13/2019. Pt reports continued bleeding with the IUD. She notes daily bleeding of bright red blood. It is not a large amount but, it is consistent. Pt reports feeling frustrated over her entire PP experience. She had a D&E for retained POC and not cont to bleed. She denies pain.    The following portions of the patient's history were reviewed and updated as appropriate: allergies, current medications, past family history, past medical history, past social history, past surgical history and problem list.  Review of Systems:  Pertinent items are noted in HPI.   Objective:  Physical Exam Blood pressure 109/69, pulse 69, height 5\' 8"  (1.727 m), weight 196 lb 1.3 oz (88.9 kg), currently breastfeeding. CONSTITUTIONAL: Well-developed, well-nourished female in no acute distress.  HENT:  Normocephalic, atraumatic. External right and left ear normal. Oropharynx is clear and moist EYES: Conjunctivae and EOM are normal. Pupils are equal, round, and reactive to light. No scleral icterus.  NECK: Normal range of motion, supple, no masses CARDIOVASCULAR: Normal heart rate noted RESPIRATORY: Effort and breath sounds normal, no problems with respiration noted ABDOMEN: Soft, no distention noted.   PELVIC: Normal appearing external genitalia; normal appearing vaginal mucosa and cervix.  IUD strings visualized, about 4 cm in length outside cervix. No blood noted in vault today.  Assessment & Plan:  Continued bleeding with LnIUD. Pt expressed frustration given her PP course with D&E etc.  Pt undecided about started OCPs has Rx at the pharmacy. We discussed the risks vs benefits including small risk of decreased milk supply.  Pt wants to try OCPs for 1 month. Reviewed instructions.  Rec f/u in 4 weeks or sooner prn  Pt reports good support at home.  Reviewed self care  plan.  Verlon Pischke L. Harraway-Smith, MD, FACOG Obstetrician & Gynecologist, [facility] for [facility]

## 2019-03-18 ENCOUNTER — Encounter: Payer: Self-pay | Admitting: Cardiology

## 2019-03-18 ENCOUNTER — Other Ambulatory Visit: Payer: Self-pay

## 2019-03-18 ENCOUNTER — Ambulatory Visit (INDEPENDENT_AMBULATORY_CARE_PROVIDER_SITE_OTHER): Payer: 59 | Admitting: Cardiology

## 2019-03-18 VITALS — BP 118/72 | HR 96 | Ht 68.0 in | Wt 195.0 lb

## 2019-03-18 DIAGNOSIS — R002 Palpitations: Secondary | ICD-10-CM | POA: Diagnosis not present

## 2019-03-18 NOTE — Patient Instructions (Addendum)
Medication Instructions:  No medication changes *If you need a refill on your cardiac medications before your next appointment, please call your pharmacy*   Lab Work: None ordered If you have labs (blood work) drawn today and your tests are completely normal, you will receive your results only by: . MyChart Message (if you have MyChart) OR . A paper copy in the mail If you have any lab test that is abnormal or we need to change your treatment, we will call you to review the results.   Testing/Procedures: None ordered   Follow-Up: At [facility], you and your health needs are our priority.  As part of our continuing mission to provide you with exceptional heart care, we have created designated Provider Care Teams.  These Care Teams include your primary Cardiologist (physician) and Advanced Practice Providers (APPs -  Physician Assistants and Nurse Practitioners) who all work together to provide you with the care you need, when you need it.  We recommend signing up for the patient portal called "MyChart".  Sign up information is provided on this After Visit Summary.  MyChart is used to connect with patients for Virtual Visits (Telemedicine).  Patients are able to view lab/test results, encounter notes, upcoming appointments, etc.  Non-urgent messages can be sent to your provider as well.   To learn more about what you can do with MyChart, go to [email or web address].    Your next appointment:   9 month(s)  The format for your next appointment:   In Person  Provider:   Rajan Revankar, MD   Other Instructions NA 

## 2019-03-18 NOTE — Progress Notes (Signed)
Cardiology Office Note:    Date:  03/18/2019   ID:  Sheila Kirby, DOB 03/07/1995, MRN [id number]  PCP:  [name], DO  Cardiologist:  Garwin Brothers, MD   Referring MD: [name], DO    ASSESSMENT:    1. Palpitations    PLAN:    In order of problems listed above:  1. Palpitations: These have largely resolved.  Patient denies any problems at this time.  She is very active.  She has increased her fluid and salt intake and is happy about it. 2. Reports of the echocardiogram and stress test were detailed to her and questions were answered to her satisfaction. 3. 9 months follow-up or earlier if she has any concerns.   Medication Adjustments/Labs and Tests Ordered: Current medicines are reviewed at length with the patient today.  Concerns regarding medicines are outlined above.  No orders of the defined types were placed in this encounter.  No orders of the defined types were placed in this encounter.    Chief Complaint  Patient presents with  . Follow-up    3 Months     History of Present Illness:    Sheila Kirby is a 24 y.o. female.  Patient has past medical history of palpitations.  She had issues with borderline blood pressure and was told to keep herself well-hydrated and increase salt intake in the diet.  Subsequently she has felt well.  No chest pain orthopnea or PND.  At the time of my evaluation, the patient is alert awake oriented and in no distress.  Past Medical History:  Diagnosis Date  . Abnormal uterine bleeding, postpartum 01/30/2019  . Cardiac murmur 12/17/2018  . Family history of spina bifida 11/15/2014  . Medical history non-contributory   . Sore throat 12/24/2018  . Symptoms of upper respiratory infection (URI) 12/24/2018    Past Surgical History:  Procedure Laterality Date  . DILATION AND EVACUATION N/A 01/30/2019   Procedure: DILATATION AND EVACUATION;  Surgeon: Adam Phenix, MD;  Location: [facility];  Service: Gynecology;  Laterality: N/A;  . NO PAST SURGERIES    . WISDOM TOOTH EXTRACTION      Current Medications: Current Meds  Medication Sig  . levonorgestrel (LILETTA, 52 MG,) 19.5 MCG/DAY IUD IUD 1 each by Intrauterine route once.     Allergies:   Patient has no known allergies.   Social History   Socioeconomic History  . Marital status: Married    Spouse name: Not on file  . Number of children: Not on file  . Years of education: Not on file  . Highest education level: Not on file  Occupational History  . Not on file  Tobacco Use  . Smoking status: Never Smoker  . Smokeless tobacco: Never Used  Substance and Sexual Activity  . Alcohol use: No  . Drug use: No  . Sexual activity: Yes    Birth control/protection: None  Other Topics Concern  . Not on file  Social History Narrative  . Not on file   Social Determinants of Health   Financial Resource Strain:   . Difficulty of Paying Living Expenses:   Food Insecurity:   . Worried About Programme researcher, broadcasting/film/video in the Last Year:   . Barista in the Last Year:   Transportation Needs:   . Freight forwarder (Medical):   Marland Kitchen Lack of Transportation (Non-Medical):   Physical Activity:   . Days of Exercise  per Week:   . Minutes of Exercise per Session:   Stress:   . Feeling of Stress :   Social Connections:   . Frequency of Communication with Friends and Family:   . Frequency of Social Gatherings with Friends and Family:   . Attends Religious Services:   . Active Member of Clubs or Organizations:   . Attends Archivist Meetings:   Marland Kitchen Marital Status:      Family History: The patient's family history includes Cancer in her maternal grandfather, paternal grandfather, and sister; Diabetes in her father; Heart attack in her maternal aunt, maternal aunt, maternal aunt, and paternal grandmother; Hypertension in her father and mother; Spina bifida in her paternal uncle; Thyroid disease in her mother.  There is no history of Stroke, Miscarriages / Stillbirths, or Mental retardation.  ROS:   Please see the history of present illness.    All other systems reviewed and are negative.  EKGs/Labs/Other Studies Reviewed:    The following studies were reviewed today: I discussed findings of the echocardiogram and monitoring with her at length they were unremarkable.   Recent Labs: 12/12/2018: Magnesium 1.9; TSH 1.230 01/20/2019: ALT 29; BUN 7; Creatinine, Ser 0.63; Hemoglobin 11.5; Platelets 256; Potassium 3.6; Sodium 140  Recent Lipid Panel No results found for: CHOL, TRIG, HDL, CHOLHDL, VLDL, LDLCALC, LDLDIRECT  Physical Exam:    VS:  BP 118/72   Pulse 96   Ht 5\' 8"  (1.727 m)   Wt 195 lb (88.5 kg)   SpO2 98%   BMI 29.65 kg/m     Wt Readings from Last 3 Encounters:  03/18/19 195 lb (88.5 kg)  03/16/19 196 lb 1.3 oz (88.9 kg)  02/13/19 196 lb (88.9 kg)     GEN: Patient is in no acute distress HEENT: Normal NECK: No JVD; No carotid bruits LYMPHATICS: No lymphadenopathy CARDIAC: Hear sounds regular, 2/6 systolic murmur at the apex. RESPIRATORY:  Clear to auscultation without rales, wheezing or rhonchi  ABDOMEN: Soft, non-tender, non-distended MUSCULOSKELETAL:  No edema; No deformity  SKIN: Warm and dry NEUROLOGIC:  Alert and oriented x 3 PSYCHIATRIC:  Normal affect   Signed, Jenean Lindau, MD  03/18/2019 4:09 PM    [facility]

## 2019-03-30 MED FILL — BLISOVI 24 FE 1-20 MG-MCG(2: 1-20 | 84 days supply | Qty: 84 | Fill #0

## 2019-04-17 ENCOUNTER — Other Ambulatory Visit: Payer: Self-pay

## 2019-04-17 ENCOUNTER — Encounter: Payer: Self-pay | Admitting: Family Medicine

## 2019-04-17 ENCOUNTER — Ambulatory Visit (INDEPENDENT_AMBULATORY_CARE_PROVIDER_SITE_OTHER): Payer: 59 | Admitting: Family Medicine

## 2019-04-17 VITALS — BP 124/69 | HR 70 | Ht 68.0 in | Wt 192.1 lb

## 2019-04-17 DIAGNOSIS — M9902 Segmental and somatic dysfunction of thoracic region: Secondary | ICD-10-CM

## 2019-04-17 DIAGNOSIS — M545 Low back pain, unspecified: Secondary | ICD-10-CM

## 2019-04-17 DIAGNOSIS — Z975 Presence of (intrauterine) contraceptive device: Secondary | ICD-10-CM | POA: Diagnosis not present

## 2019-04-17 DIAGNOSIS — M9905 Segmental and somatic dysfunction of pelvic region: Secondary | ICD-10-CM

## 2019-04-17 DIAGNOSIS — N921 Excessive and frequent menstruation with irregular cycle: Secondary | ICD-10-CM | POA: Diagnosis not present

## 2019-04-17 DIAGNOSIS — M9900 Segmental and somatic dysfunction of head region: Secondary | ICD-10-CM | POA: Diagnosis not present

## 2019-04-17 DIAGNOSIS — M9901 Segmental and somatic dysfunction of cervical region: Secondary | ICD-10-CM

## 2019-04-17 DIAGNOSIS — M9903 Segmental and somatic dysfunction of lumbar region: Secondary | ICD-10-CM

## 2019-04-17 DIAGNOSIS — M9908 Segmental and somatic dysfunction of rib cage: Secondary | ICD-10-CM

## 2019-04-17 DIAGNOSIS — M9904 Segmental and somatic dysfunction of sacral region: Secondary | ICD-10-CM

## 2019-04-17 DIAGNOSIS — G8929 Other chronic pain: Secondary | ICD-10-CM

## 2019-04-17 MED ORDER — MEDROXYPROGESTERONE ACETATE 10 MG PO TABS: 10.0000 mg | ORAL_TABLET | Freq: Every day | ORAL | 1 refills | Status: DC

## 2019-04-17 MED FILL — MEDROXYPROGESTERONE 10 MG T: 10 | 30 days supply | Qty: 30 | Fill #0

## 2019-04-17 NOTE — Progress Notes (Signed)
   Subjective:    Patient ID: Sheila Kirby, female    DOB: 12-27-1995, 24 y.o.   MRN: [id number]  HPI Seen for breakthrough bleeding with IUD. Tried loestrin, but this completely stopped her milk supply, so she stopped it after a couple days. Continues to have irregular bleeding and spotting. Didn't have this with first IUD.  Also having some back pain. Lower and mild back pain  Review of Systems     Objective:   Physical Exam Vitals reviewed.  Constitutional:      Appearance: Normal appearance.  Musculoskeletal:     Comments: Back spasm.  Neurological:     General: No focal deficit present.     Mental Status: She is alert.  Psychiatric:        Mood and Affect: Mood normal.        Behavior: Behavior normal.        Thought Content: Thought content normal.        Judgment: Judgment normal.     OSE: Head FSRRL  Cervical C3 FSSR  Thoracic T4 FSRR  Rib T4 rib inhaled  Lumbar L5 ESRR  Sacrum L/L  Pelvis Right ant innom      Assessment & Plan:  1. Breakthrough bleeding with IUD Trial of progesterone tablets in addition to IUD  2. Chronic midline low back pain without sciatica 3. Somatic dysfunction of head region 4. Somatic dysfunction of spine, cervical 5. Somatic dysfunction of spine, thoracic 6. Somatic dysfunction of rib 7. Somatic dysfunction of lumbar region 8. Somatic dysfunction of pelvis region 9. Somatic dysfunction of sacral region OMT done after patient permission. HVLA technique utilized. 7 areas treated with improvement of tissue texture and joint mobility. Patient tolerated procedure well.

## 2019-08-17 DIAGNOSIS — N8189 Other female genital prolapse: Secondary | ICD-10-CM

## 2019-09-01 ENCOUNTER — Other Ambulatory Visit: Payer: Self-pay

## 2019-09-01 DIAGNOSIS — N8189 Other female genital prolapse: Secondary | ICD-10-CM

## 2020-03-15 DIAGNOSIS — M955 Acquired deformity of pelvis: Secondary | ICD-10-CM | POA: Diagnosis not present

## 2020-03-15 DIAGNOSIS — Q760 Spina bifida occulta: Secondary | ICD-10-CM | POA: Diagnosis not present

## 2020-05-24 ENCOUNTER — Telehealth: Payer: 59 | Admitting: Family

## 2020-05-24 DIAGNOSIS — R399 Unspecified symptoms and signs involving the genitourinary system: Secondary | ICD-10-CM

## 2020-05-25 NOTE — Progress Notes (Signed)
Replied back to patient to see if back pain was related to UTI symptoms. If so, will refer her for a face to face visit.   Jannifer Rodney, FNP

## 2020-05-25 NOTE — Progress Notes (Signed)
Based on what you shared with me, I feel your condition warrants further evaluation and I recommend that you be seen in a face to face office visit.  Given your symptoms of UTI symptoms and back pain you need to be seen to rule out a more serious infection.    NOTE: If you entered your credit card information for this eVisit, you will not be charged. You may see a "hold" on your card for the $35 but that hold will drop off and you will not have a charge processed.   If you are having a true medical emergency please call 911.      For an urgent face to face visit, [place] has six urgent care centers for your convenience:     [facility] Center at [facility] Directions [phone] [address] [place], [place] [address] . 8 am - 4 pm Monday - Friday    [facility] Ur[facility] Center [facility]) Get Driving Directions [phone] [address] [place], [place] [address] . 8 am to 8 pm Monday-Friday . 10 am to 6 pm [facility] Urgent [facility]) Get Driving Directions [phone]  [address] [place],  [place]  [address] . 8 am to 8 pm Monday-Friday . 8 am to 4 pm [facility] Get Driving Directions [phone] [address] [place], [place] [address] . 8 am to 8 pm Monday-Friday . 8 am to 4 pm [facility] Urgent Care at [facility] Get Driving Directions  [phone] [address] [place], [place] [address] . 8 am to 8 pm Monday-Friday . 8 am to 4 pm [facility] Urgent Care at [facility] Directions [phone] [address] [place], [place] [address] . 8 am to 8 pm Monday-Friday . 8 am to 4 pm Saturday-Sunday     Your MyChart E-visit questionnaire answers were reviewed by a board certified advanced clinical practitioner  to complete your personal care plan based on your specific symptoms.  Thank you for using e-Visits.

## 2020-05-26 ENCOUNTER — Other Ambulatory Visit (HOSPITAL_COMMUNITY): Payer: Self-pay

## 2020-05-26 ENCOUNTER — Ambulatory Visit (HOSPITAL_COMMUNITY)
Admission: EM | Admit: 2020-05-26 | Discharge: 2020-05-26 | Disposition: A | Discharge status: Home / Self Care | Payer: Managed Care, Other (non HMO) | Attending: Internal Medicine | Admitting: Internal Medicine

## 2020-05-26 ENCOUNTER — Other Ambulatory Visit: Payer: Self-pay

## 2020-05-26 ENCOUNTER — Encounter (HOSPITAL_COMMUNITY): Payer: Self-pay | Admitting: Emergency Medicine

## 2020-05-26 DIAGNOSIS — N3000 Acute cystitis without hematuria: Secondary | ICD-10-CM | POA: Diagnosis not present

## 2020-05-26 LAB — POCT URINALYSIS DIPSTICK, ED / UC
Glucose, UA: 100 mg/dL — AB
Hgb urine dipstick: NEGATIVE
Ketones, ur: NEGATIVE mg/dL
Nitrite: POSITIVE — AB
Protein, ur: 30 mg/dL — AB
Specific Gravity, Urine: 1.025 (ref 1.005–1.030)
Urobilinogen, UA: 2 mg/dL — ABNORMAL HIGH (ref 0.0–1.0)
pH: 5 (ref 5.0–8.0)

## 2020-05-26 LAB — POC URINE PREG, ED: Preg Test, Ur: NEGATIVE

## 2020-05-26 MED ORDER — NITROFURANTOIN MONOHYD MACRO 100 MG PO CAPS
100.0000 mg | ORAL_CAPSULE | Freq: Two times a day (BID) | ORAL | 0 refills | Status: DC
  Filled 2020-05-26: qty 10, 5d supply, fill #0

## 2020-05-26 NOTE — ED Provider Notes (Signed)
[facility]    CSN: [id number] Arrival date & time: 05/26/20  0846      History   Chief Complaint Chief Complaint  Patient presents with  . Urinary Tract Infection    HPI Sheila Kirby is a 25 y.o. female.   Subjective:  Sheila Kirby is a 25 y.o. female who complains of frequency for 2 days.  Patient also complains of back pain. Patient denies fever, vaginal discharge or dysuria.  Patient does not have a history of recurrent UTI.  Patient does not have a history of pyelonephritis.  Patient has tried AZO with relief in her symptoms.  The following portions of the patient's history were reviewed and updated as appropriate: allergies, current medications, past family history, past medical history, past social history, past surgical history and problem list.         Past Medical History:  Diagnosis Date  . Abnormal uterine bleeding, postpartum 01/30/2019  . Cardiac murmur 12/17/2018  . Family history of spina bifida 11/15/2014  . Medical history non-contributory   . Sore throat 12/24/2018  . Symptoms of upper respiratory infection (URI) 12/24/2018    Patient Active Problem List   Diagnosis Date Noted  . Abnormal uterine bleeding, postpartum 01/30/2019  . Sore throat 12/24/2018  . Symptoms of upper respiratory infection (URI) 12/24/2018  . Palpitations 12/17/2018  . Cardiac murmur 12/17/2018  . Family history of spina bifida 11/15/2014    Past Surgical History:  Procedure Laterality Date  . DILATION AND EVACUATION N/A 01/30/2019   Procedure: DILATATION AND EVACUATION;  Surgeon: Adam Phenix, MD;  Location: [facility];  Service: Gynecology;  Laterality: N/A;  . NO PAST SURGERIES    . WISDOM TOOTH EXTRACTION      OB History    Gravida  3   Para  3   Term  3   Preterm      AB      Living  3     SAB      IAB      Ectopic      Multiple  0   Live Births  3            Home Medications    Prior to  Admission medications   Medication Sig Start Date End Date Taking? Authorizing Provider  nitrofurantoin, macrocrystal-monohydrate, (MACROBID) 100 MG capsule Take 1 capsule (100 mg total) by mouth 2 (two) times daily. 05/26/20  Yes Lurline Idol, FNP  levonorgestrel (LILETTA, 52 MG,) 19.5 MCG/DAY IUD IUD 1 each by Intrauterine route once.    [provider]  medroxyPROGESTERone (PROVERA) 10 MG tablet Take 1 tablet (10 mg total) by mouth daily. Patient not taking: Reported on 05/26/2020 04/17/19   [name], DO  Multiple Vitamin (MULTIVITAMIN) capsule Take 1 capsule by mouth daily.    [provider]  Prenatal Vit-Fe Fumarate-FA (PRENATAL MULTIVITAMIN) TABS tablet Take 1 tablet by mouth daily at 12 noon.    [provider]    Family History Family History  Problem Relation Age of Onset  . Hypertension Mother   . Thyroid disease Mother   . Diabetes Father   . Hypertension Father   . Cancer Sister        skin cancer  . Cancer Maternal Grandfather        skin  . Cancer Paternal Grandfather        renal  . Spina bifida Paternal Uncle   . Heart attack  Paternal Grandmother   . Heart attack Maternal Aunt   . Heart attack Maternal Aunt   . Heart attack Maternal Aunt   . Stroke Neg Hx   . Miscarriages / Stillbirths Neg Hx   . Mental retardation Neg Hx     Social History Social History   Tobacco Use  . Smoking status: Never Smoker  . Smokeless tobacco: Never Used  Vaping Use  . Vaping Use: Never used  Substance Use Topics  . Alcohol use: No  . Drug use: No     Allergies   Patient has no known allergies.   Review of Systems Review of Systems  Constitutional: Negative for fever.  Gastrointestinal: Negative for nausea and vomiting.  Genitourinary: Positive for frequency. Negative for dysuria and vaginal discharge.  Musculoskeletal: Positive for back pain.  All other systems reviewed and are negative.    Physical Exam Triage Vital  Signs ED Triage Vitals  Enc Vitals Group     BP 05/26/20 0915 125/62     Pulse Rate 05/26/20 0915 93     Resp 05/26/20 0915 20     Temp 05/26/20 0915 98.9 F (37.2 C)     Temp Source 05/26/20 0915 Oral     SpO2 05/26/20 0915 99 %     Weight --      Height --      Head Circumference --      Peak Flow --      Pain Score 05/26/20 0912 7     Pain Loc --      Pain Edu? --      Excl. in [place]? --    No data found.  Updated Vital Signs BP 125/62 (BP Location: Right Arm)   Pulse 93   Temp 98.9 F (37.2 C) (Oral)   Resp 20   SpO2 99%   Visual Acuity Right Eye Distance:   Left Eye Distance:   Bilateral Distance:    Right Eye Near:   Left Eye Near:    Bilateral Near:     Physical Exam Vitals reviewed.  Constitutional:      Appearance: Normal appearance.  HENT:     Head: Normocephalic.  Cardiovascular:     Rate and Rhythm: Normal rate.  Pulmonary:     Effort: Pulmonary effort is normal.  Abdominal:     General: Bowel sounds are normal.     Palpations: Abdomen is soft.     Tenderness: There is no right CVA tenderness or left CVA tenderness.  Musculoskeletal:        General: Normal range of motion.     Cervical back: Normal range of motion and neck supple.  Skin:    General: Skin is warm and dry.  Neurological:     General: No focal deficit present.     Mental Status: She is alert and oriented to person, place, and time.  Psychiatric:        Mood and Affect: Mood normal.        Behavior: Behavior normal.        Thought Content: Thought content normal.        Judgment: Judgment normal.      UC Treatments / Results  Labs (all labs ordered are listed, but only abnormal results are displayed) Labs Reviewed  POCT URINALYSIS DIPSTICK, ED / UC - Abnormal; Notable for the following components:      Result Value   Glucose, UA 100 (*)    Bilirubin Urine SMALL (*)  Protein, ur 30 (*)    Urobilinogen, UA 2.0 (*)    Nitrite POSITIVE (*)    Leukocytes,Ua TRACE (*)     All other components within normal limits  POC URINE PREG, ED    EKG   Radiology No results found.  Procedures Procedures (including critical care time)  Medications Ordered in UC Medications - No data to display  Initial Impression / Assessment and Plan / UC Course  I have reviewed the triage vital signs and the nursing notes.  Pertinent labs & imaging results that were available during my care of the patient were reviewed by me and considered in my medical decision making (see chart for details).    25 year old female presents with a 2-day history of urinary frequency and low back pain.  No fevers, vaginal discharge or dysuria.  Urinalysis positive for nitrates and leukocytes.   Plan:  1. Medications: nitrofurantoin 2. Maintain adequate hydration 3. OK to continue AZO per label instructions  4. Follow up if symptoms not improving, and prn.  Today's evaluation has revealed no signs of a dangerous process. Discussed diagnosis with patient and/or guardian. Patient and/or guardian aware of their diagnosis, possible red flag symptoms to watch out for and need for close follow up. Patient and/or guardian understands verbal and written discharge instructions. Patient and/or guardian comfortable with plan and disposition.  Patient and/or guardian has a clear mental status at this time, good insight into illness (after discussion and teaching) and has clear judgment to make decisions regarding their care  This care was provided during an unprecedented National Emergency due to the Novel Coronavirus (COVID-19) pandemic. COVID-19 infections and transmission risks place heavy strains on healthcare resources.  As this pandemic evolves, our facility, providers, and staff strive to respond fluidly, to remain operational, and to provide care relative to available resources and information. Outcomes are unpredictable and treatments are without well-defined guidelines. Further, the impact of  COVID-19 on all aspects of urgent care, including the impact to patients seeking care for reasons other than COVID-19, is unavoidable during this national emergency. At this time of the global pandemic, management of patients has significantly changed, even for non-COVID positive patients given high local and regional COVID volumes at this time requiring high healthcare system and resource utilization. The standard of care for management of both COVID suspected and non-COVID suspected patients continues to change rapidly at the local, regional, national, and global levels. This patient was worked up and treated to the best available but ever changing evidence and resources available at this current time.   Documentation was completed with the aid of voice recognition software. Transcription may contain typographical errors. Final Clinical Impressions(s) / UC Diagnoses   Final diagnoses:  Acute cystitis without hematuria   Discharge Instructions   None    ED Prescriptions    Medication Sig Dispense Auth. Provider   nitrofurantoin, macrocrystal-monohydrate, (MACROBID) 100 MG capsule Take 1 capsule (100 mg total) by mouth 2 (two) times daily. 10 capsule Lurline Idol, FNP     PDMP not reviewed this encounter.   Lurline Idol, [place] 05/26/20 1005

## 2020-05-26 NOTE — ED Triage Notes (Signed)
On Tuesday, patient had urinary frequency and did not feel like bladder was ever empty.  Did see some blood in urine.  Started taking azo at that time.  Patient has lower back pain

## 2021-03-05 ENCOUNTER — Ambulatory Visit (HOSPITAL_COMMUNITY)
Admission: EM | Admit: 2021-03-05 | Discharge: 2021-03-05 | Disposition: A | Discharge status: Home / Self Care | Payer: Managed Care, Other (non HMO) | Attending: Emergency Medicine | Admitting: Emergency Medicine

## 2021-03-05 ENCOUNTER — Encounter (HOSPITAL_COMMUNITY): Payer: Self-pay | Admitting: *Deleted

## 2021-03-05 ENCOUNTER — Other Ambulatory Visit: Payer: Self-pay

## 2021-03-05 DIAGNOSIS — J029 Acute pharyngitis, unspecified: Secondary | ICD-10-CM | POA: Diagnosis not present

## 2021-03-05 LAB — POCT RAPID STREP A, ED / UC: Streptococcus, Group A Screen (Direct): NEGATIVE

## 2021-03-05 MED ORDER — BENZOCAINE-MENTHOL 15-4 MG MT LOZG: 1.0000 | LOZENGE | Freq: Four times a day (QID) | OROMUCOSAL | 0 refills | Status: DC | PRN

## 2021-03-05 NOTE — ED Triage Notes (Signed)
C/O sore throat and temps around 99 onset 2 days ago. Reports tactile fever yesterday. Has been taking IBU. Denies cough, runny nose. States feels like strep throat. ?

## 2021-03-05 NOTE — Discharge Instructions (Signed)
?  You will be notified of the strep culture results in about 2-3 days. If an antibiotic is indicated, it will be called into your preferred pharmacy.  ?In the meantime, you can try the prescribed throat lozenges to help with throat pain, alternate Tylenol and Motrin and be sure to stay well hydrated, drinking enough clear fluids to keep urine clear to pale yellow in color.  ?Follow up with primary care in 1 week if not improving, sooner if worsening. ?

## 2021-03-05 NOTE — ED Provider Notes (Signed)
?[place] ? ? ? ?CSN: [id number] ?Arrival date & time: 03/05/21  1058 ? ? ?  ? ?History   ?Chief Complaint ?Chief Complaint  ?Patient presents with  ? Sore Throat  ? Fever  ? ? ?HPI ?Sheila Kirby is a 26 y.o. female.  ? ?HPI ?Sheila Kirby is a 26 y.o. female presenting to UC with c/o sore throat that started 2 days ago, associated low grade fever around 99*F.  She recently started a new gym but does not recall being around anyone sick.  Denies known sick contacts at home. Denies cough, congestion, n/v/d. Hx of strep throat and mono in the past.  Pt feels like she has strep throat. She took ibuprofen yesterday for relief but no medications taken today for pain.  ? ? ?Past Medical History:  ?Diagnosis Date  ? Abnormal uterine bleeding, postpartum 01/30/2019  ? Cardiac murmur 12/17/2018  ? Family history of spina bifida 11/15/2014  ? Sore throat 12/24/2018  ? Symptoms of upper respiratory infection (URI) 12/24/2018  ? ? ?Patient Active Problem List  ? Diagnosis Date Noted  ? Abnormal uterine bleeding, postpartum 01/30/2019  ? Sore throat 12/24/2018  ? Symptoms of upper respiratory infection (URI) 12/24/2018  ? Palpitations 12/17/2018  ? Cardiac murmur 12/17/2018  ? Family history of spina bifida 11/15/2014  ? ? ?Past Surgical History:  ?Procedure Laterality Date  ? DILATION AND EVACUATION N/A 01/30/2019  ? Procedure: DILATATION AND EVACUATION;  Surgeon: Woodroe Mode, MD;  Location: [facility];  Service: Gynecology;  Laterality: N/A;  ? WISDOM TOOTH EXTRACTION    ? ? ?OB History   ? ? Gravida  ?3  ? Para  ?3  ? Term  ?3  ? Preterm  ?   ? AB  ?   ? Living  ?3  ?  ? ? SAB  ?   ? IAB  ?   ? Ectopic  ?   ? Multiple  ?0  ? Live Births  ?3  ?   ?  ?  ? ? ? ?Home Medications   ? ?Prior to Admission medications   ?Medication Sig Start Date End Date Taking? Authorizing Provider  ?Benzocaine-Menthol 15-4 MG LOZG Use as directed 1 lozenge in the mouth or throat 4 (four) times daily as  needed. 03/05/21  Yes Chamberlain Steinborn, Bronwen Betters, PA-C  ?levonorgestrel (LILETTA, 52 MG,) 19.5 MCG/DAY IUD IUD 1 each by Intrauterine route once.   Yes [provider]  ?medroxyPROGESTERone (PROVERA) 10 MG tablet Take 1 tablet (10 mg total) by mouth daily. ?Patient not taking: Reported on 05/26/2020 04/17/19   Truett Mainland, DO  ?Multiple Vitamin (MULTIVITAMIN) capsule Take 1 capsule by mouth daily.    [provider]  ?nitrofurantoin, macrocrystal-monohydrate, (MACROBID) 100 MG capsule Take 1 capsule (100 mg total) by mouth 2 (two) times daily. 05/26/20   Enrique Sack, [place]  ?Prenatal Vit-Fe Fumarate-FA (PRENATAL MULTIVITAMIN) TABS tablet Take 1 tablet by mouth daily at 12 noon.    [provider]  ? ? ?Family History ?Family History  ?Problem Relation Age of Onset  ? Hypertension Mother   ? Thyroid disease Mother   ? Diabetes Father   ? Hypertension Father   ? Cancer Sister   ?     skin cancer  ? Cancer Maternal Grandfather   ?     skin  ? Heart attack Paternal Grandmother   ? Cancer Paternal Grandfather   ?  renal  ? Heart attack Maternal Aunt   ? Heart attack Maternal Aunt   ? Heart attack Maternal Aunt   ? Spina bifida Paternal Uncle   ? Stroke Neg Hx   ? Miscarriages / Stillbirths Neg Hx   ? Mental retardation Neg Hx   ? ? ?Social History ?Social History  ? ?Tobacco Use  ? Smoking status: Never  ? Smokeless tobacco: Never  ?Vaping Use  ? Vaping Use: Never used  ?Substance Use Topics  ? Alcohol use: No  ? Drug use: No  ? ? ? ?Allergies   ?Patient has no known allergies. ? ? ?Review of Systems ?Review of Systems  ?Constitutional:  Positive for fever (low grade). Negative for chills.  ?HENT:  Positive for sore throat. Negative for congestion, ear pain, trouble swallowing and voice change.   ?Respiratory:  Negative for cough and shortness of breath.   ?Cardiovascular:  Negative for chest pain and palpitations.  ?Gastrointestinal:  Negative for abdominal pain, diarrhea, nausea and vomiting.   ?Musculoskeletal:  Negative for arthralgias, back pain and myalgias.  ?Skin:  Negative for rash.  ?Neurological:  Negative for dizziness and headaches.  ?All other systems reviewed and are negative. ? ? ?Physical Exam ?Triage Vital Signs ?ED Triage Vitals  ?Enc Vitals Group  ?   BP 03/05/21 1157 120/68  ?   Pulse Rate 03/05/21 1157 86  ?   Resp 03/05/21 1157 18  ?   Temp 03/05/21 1157 98.8 ?F (37.1 ?C)  ?   Temp Source 03/05/21 1157 Oral  ?   SpO2 03/05/21 1157 98 %  ?   Weight --   ?   Height --   ?   Head Circumference --   ?   Peak Flow --   ?   Pain Score 03/05/21 1159 6  ?   Pain Loc --   ?   Pain Edu? --   ?   Excl. in [place]? --   ? ?No data found. ? ?Updated Vital Signs ?BP 120/68   Pulse 86   Temp 98.8 ?F (37.1 ?C) (Oral)   Resp 18   SpO2 98%   Breastfeeding No  ? ?Visual Acuity ?Right Eye Distance:   ?Left Eye Distance:   ?Bilateral Distance:   ? ?Right Eye Near:   ?Left Eye Near:    ?Bilateral Near:    ? ?Physical Exam ?Vitals and nursing note reviewed.  ?Constitutional:   ?   General: She is not in acute distress. ?   Appearance: She is well-developed. She is not ill-appearing, toxic-appearing or diaphoretic.  ?HENT:  ?   Head: Normocephalic and atraumatic.  ?   Right Ear: Tympanic membrane and ear canal normal.  ?   Left Ear: Tympanic membrane and ear canal normal.  ?   Nose: Nose normal.  ?   Right Sinus: No maxillary sinus tenderness or frontal sinus tenderness.  ?   Left Sinus: No maxillary sinus tenderness or frontal sinus tenderness.  ?   Mouth/Throat:  ?   Lips: Pink.  ?   Mouth: Mucous membranes are moist.  ?   Pharynx: Oropharynx is clear. Uvula midline. Posterior oropharyngeal erythema present. No pharyngeal swelling, oropharyngeal exudate or uvula swelling.  ?   Tonsils: No tonsillar exudate or tonsillar abscesses.  ?Cardiovascular:  ?   Rate and Rhythm: Normal rate and regular rhythm.  ?Pulmonary:  ?   Effort: Pulmonary effort is normal. No respiratory distress.  ?   Breath  sounds: Normal  breath sounds. No stridor. No wheezing, rhonchi or rales.  ?Musculoskeletal:     ?   General: Normal range of motion.  ?   Cervical back: Normal range of motion and neck supple.  ?Lymphadenopathy:  ?   Cervical: Cervical adenopathy present.  ?Skin: ?   General: Skin is warm and dry.  ?Neurological:  ?   Mental Status: She is alert and oriented to person, place, and time.  ?Psychiatric:     ?   Behavior: Behavior normal.  ? ? ? ?UC Treatments / Results  ?Labs ?(all labs ordered are listed, but only abnormal results are displayed) ?Labs Reviewed  ?CULTURE, GROUP A STREP [facility])  ?POCT RAPID STREP A, ED / UC  ? ? ?EKG ? ? ?Radiology ?No results found. ? ?Procedures ?Procedures (including critical care time) ? ?Medications Ordered in UC ?Medications - No data to display ? ?Initial Impression / Assessment and Plan / UC Course  ?I have reviewed the triage vital signs and the nursing notes. ? ?Pertinent labs & imaging results that were available during my care of the patient were reviewed by me and considered in my medical decision making (see chart for details). ? ?  ? ?Rapid strep: NEGATIVE ?Culture pending. ?Discussed home treatment. ?F/u with PCP in 1 week if not improving, sooner if worsening. ?AVS provided. ? ?Final Clinical Impressions(s) / UC Diagnoses  ? ?Final diagnoses:  ?Acute pharyngitis, unspecified etiology  ? ? ? ?Discharge Instructions   ? ?  ? ?You will be notified of the strep culture results in about 2-3 days. If an antibiotic is indicated, it will be called into your preferred pharmacy.  ?In the meantime, you can try the prescribed throat lozenges to help with throat pain, alternate Tylenol and Motrin and be sure to stay well hydrated, drinking enough clear fluids to keep urine clear to pale yellow in color.  ?Follow up with primary care in 1 week if not improving, sooner if worsening. ? ? ? ? ?ED Prescriptions   ? ? Medication Sig Dispense Auth. Provider  ? Benzocaine-Menthol 15-4 MG LOZG Use as  directed 1 lozenge in the mouth or throat 4 (four) times daily as needed. 9 lozenge Noe Gens, [place]  ? ?  ? ?PDMP not reviewed this encounter. ?  ?Noe Gens, PA-C ?03/05/21 1305 ? ?

## 2021-03-07 ENCOUNTER — Telehealth (HOSPITAL_COMMUNITY): Payer: Self-pay | Admitting: Emergency Medicine

## 2021-03-07 LAB — CULTURE, GROUP A STREP (THRC)

## 2021-03-07 MED ORDER — AMOXICILLIN 500 MG PO CAPS: 500.0000 mg | ORAL_CAPSULE | Freq: Two times a day (BID) | ORAL | 0 refills | Status: AC

## 2021-06-05 ENCOUNTER — Encounter: Payer: Self-pay | Admitting: Family Medicine

## 2021-06-14 ENCOUNTER — Ambulatory Visit: Payer: Managed Care, Other (non HMO) | Admitting: Family Medicine

## 2021-06-14 ENCOUNTER — Encounter: Payer: Self-pay | Admitting: Family Medicine

## 2021-06-14 VITALS — BP 100/57 | HR 84 | Ht 69.0 in | Wt 203.0 lb

## 2021-06-14 DIAGNOSIS — N921 Excessive and frequent menstruation with irregular cycle: Secondary | ICD-10-CM

## 2021-06-14 DIAGNOSIS — Z975 Presence of (intrauterine) contraceptive device: Secondary | ICD-10-CM

## 2021-06-14 DIAGNOSIS — Z30432 Encounter for removal of intrauterine contraceptive device: Secondary | ICD-10-CM

## 2021-06-14 NOTE — Progress Notes (Signed)
Patient presents for breakthrough bleeding IUD.  Patient has had 2-3 episodes.  20 days of spotting over the past 3 months or so.  Patient would like to remove the IUD.  Discussed options, but patient medical.  IUD Removal  Patient was in the dorsal lithotomy position, normal external genitalia was noted.  A speculum was placed in the patient's vagina, normal discharge was noted, no lesions. The multiparous cervix was visualized, no lesions, no abnormal discharge,  and was swabbed with Betadine using scopettes.  The strings of the IUD was grasped and pulled using ring forceps.  The IUD was successfully removed in its entirety.  Patient tolerated the procedure well.

## 2021-06-14 NOTE — Progress Notes (Signed)
IUD string check.  Second time patient has had 20 days of bleeding. [name] RN

## 2021-08-24 ENCOUNTER — Ambulatory Visit: Payer: Managed Care, Other (non HMO) | Admitting: Family Medicine

## 2021-10-05 IMAGING — CT CT ANGIO CHEST
2 of 7 series · 17 of 46 positions shown · IV contrast (omnipaque)
Comparison: Chest radiograph from earlier today.

CLINICAL DATA: 23-year-old pregnant female at 32 weeks gestational
age, presenting with dyspnea and dizziness.

EXAM:
CT ANGIOGRAPHY CHEST WITH CONTRAST
TECHNIQUE: Multidetector CT imaging of the chest was performed using the
standard protocol during bolus administration of intravenous
contrast. Multiplanar CT image reconstructions and MIPs were
obtained to evaluate the vascular anatomy.
CONTRAST:  75mL OMNIPAQUE IOHEXOL 350 MG/ML SOLN

[Series 8: thins · axial · 0.84mm/px · z∈[+6,+222]mm · 14 of 348 slices shown]
[im 20/348  lung]
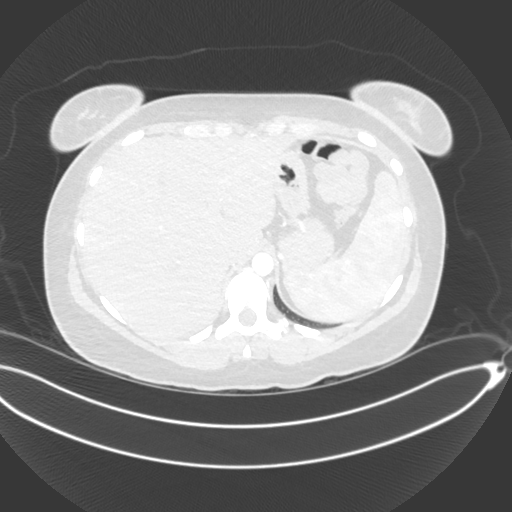
[im 39/348  soft-tissue]
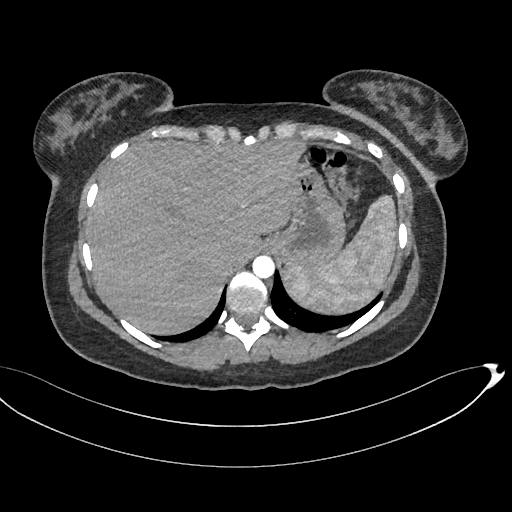
[im 78/348  lung]
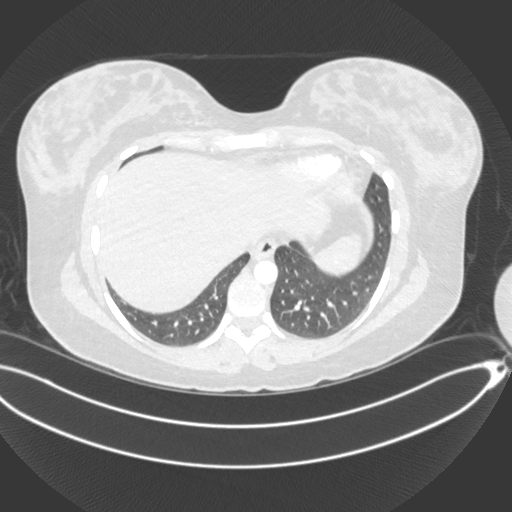
[im 97/348  soft-tissue]
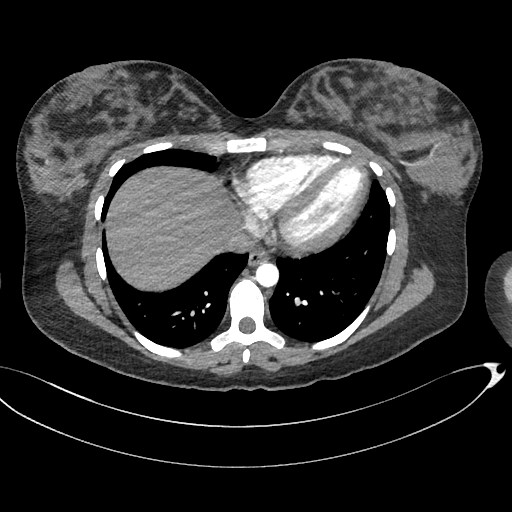
[im 116/348  lung]
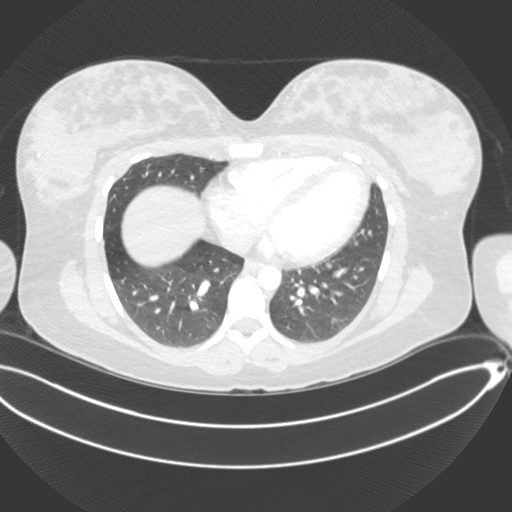
[im 135/348  soft-tissue]
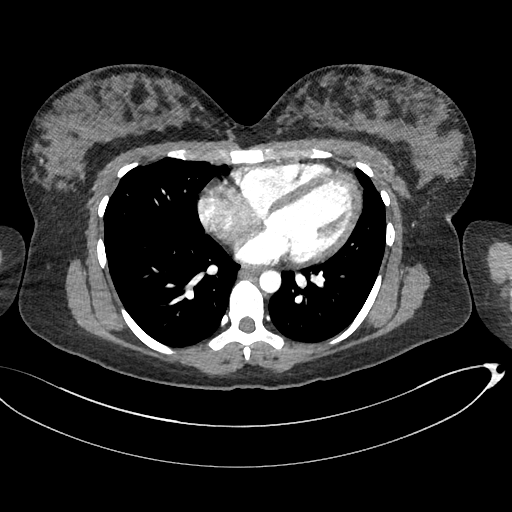
[im 155/348  lung]
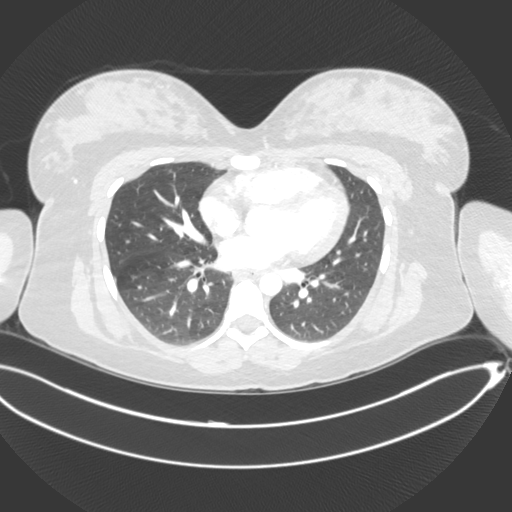
[im 193/348  soft-tissue]
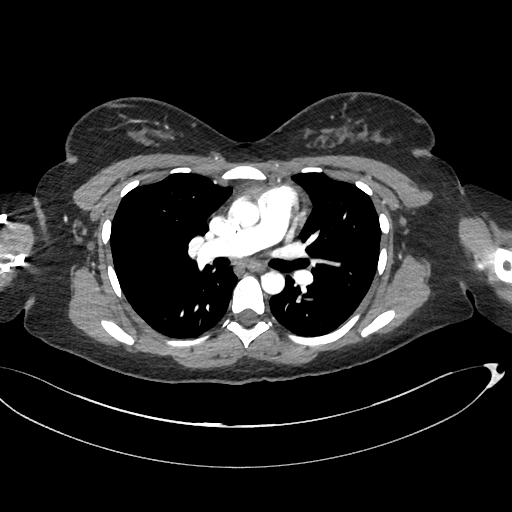
[im 213/348  lung]
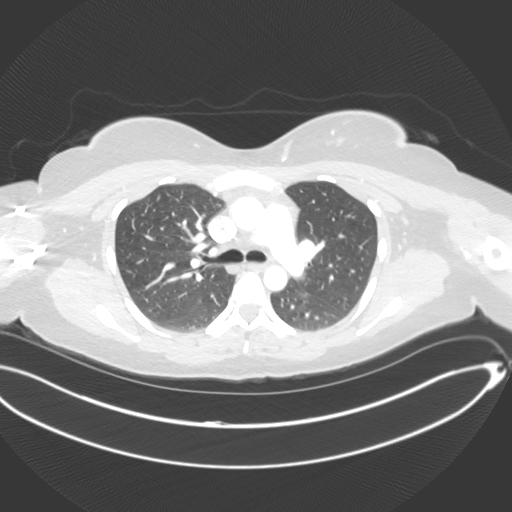
[im 232/348  soft-tissue]
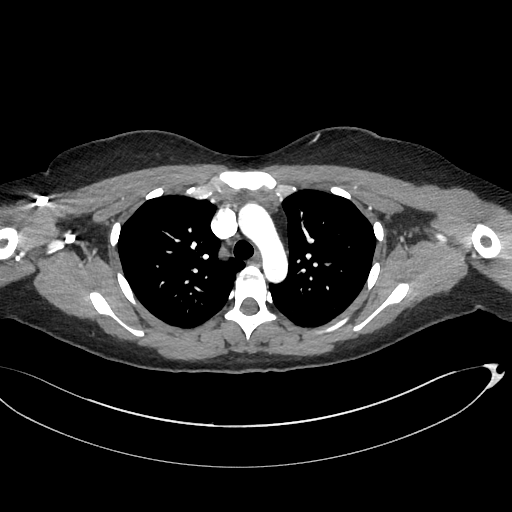
[im 251/348  lung]
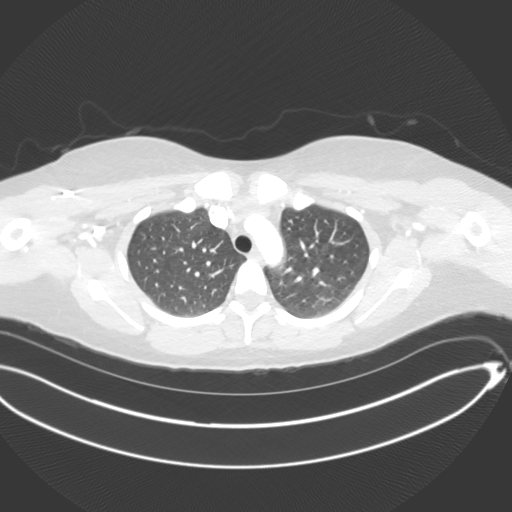
[im 270/348  soft-tissue]
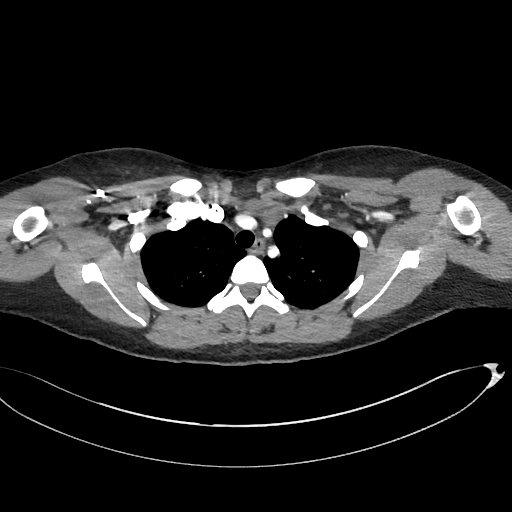
[im 309/348  lung]
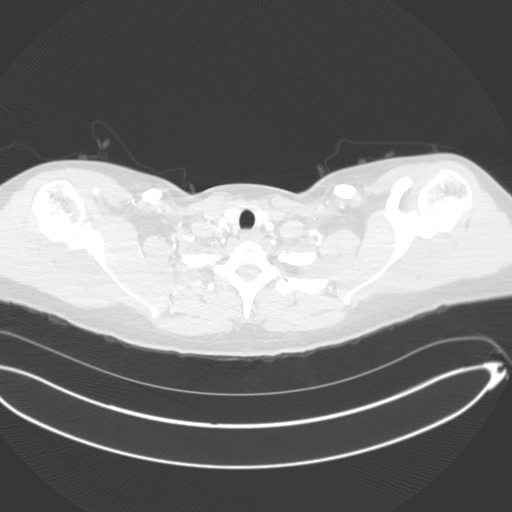
[im 328/348  soft-tissue]
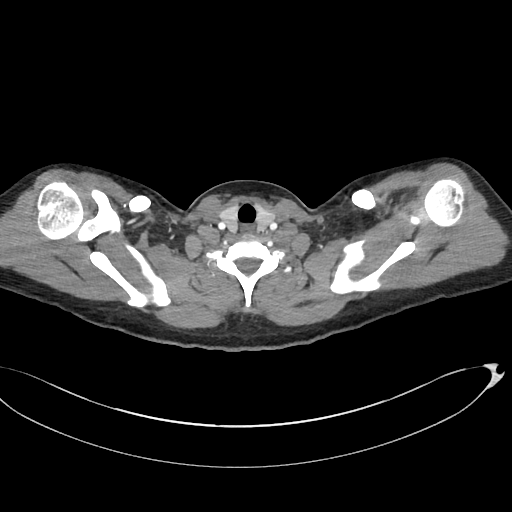

[Series 9: cor · coronal · 0.48mm/px · 3 of 146 slices shown]
[im 37/146  soft-tissue]
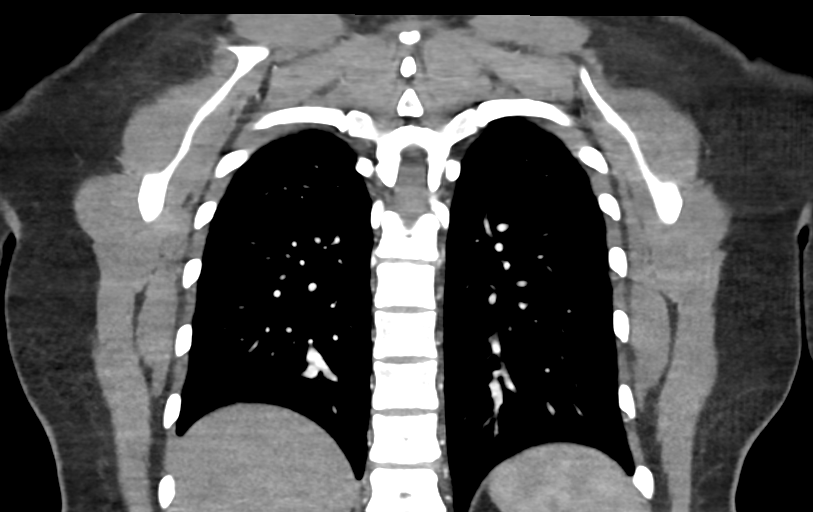
[im 73/146  soft-tissue]
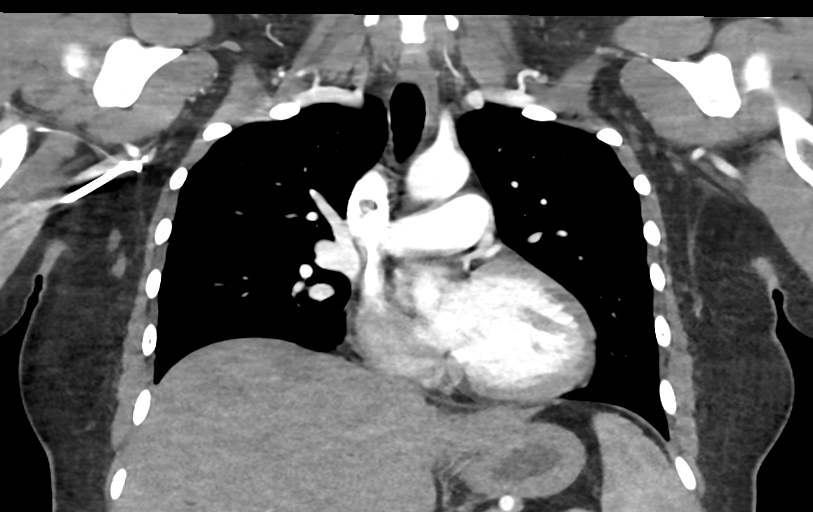
[im 109/146  soft-tissue]
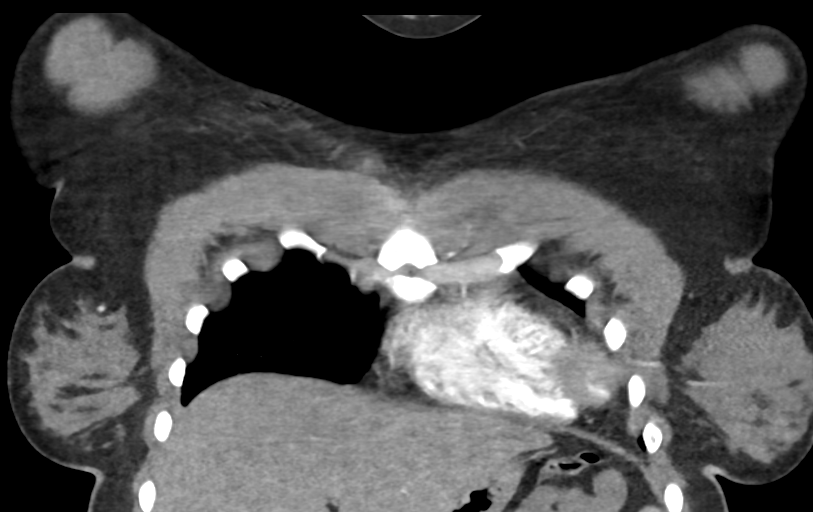

[17 of 46 positions shown; findings below may reference images not displayed]

FINDINGS: Cardiovascular: The study is moderate to high quality for the
evaluation of pulmonary embolism, with some motion degradation.
There are no filling defects in the central, lobar, segmental or
subsegmental pulmonary artery branches to suggest acute pulmonary
embolism. Great vessels are normal in course and caliber. Top-normal
heart size. No significant pericardial fluid/thickening.

Mediastinum/Nodes: No discrete thyroid nodules. Unremarkable
esophagus. No pathologically enlarged axillary, mediastinal or hilar
lymph nodes.

Lungs/Pleura: No pneumothorax. No pleural effusion. No acute
consolidative airspace disease, lung masses or significant pulmonary
nodules.

Upper abdomen: No acute abnormality.

Musculoskeletal:  No aggressive appearing focal osseous lesions.

Review of the MIP images confirms the above findings.
IMPRESSION: Normal chest CT.  No pulmonary embolism.

## 2022-07-27 LAB — OB RESULTS CONSOLE HEPATITIS B SURFACE ANTIGEN: Hepatitis B Surface Ag: NEGATIVE

## 2022-07-27 LAB — HEPATITIS C ANTIBODY: HCV Ab: NEGATIVE

## 2022-07-30 LAB — OB RESULTS CONSOLE RUBELLA ANTIBODY, IGM: Rubella: IMMUNE

## 2022-08-23 ENCOUNTER — Ambulatory Visit (INDEPENDENT_AMBULATORY_CARE_PROVIDER_SITE_OTHER): Payer: Managed Care, Other (non HMO) | Admitting: Family Medicine

## 2022-08-23 ENCOUNTER — Encounter: Payer: Self-pay | Admitting: Family Medicine

## 2022-08-23 VITALS — Wt 218.0 lb

## 2022-08-23 DIAGNOSIS — Z1339 Encounter for screening examination for other mental health and behavioral disorders: Secondary | ICD-10-CM | POA: Diagnosis not present

## 2022-08-23 DIAGNOSIS — Z3A12 12 weeks gestation of pregnancy: Secondary | ICD-10-CM | POA: Diagnosis not present

## 2022-08-23 DIAGNOSIS — Z3481 Encounter for supervision of other normal pregnancy, first trimester: Secondary | ICD-10-CM | POA: Diagnosis not present

## 2022-08-23 DIAGNOSIS — Z3480 Encounter for supervision of other normal pregnancy, unspecified trimester: Secondary | ICD-10-CM | POA: Insufficient documentation

## 2022-08-23 NOTE — Progress Notes (Addendum)
Subjective:  Sheila Kirby is a [id number] [redacted]w[redacted]d by LMP consistent with first trimester [place] - being seen today for her first obstetrical visit. She did have an initial appointment with an [facility], because of some spotting but prefers to come here.  Her obstetrical history is significant for  three prior vaginal deliveries. No obstetrical complications. No medical complications . Patient does intend to breast feed. Pregnancy history fully reviewed.  Patient reports nausea.  Wt 218 lb (98.9 kg)   LMP 05/21/2022   BMI 32.19 kg/m   HISTORY: OB History  Gravida Para Term Preterm AB Living  4 3 3     3   SAB IAB Ectopic Multiple Live Births        0 3    # Outcome Date GA Lbr Len/2nd Weight Sex Type Anes PTL Lv  4 Current           3 Term 01/13/19 [redacted]w[redacted]d 02:55 / 00:01 8 lb 6.6 oz (3.815 kg) M Vag-Spont None  LIV  2 Term 11/06/17 [redacted]w[redacted]d 03:55 / 01:44 7 lb 10.9 oz (3.485 kg) M Vag-Spont EPI  LIV  1 Term 06/23/15 [redacted]w[redacted]d 03:15 / 01:38 7 lb 3.5 oz (3.274 kg) F Vag-Spont EPI  LIV    Past Medical History:  Diagnosis Date   Abnormal uterine bleeding, postpartum 01/30/2019   Cardiac murmur 12/17/2018   Family history of spina bifida 11/15/2014   Sore throat 12/24/2018   Symptoms of upper respiratory infection (URI) 12/24/2018    Past Surgical History:  Procedure Laterality Date   DILATION AND EVACUATION N/A 01/30/2019   Procedure: DILATATION AND EVACUATION;  Surgeon: Sheila Phenix, MD;  Location: [facility];  Service: Gynecology;  Laterality: N/A;   WISDOM TOOTH EXTRACTION      Family History  Problem Relation Age of Onset   Hypertension Mother    Thyroid disease Mother    Diabetes Father    Hypertension Father    Cancer Sister        skin cancer   Cancer Maternal Grandfather        skin   Heart attack Paternal Grandmother    Cancer Paternal Grandfather        renal   Heart attack Maternal Aunt    Heart attack Maternal Aunt    Heart attack Maternal Aunt     Spina bifida Paternal Uncle    Stroke Neg Hx    Miscarriages / Stillbirths Neg Hx    Mental retardation Neg Hx      Exam  Wt 218 lb (98.9 kg)   LMP 05/21/2022   BMI 32.19 kg/m   Chaperone present during exam  CONSTITUTIONAL: Well-developed, well-nourished female in no acute distress.  HENT:  Normocephalic, atraumatic, External right and left ear normal. Oropharynx is clear and moist EYES: Conjunctivae and EOM are normal. Pupils are equal, round, and reactive to light. No scleral icterus.  NECK: Normal range of motion, supple, no masses.  Normal thyroid.  CARDIOVASCULAR: Normal heart rate noted, regular rhythm RESPIRATORY: Clear to auscultation bilaterally. Effort and breath sounds normal, no problems with respiration noted. BREASTS: Symmetric in size. No masses, skin changes, nipple drainage, or lymphadenopathy. ABDOMEN: Soft, normal bowel sounds, no distention noted.  No tenderness, rebound or guarding.  PELVIC: Normal appearing external genitalia; normal appearing vaginal mucosa and cervix. No abnormal discharge noted. Normal uterine size, no other palpable masses, no uterine or adnexal tenderness. MUSCULOSKELETAL: Normal range of motion. No tenderness.  No cyanosis,  clubbing, or edema.  2+ distal pulses. SKIN: Skin is warm and dry. No rash noted. Not diaphoretic. No erythema. No pallor. NEUROLOGIC: Alert and oriented to person, place, and time. Normal reflexes, muscle tone coordination. No cranial nerve deficit noted. PSYCHIATRIC: Normal mood and affect. Normal behavior. Normal judgment and thought content.    Assessment:    Pregnancy: [id number] Patient Active Problem List   Diagnosis Date Noted   Supervision of other normal pregnancy, antepartum 08/23/2022   Abnormal uterine bleeding, postpartum 01/30/2019   Sore throat 12/24/2018   Symptoms of upper respiratory infection (URI) 12/24/2018   Palpitations 12/17/2018   Cardiac murmur 12/17/2018   Family history of spina  bifida 11/15/2014      Plan:   1. Supervision of other normal pregnancy, antepartum Recommended doxylamine and Vit B6 - Culture, OB Urine - [place] MFM OB DETAIL +14 WK; Future - Hemoglobin A1c - [facility] - PANORAMA PRENATAL TEST - [facility] AMB BABYSCRIPTS OPT IN    Initial labs obtained Continue prenatal vitamins Reviewed n/v relief measures and warning s/s to report Reviewed recommended weight gain based on pre-gravid BMI Encouraged well-balanced diet Genetic & carrier screening discussed: requests Panorama and [facility] ,  Ultrasound discussed; fetal survey: requested CCNC completed> form faxed if has or is planning to apply for medicaid The nature of [facility] [employer] with multiple MDs and other Advanced Practice Providers was explained to patient; also emphasized that fellows, residents, and students are part of our team.  No indications for ASA 81mg  or early GDM screening   Problem list reviewed and updated. 75% of 30 min visit spent on counseling and coordination of care.    Sheila Kirby 08/23/2022

## 2022-08-24 LAB — HEMOGLOBIN A1C
Est. average glucose Bld gHb Est-mCnc: 97 mg/dL
Hgb A1c MFr Bld: 5 % (ref 4.8–5.6)

## 2022-08-25 LAB — URINE CULTURE, OB REFLEX

## 2022-08-25 LAB — CULTURE, OB URINE

## 2022-08-31 LAB — PANORAMA PRENATAL TEST FULL PANEL:PANORAMA TEST PLUS 5 ADDITIONAL MICRODELETIONS: FETAL FRACTION: 5

## 2022-08-31 LAB — HORIZON CUSTOM: REPORT SUMMARY: NEGATIVE

## 2022-09-03 ENCOUNTER — Encounter: Payer: Self-pay | Admitting: Family Medicine

## 2022-09-06 ENCOUNTER — Encounter (HOSPITAL_COMMUNITY): Payer: Self-pay | Admitting: Obstetrics & Gynecology

## 2022-09-06 ENCOUNTER — Inpatient Hospital Stay (HOSPITAL_COMMUNITY): Payer: Managed Care, Other (non HMO)

## 2022-09-06 ENCOUNTER — Inpatient Hospital Stay (HOSPITAL_COMMUNITY)
Admission: AD | Admit: 2022-09-06 | Discharge: 2022-09-06 | Disposition: A | Discharge status: Home / Self Care | Payer: Managed Care, Other (non HMO) | Attending: Obstetrics & Gynecology | Admitting: Obstetrics & Gynecology

## 2022-09-06 DIAGNOSIS — O4692 Antepartum hemorrhage, unspecified, second trimester: Secondary | ICD-10-CM

## 2022-09-06 DIAGNOSIS — Z3A14 14 weeks gestation of pregnancy: Secondary | ICD-10-CM | POA: Diagnosis not present

## 2022-09-06 DIAGNOSIS — M545 Low back pain, unspecified: Secondary | ICD-10-CM | POA: Insufficient documentation

## 2022-09-06 DIAGNOSIS — O418X20 Other specified disorders of amniotic fluid and membranes, second trimester, not applicable or unspecified: Secondary | ICD-10-CM

## 2022-09-06 DIAGNOSIS — O26852 Spotting complicating pregnancy, second trimester: Secondary | ICD-10-CM | POA: Diagnosis present

## 2022-09-06 DIAGNOSIS — O99891 Other specified diseases and conditions complicating pregnancy: Secondary | ICD-10-CM | POA: Diagnosis not present

## 2022-09-06 DIAGNOSIS — M549 Dorsalgia, unspecified: Secondary | ICD-10-CM

## 2022-09-06 LAB — URINALYSIS, ROUTINE W REFLEX MICROSCOPIC
Bilirubin Urine: NEGATIVE
Glucose, UA: NEGATIVE mg/dL
Ketones, ur: NEGATIVE mg/dL
Leukocytes,Ua: NEGATIVE
Nitrite: NEGATIVE
Protein, ur: NEGATIVE mg/dL
Specific Gravity, Urine: 1.006 (ref 1.005–1.030)
pH: 6 (ref 5.0–8.0)

## 2022-09-06 LAB — WET PREP, GENITAL
Clue Cells Wet Prep HPF POC: NONE SEEN
Sperm: NONE SEEN
Trich, Wet Prep: NONE SEEN
WBC, Wet Prep HPF POC: <10 (ref <10)
Yeast Wet Prep HPF POC: NONE SEEN

## 2022-09-06 NOTE — Discharge Instructions (Signed)
Return to MAU: If you have heavier bleeding that soaks through more that 2 pads per hour for an hour or more If you bleed so much that you feel like you might pass out or you do pass out If you have significant abdominal pain that is not improved with Tylenol 1000 mg every 8 hours as needed for pain If you develop a fever > 100.5  

## 2022-09-06 NOTE — MAU Provider Note (Addendum)
History     CSN: [id number]  Arrival date and time: 09/06/22 1415   Event Date/Time   First Provider Initiated Contact with Patient 09/06/22 1658      Chief Complaint  Patient presents with   Back Pain   Vaginal Bleeding   HPI Ms. Sheila Kirby is a 27 y.o. year old [id number] female at [redacted]w[redacted]d weeks gestation who presents to MAU reporting vaginal spotting off and on x 2-3 weeks. She reports the spotting has "not been everyday, but 3-4 days out of the week.." She reports that today is the 3rd day in a row of red spotting. She reports she had [facility] with her past 3 pregnancies. She also reports lower back pain; rated 3/10. She last had SI this past weekend. She receives [facility] with [facility]; next appt is 09/21/2022.    OB History     Gravida  4   Para  3   Term  3   Preterm      AB      Living  3      SAB      IAB      Ectopic      Multiple  0   Live Births  3           Past Medical History:  Diagnosis Date   Abnormal uterine bleeding, postpartum 01/30/2019   Cardiac murmur 12/17/2018   Family history of spina bifida 11/15/2014   Sore throat 12/24/2018   Symptoms of upper respiratory infection (URI) 12/24/2018    Past Surgical History:  Procedure Laterality Date   DILATION AND EVACUATION N/A 01/30/2019   Procedure: DILATATION AND EVACUATION;  Surgeon: Adam Phenix, MD;  Location: [facility];  Service: Gynecology;  Laterality: N/A;   WISDOM TOOTH EXTRACTION      Family History  Problem Relation Age of Onset   Hypertension Mother    Thyroid disease Mother    Diabetes Father    Hypertension Father    Cancer Sister        skin cancer   Cancer Maternal Grandfather        skin   Heart attack Paternal Grandmother    Cancer Paternal Grandfather        renal   Heart attack Maternal Aunt    Heart attack Maternal Aunt    Heart attack Maternal Aunt    Spina bifida Paternal Uncle    Stroke Neg Hx    Miscarriages / Stillbirths Neg Hx     Mental retardation Neg Hx     Social History   Tobacco Use   Smoking status: Never   Smokeless tobacco: Never  Vaping Use   Vaping status: Never Used  Substance Use Topics   Alcohol use: No   Drug use: No    Allergies: No Known Allergies  Medications Prior to Admission  Medication Sig Dispense Refill Last Dose   Benzocaine-Menthol 15-4 MG LOZG Use as directed 1 lozenge in the mouth or throat 4 (four) times daily as needed. (Patient not taking: Reported on 06/14/2021) 9 lozenge 0    Multiple Vitamin (MULTIVITAMIN) capsule Take 1 capsule by mouth daily. (Patient not taking: Reported on 06/14/2021)      Prenatal Vit-Fe Fumarate-FA (PRENATAL MULTIVITAMIN) TABS tablet Take 1 tablet by mouth daily at 12 noon. (Patient not taking: Reported on 06/14/2021)       Review of Systems  Constitutional: Negative.   HENT: Negative.    Eyes: Negative.  Respiratory: Negative.    Cardiovascular: Negative.   Gastrointestinal: Negative.   Endocrine: Negative.   Genitourinary:  Positive for vaginal bleeding (spotting happens 3-4 dyas out of the week).  Musculoskeletal:  Positive for back pain.  Skin: Negative.   Allergic/Immunologic: Negative.   Neurological: Negative.   Hematological: Negative.   Psychiatric/Behavioral: Negative.     Physical Exam   Blood pressure 131/75, pulse (!) 103, temperature 98.5 F (36.9 C), temperature source Oral, resp. rate 18, height 5\' 9"  (1.753 m), weight 99.4 kg, last menstrual period 05/21/2022, SpO2 98%.  Physical Exam Vitals and nursing note reviewed.  Constitutional:      Appearance: Normal appearance. She is obese.  Pulmonary:     Effort: Pulmonary effort is normal.  Genitourinary:    Comments: Swabs collected by patient using blind swab technique  Musculoskeletal:        General: Normal range of motion.  Skin:    General: Skin is warm and dry.  Neurological:     Mental Status: She is alert and oriented to person, place, and time.  Psychiatric:         Mood and Affect: Mood normal.        Behavior: Behavior normal.        Thought Content: Thought content normal.        Judgment: Judgment normal.    FHTs by doppler: 161 bpm  MAU Course  Procedures  MDM CC[facility] Wet Prep GC/CT -- Results pending  OB>14 wks U/S  Results for orders placed or performed during the hospital encounter of 09/06/22 (from the past 24 hour(s))  Urinalysis, Routine w reflex microscopic -Urine, Clean Catch     Status: Abnormal   Collection Time: 09/06/22  3:57 PM  Result Value Ref Range   Color, Urine STRAW (A) YELLOW   APPearance CLEAR CLEAR   Specific Gravity, Urine 1.006 1.005 - 1.030   pH 6.0 5.0 - 8.0   Glucose, UA NEGATIVE NEGATIVE mg/dL   Hgb urine dipstick SMALL (A) NEGATIVE   Bilirubin Urine NEGATIVE NEGATIVE   Ketones, ur NEGATIVE NEGATIVE mg/dL   Protein, ur NEGATIVE NEGATIVE mg/dL   Nitrite NEGATIVE NEGATIVE   Leukocytes,Ua NEGATIVE NEGATIVE   RBC / HPF 0-5 0 - 5 RBC/hpf   WBC, UA 0-5 0 - 5 WBC/hpf   Bacteria, UA RARE (A) NONE SEEN   Squamous Epithelial / HPF 0-5 0 - 5 /HPF  Wet prep, genital     Status: None   Collection Time: 09/06/22  3:57 PM   Specimen: PATH Cytology Cervicovaginal Ancillary Only  Result Value Ref Range   Yeast Wet Prep HPF POC NONE SEEN NONE SEEN   Trich, Wet Prep NONE SEEN NONE SEEN   Clue Cells Wet Prep HPF POC NONE SEEN NONE SEEN   WBC, Wet Prep HPF POC <10 <10   Sperm NONE SEEN   [place] MFM OB LIMITED  Result Date: 09/06/2022 ----------------------------------------------------------------------  OBSTETRICS REPORT                       (Signed Final 09/06/2022 06:39 pm) ---------------------------------------------------------------------- Patient Info  ID #:       [id number]                          D.O.B.:  [birth year] (27 yrs)  Name:       Sheila Kirby  Visit Date: 09/06/2022 06:11 pm ---------------------------------------------------------------------- Performed By  Attending:        Noralee Space  MD        Ref. Address:      [address]                                                              [place], [place]                                                              [address]  Performed By:     Marcellina Millin          Secondary Phy.:    [facility] MAU/Triage                    RDMS  Referred By:      Dia Sitter                [facility]                    Ade[facility] Pask CNM                                [facility] ---------------------------------------------------------------------- Orders  #  Description                           Code        Ordered By  1  [place] MFM OB LIMITED                     21308.65    HQIONGE Maxten Shuler ----------------------------------------------------------------------  #  Order #                     Accession #                Episode #  1  [id number]                   [id number]                 [id number] ---------------------------------------------------------------------- Indications  Vaginal bleeding in pregnancy, second           O46.92  trimester  [redacted] weeks gestation of pregnancy                 Z3A.14 ---------------------------------------------------------------------- Fetal Evaluation  Num Of Fetuses:          1  Fetal Heart Rate(bpm):   148  Cardiac Activity:        Observed  Presentation:            Cephalic  Placenta:                Posterior; see comments  Amniotic Fluid  AFI FV:      Within normal limits  Largest Pocket(cm)                              2. ---------------------------------------------------------------------- OB History  Gravidity:    4         Term:   3        Prem:   0        SAB:   0  TOP:          0       Ectopic:  0        Living: 3 ---------------------------------------------------------------------- Gestational Age  Best:          14w 1d     Det. ByMarcella Dubs         EDD:   03/06/23                                      (07/27/22) ----------------------------------------------------------------------  Anatomy  Cranium:               Appears normal         Abdomen:                Appears normal  Choroid Plexus:        Appears normal         Abdominal Wall:         Appears nml (cord                                                                        insert, abd wall)  Stomach:               Appears normal, left   Bladder:                Appears normal                         sided ---------------------------------------------------------------------- Cervix Uterus Adnexa  Cervix  Closed  Uterus  No abnormality visualized.  Right Ovary  Within normal limits.  Left Ovary  Not visualized.  Adnexa  No abnormality visualized ---------------------------------------------------------------------- Impression  Patient is being evaluated at the MAU for c/o vaginal bleeding  (spotting).  A limited ultrasound study was performed. Amniotic fluid is  normal and good fetal activity is seen.  Placenta is posterior and there is a small echolucent well  circumscribed area in the inferior edge of the placenta and it  measures 3.1 x 2.7 x 1.1 cm. It has the appearance of a  placental lake and difficult to differentiate from a subchorionic  hemorrhage. Rest of the placenta appears normal. No  evidence of previa or low-lying placenta.  Recommend reassessment in 4 to 5 weeks at anatomy scan. ----------------------------------------------------------------------                 Noralee Space, MD Electronically Signed Final Report   09/06/2022 06:39 pm ----------------------------------------------------------------------    Assessment and Plan  1. Vaginal bleeding in pregnancy, second trimester - Information provided on vaginal bleeding in pregnancy, 2nd trimester  - Return to MAU: If you  have heavier bleeding that soaks through more that 2 pads per hour for an hour or more If you bleed so much that you feel like you might pass out or you do pass out If you have significant abdominal pain that is not improved with Tylenol 1000 mg  every 8 hours as needed for pain If you develop a fever > 100.5   2. Subchorionic hematoma in second trimester, single or unspecified fetus [facility] vs Placental lake per Dr. Judeth Cornfield - Information provided on [facility]   3. Back pain affecting pregnancy in second trimester - Information provided on back pain   4. [redacted] weeks gestation of pregnancy   - Discharge patient - Keep scheduled appt [facility] 09/21/2022 - Patient verbalized an understanding of the plan of care and agrees.  Raelyn Mora, CNM 09/06/2022, 4:58 PM

## 2022-09-06 NOTE — MAU Note (Signed)
Sheila Kirby is a 27 y.o. at [redacted]w[redacted]d here in MAU reporting: has had consistent spotting last 2-3 wks.  Today is the third day in a row. Red spotting. Lower back is hurting, started today  Onset of complaint: ongoing Pain score: 3 Vitals:   09/06/22 1529  BP: 131/75  Pulse: (!) 103  Resp: 18  Temp: 98.5 F (36.9 C)  SpO2: 98%     FHT:161 Lab orders placed from triage:  urine, vag swabs

## 2022-09-07 LAB — GC/CHLAMYDIA PROBE AMP (~~LOC~~) NOT AT ARMC
Chlamydia: NEGATIVE
Comment: NEGATIVE
Comment: NORMAL
Neisseria Gonorrhea: NEGATIVE

## 2022-09-21 ENCOUNTER — Ambulatory Visit (INDEPENDENT_AMBULATORY_CARE_PROVIDER_SITE_OTHER): Payer: Managed Care, Other (non HMO) | Admitting: Obstetrics and Gynecology

## 2022-09-21 VITALS — BP 124/62 | HR 83 | Wt 220.0 lb

## 2022-09-21 DIAGNOSIS — Z3480 Encounter for supervision of other normal pregnancy, unspecified trimester: Secondary | ICD-10-CM

## 2022-09-21 DIAGNOSIS — R21 Rash and other nonspecific skin eruption: Secondary | ICD-10-CM

## 2022-09-21 DIAGNOSIS — Z3A16 16 weeks gestation of pregnancy: Secondary | ICD-10-CM

## 2022-09-21 NOTE — Progress Notes (Signed)
   PRENATAL VISIT NOTE  Subjective:  Sheila Kirby is a 27 y.o. [phone] at [redacted]w[redacted]d being seen today for ongoing prenatal care.  She is currently monitored for the following issues for this low-risk pregnancy and has Family history of spina bifida; Palpitations; Cardiac murmur; Sore throat; Symptoms of upper respiratory infection (URI); Abnormal uterine bleeding, postpartum; and Supervision of other normal pregnancy, antepartum on their problem list.  Patient reports  rash .  Contractions: Not present. Vag. Bleeding: None.  Movement: Absent. Denies leaking of fluid.   States rash all over, not including palms or soles of feet. Has not had in prior pregnancy. Has been using hydrocortisone cream and benadryl.   The following portions of the patient's history were reviewed and updated as appropriate: allergies, current medications, past family history, past medical history, past social history, past surgical history and problem list.   Objective:   Vitals:   09/21/22 0846 09/21/22 0859  BP: (!) 116/44 124/62  Pulse: 79 83  Weight: 220 lb (99.8 kg)     Fetal Status: Fetal Heart Rate (bpm): 136   Movement: Absent     General:  Alert, oriented and cooperative. Patient is in no acute distress.  Skin: Skin is warm and dry. No rash noted.   Cardiovascular: Normal heart rate noted  Respiratory: Normal respiratory effort, no problems with respiration noted  Abdomen: Soft, gravid, appropriate for gestational age.  Pain/Pressure: Absent     Pelvic: Cervical exam deferred        Extremities: Normal range of motion.  Edema: None  Mental Status: Normal mood and affect. Normal behavior. Normal judgment and thought content.   Assessment and Plan:  Pregnancy: G4P3003 at [redacted]w[redacted]d 1. [redacted] weeks gestation of pregnancy FHR wnl  2. Supervision of other normal pregnancy, antepartum Declines msAFP - would prefer to just see what is shown on anatomy scan   3. Rash Offered hydroxyzine, but otherwise  continue benadryl, allergy pills and topica hydrocortisone   Preterm labor symptoms and general obstetric precautions including but not limited to vaginal bleeding, contractions, leaking of fluid and fetal movement were reviewed in detail with the patient. Please refer to After Visit Summary for other counseling recommendations.   No follow-ups on file.  Future Appointments  Date Time Provider Department Center  10/08/2022  7:15 AM [facility] NURSE [facility]  10/08/2022  7:30 AM [facility] US1 [facility]  10/18/2022  8:15 AM [name], DO [facility] None  11/15/2022  4:10 PM Stinson, Rhona Raider, DO [facility] None    Lorriane Shire, MD

## 2022-10-01 DIAGNOSIS — O43102 Malformation of placenta, unspecified, second trimester: Secondary | ICD-10-CM | POA: Insufficient documentation

## 2022-10-08 ENCOUNTER — Encounter: Payer: Self-pay | Admitting: *Deleted

## 2022-10-08 ENCOUNTER — Ambulatory Visit: Payer: Managed Care, Other (non HMO) | Attending: Family Medicine

## 2022-10-08 ENCOUNTER — Other Ambulatory Visit: Payer: Self-pay | Admitting: *Deleted

## 2022-10-08 ENCOUNTER — Ambulatory Visit: Payer: Managed Care, Other (non HMO) | Admitting: *Deleted

## 2022-10-08 VITALS — BP 118/50 | HR 75

## 2022-10-08 DIAGNOSIS — O4692 Antepartum hemorrhage, unspecified, second trimester: Secondary | ICD-10-CM | POA: Insufficient documentation

## 2022-10-08 DIAGNOSIS — Z363 Encounter for antenatal screening for malformations: Secondary | ICD-10-CM | POA: Insufficient documentation

## 2022-10-08 DIAGNOSIS — Z3A18 18 weeks gestation of pregnancy: Secondary | ICD-10-CM | POA: Insufficient documentation

## 2022-10-08 DIAGNOSIS — O468X2 Other antepartum hemorrhage, second trimester: Secondary | ICD-10-CM

## 2022-10-08 DIAGNOSIS — Z8279 Family history of other congenital malformations, deformations and chromosomal abnormalities: Secondary | ICD-10-CM | POA: Diagnosis not present

## 2022-10-08 DIAGNOSIS — Z3480 Encounter for supervision of other normal pregnancy, unspecified trimester: Secondary | ICD-10-CM | POA: Insufficient documentation

## 2022-10-08 DIAGNOSIS — O99212 Obesity complicating pregnancy, second trimester: Secondary | ICD-10-CM | POA: Diagnosis not present

## 2022-10-08 DIAGNOSIS — O43102 Malformation of placenta, unspecified, second trimester: Secondary | ICD-10-CM | POA: Diagnosis not present

## 2022-10-18 ENCOUNTER — Ambulatory Visit (INDEPENDENT_AMBULATORY_CARE_PROVIDER_SITE_OTHER): Payer: Managed Care, Other (non HMO) | Admitting: Family Medicine

## 2022-10-18 VITALS — BP 124/58 | HR 85 | Wt 219.0 lb

## 2022-10-18 DIAGNOSIS — Z3480 Encounter for supervision of other normal pregnancy, unspecified trimester: Secondary | ICD-10-CM

## 2022-10-18 DIAGNOSIS — O43102 Malformation of placenta, unspecified, second trimester: Secondary | ICD-10-CM

## 2022-10-18 NOTE — Progress Notes (Signed)
   PRENATAL VISIT NOTE  Subjective:  Sheila Kirby is a 27 y.o. [phone] at [redacted]w[redacted]d being seen today for ongoing prenatal care.  She is currently monitored for the following issues for this low-risk pregnancy and has Family history of spina bifida; Cardiac murmur; Supervision of other normal pregnancy, antepartum; and Placental abnormality in second trimester on their problem list.  Patient reports  no vaginal bleeding for the last week .  Contractions: Not present. Vag. Bleeding: None.   . Denies leaking of fluid.   The following portions of the patient's history were reviewed and updated as appropriate: allergies, current medications, past family history, past medical history, past social history, past surgical history and problem list.   Objective:   Vitals:   10/18/22 0830  BP: (!) 124/58  Pulse: 85  Weight: 219 lb (99.3 kg)    Fetal Status: Fetal Heart Rate (bpm): 142 Fundal Height: 20 cm       General:  Alert, oriented and cooperative. Patient is in no acute distress.  Skin: Skin is warm and dry. No rash noted.   Cardiovascular: Normal heart rate noted  Respiratory: Normal respiratory effort, no problems with respiration noted  Abdomen: Soft, gravid, appropriate for gestational age.  Pain/Pressure: Absent     Pelvic: Cervical exam deferred        Extremities: Normal range of motion.  Edema: None  Mental Status: Normal mood and affect. Normal behavior. Normal judgment and thought content.   Assessment and Plan:  Pregnancy: G4P3003 at [redacted]w[redacted]d 1. Supervision of other normal pregnancy, antepartum FHT  2. Placental abnormality in second trimester Subchorionic hematoma Will continue to rest for the next week, then gradually reintroduce activities. Has f/u US in 2 weeks.    Preterm labor symptoms and general obstetric precautions including but not limited to vaginal bleeding, contractions, leaking of fluid and fetal movement were reviewed in detail with the patient. Please  refer to After Visit Summary for other counseling recommendations.   No follow-ups on file.  Future Appointments  Date Time Provider Department Center  11/05/2022  1:30 PM [facility] US4 [facility]  11/15/2022  4:10 PM [name], DO [facility] None  12/13/2022  8:15 AM Adrian Blackwater Rhona Raider, DO [facility] None    [name], DO

## 2022-11-05 ENCOUNTER — Ambulatory Visit: Payer: Managed Care, Other (non HMO) | Attending: Maternal & Fetal Medicine

## 2022-11-05 ENCOUNTER — Other Ambulatory Visit: Payer: Self-pay | Admitting: *Deleted

## 2022-11-05 ENCOUNTER — Other Ambulatory Visit: Payer: Self-pay

## 2022-11-05 DIAGNOSIS — O99212 Obesity complicating pregnancy, second trimester: Secondary | ICD-10-CM

## 2022-11-05 DIAGNOSIS — Z3A22 22 weeks gestation of pregnancy: Secondary | ICD-10-CM

## 2022-11-05 DIAGNOSIS — O4692 Antepartum hemorrhage, unspecified, second trimester: Secondary | ICD-10-CM

## 2022-11-05 DIAGNOSIS — O468X2 Other antepartum hemorrhage, second trimester: Secondary | ICD-10-CM | POA: Diagnosis present

## 2022-11-05 DIAGNOSIS — E669 Obesity, unspecified: Secondary | ICD-10-CM | POA: Diagnosis not present

## 2022-11-05 DIAGNOSIS — O0990 Supervision of high risk pregnancy, unspecified, unspecified trimester: Secondary | ICD-10-CM

## 2022-11-05 DIAGNOSIS — O43102 Malformation of placenta, unspecified, second trimester: Secondary | ICD-10-CM

## 2022-11-05 DIAGNOSIS — O352XX0 Maternal care for (suspected) hereditary disease in fetus, not applicable or unspecified: Secondary | ICD-10-CM

## 2022-11-05 DIAGNOSIS — O418X20 Other specified disorders of amniotic fluid and membranes, second trimester, not applicable or unspecified: Secondary | ICD-10-CM | POA: Diagnosis present

## 2022-11-15 ENCOUNTER — Ambulatory Visit: Payer: Managed Care, Other (non HMO) | Admitting: Family Medicine

## 2022-11-15 VITALS — BP 114/61 | HR 77 | Wt 222.0 lb

## 2022-11-15 DIAGNOSIS — O43102 Malformation of placenta, unspecified, second trimester: Secondary | ICD-10-CM

## 2022-11-15 DIAGNOSIS — Z8279 Family history of other congenital malformations, deformations and chromosomal abnormalities: Secondary | ICD-10-CM

## 2022-11-15 DIAGNOSIS — Z3480 Encounter for supervision of other normal pregnancy, unspecified trimester: Secondary | ICD-10-CM

## 2022-11-15 NOTE — Progress Notes (Signed)
   PRENATAL VISIT NOTE  Subjective:  Sheila Kirby is a 27 y.o. [phone] at [redacted]w[redacted]d being seen today for ongoing prenatal care.  She is currently monitored for the following issues for this low-risk pregnancy and has Family history of spina bifida; Cardiac murmur; Supervision of other normal pregnancy, antepartum; and Placental abnormality in second trimester on their problem list.  Patient reports no complaints.  Contractions: Not present. Vag. Bleeding: None.  Movement: Present. Denies leaking of fluid.   The following portions of the patient's history were reviewed and updated as appropriate: allergies, current medications, past family history, past medical history, past social history, past surgical history and problem list.   Objective:   Vitals:   11/15/22 1629  BP: 114/61  Pulse: 77  Weight: 222 lb (100.7 kg)    Fetal Status: Fetal Heart Rate (bpm): 153   Movement: Present     General:  Alert, oriented and cooperative. Patient is in no acute distress.  Skin: Skin is warm and dry. No rash noted.   Cardiovascular: Normal heart rate noted  Respiratory: Normal respiratory effort, no problems with respiration noted  Abdomen: Soft, gravid, appropriate for gestational age.  Pain/Pressure: Absent     Pelvic: Cervical exam deferred        Extremities: Normal range of motion.  Edema: None  Mental Status: Normal mood and affect. Normal behavior. Normal judgment and thought content.   Assessment and Plan:  Pregnancy: G4P3003 at [redacted]w[redacted]d 1. Supervision of other normal pregnancy, antepartum FHT normal  2. Placental abnormality in second trimester Question of marginal placenta. No vaginal bleeding.  Has rpt [place] next week  3. Family history of spina bifida   Preterm labor symptoms and general obstetric precautions including but not limited to vaginal bleeding, contractions, leaking of fluid and fetal movement were reviewed in detail with the patient. Please refer to After Visit  Summary for other counseling recommendations.   No follow-ups on file.  Future Appointments  Date Time Provider Department Center  11/19/2022  1:30 PM [facility] US2 [facility]  12/04/2022  2:30 PM [facility] US2 [facility]  12/13/2022  8:15 AM [name], DO [facility] None  12/27/2022  2:50 PM [name], DO [facility] None  01/10/2023  8:15 AM [name], DO [facility] None  01/23/2023  8:15 AM Adrian Blackwater Rhona Raider, DO [facility] None    [name], DO

## 2022-11-19 ENCOUNTER — Other Ambulatory Visit: Payer: Self-pay

## 2022-11-19 ENCOUNTER — Ambulatory Visit: Payer: Managed Care, Other (non HMO) | Attending: Obstetrics and Gynecology

## 2022-11-19 DIAGNOSIS — O43102 Malformation of placenta, unspecified, second trimester: Secondary | ICD-10-CM | POA: Insufficient documentation

## 2022-11-19 DIAGNOSIS — Z3A24 24 weeks gestation of pregnancy: Secondary | ICD-10-CM

## 2022-11-19 DIAGNOSIS — R011 Cardiac murmur, unspecified: Secondary | ICD-10-CM | POA: Diagnosis not present

## 2022-11-19 DIAGNOSIS — O99891 Other specified diseases and conditions complicating pregnancy: Secondary | ICD-10-CM

## 2022-11-19 DIAGNOSIS — O352XX0 Maternal care for (suspected) hereditary disease in fetus, not applicable or unspecified: Secondary | ICD-10-CM | POA: Diagnosis not present

## 2022-11-19 DIAGNOSIS — O4692 Antepartum hemorrhage, unspecified, second trimester: Secondary | ICD-10-CM | POA: Insufficient documentation

## 2022-11-19 DIAGNOSIS — O0990 Supervision of high risk pregnancy, unspecified, unspecified trimester: Secondary | ICD-10-CM | POA: Insufficient documentation

## 2022-11-19 DIAGNOSIS — O99212 Obesity complicating pregnancy, second trimester: Secondary | ICD-10-CM | POA: Diagnosis not present

## 2022-11-19 DIAGNOSIS — E669 Obesity, unspecified: Secondary | ICD-10-CM

## 2022-12-04 ENCOUNTER — Ambulatory Visit: Payer: Managed Care, Other (non HMO)

## 2022-12-05 ENCOUNTER — Encounter: Payer: Self-pay | Admitting: Family Medicine

## 2022-12-11 ENCOUNTER — Encounter: Payer: Self-pay | Admitting: Family Medicine

## 2022-12-13 ENCOUNTER — Other Ambulatory Visit: Payer: Self-pay | Admitting: Family Medicine

## 2022-12-13 ENCOUNTER — Ambulatory Visit: Payer: Managed Care, Other (non HMO) | Admitting: Family Medicine

## 2022-12-13 ENCOUNTER — Encounter: Payer: Managed Care, Other (non HMO) | Admitting: Family Medicine

## 2022-12-13 VITALS — BP 114/61 | HR 85 | Wt 224.0 lb

## 2022-12-13 DIAGNOSIS — Z3480 Encounter for supervision of other normal pregnancy, unspecified trimester: Secondary | ICD-10-CM

## 2022-12-13 DIAGNOSIS — Z1331 Encounter for screening for depression: Secondary | ICD-10-CM

## 2022-12-13 DIAGNOSIS — O43102 Malformation of placenta, unspecified, second trimester: Secondary | ICD-10-CM

## 2022-12-13 DIAGNOSIS — Z8279 Family history of other congenital malformations, deformations and chromosomal abnormalities: Secondary | ICD-10-CM

## 2022-12-13 DIAGNOSIS — Z3A28 28 weeks gestation of pregnancy: Secondary | ICD-10-CM

## 2022-12-13 DIAGNOSIS — O26813 Pregnancy related exhaustion and fatigue, third trimester: Secondary | ICD-10-CM

## 2022-12-13 DIAGNOSIS — O43103 Malformation of placenta, unspecified, third trimester: Secondary | ICD-10-CM

## 2022-12-13 NOTE — Progress Notes (Signed)
   PRENATAL VISIT NOTE  Subjective:  Sheila Kirby is a 27 y.o. [phone] at [redacted]w[redacted]d being seen today for ongoing prenatal care.  She is currently monitored for the following issues for this low-risk pregnancy and has Family history of spina bifida; Cardiac murmur; Supervision of other normal pregnancy, antepartum; and Placental abnormality in second trimester on their problem list.  Patient reports  having some dizziness, mild fatigue .  Contractions: Irritability. Vag. Bleeding: None.  Movement: Present. Denies leaking of fluid.   The following portions of the patient's history were reviewed and updated as appropriate: allergies, current medications, past family history, past medical history, past social history, past surgical history and problem list.   Objective:   Vitals:   12/13/22 0826  BP: 114/61  Pulse: 85  Weight: 224 lb (101.6 kg)    Fetal Status: Fetal Heart Rate (bpm): 146 Fundal Height: 28 cm Movement: Present     General:  Alert, oriented and cooperative. Patient is in no acute distress.  Skin: Skin is warm and dry. No rash noted.   Cardiovascular: Normal heart rate noted  Respiratory: Normal respiratory effort, no problems with respiration noted  Abdomen: Soft, gravid, appropriate for gestational age.  Pain/Pressure: Present     Pelvic: Cervical exam deferred        Extremities: Normal range of motion.  Edema: Trace  Mental Status: Normal mood and affect. Normal behavior. Normal judgment and thought content.   Assessment and Plan:  Pregnancy: G4P3003 at [redacted]w[redacted]d 1. [redacted] weeks gestation of pregnancy (Primary) - CBC - Glucose Tolerance, 2 Hours w/1 Hour - HIV Antibody (routine testing w rflx) - RPR  2. Supervision of other normal pregnancy, antepartum FHT and FH normal  3. Family history of spina bifida Normal AFP  4. Placental abnormality in second trimester Resolved placenta previa  5. Pregnancy related fatigue in third trimester Check CBC, CMP, TSH. -  Comp Met (CMET) - TSH  Preterm labor symptoms and general obstetric precautions including but not limited to vaginal bleeding, contractions, leaking of fluid and fetal movement were reviewed in detail with the patient. Please refer to After Visit Summary for other counseling recommendations.   No follow-ups on file.  Future Appointments  Date Time Provider Department Center  12/27/2022  2:50 PM [name], DO [facility] None  01/10/2023  8:15 AM [name], DO [facility] None  01/23/2023  8:15 AM [name], DO [facility] None    [name], DO

## 2022-12-14 LAB — COMPREHENSIVE METABOLIC PANEL
ALT: 22 [IU]/L (ref 0–32)
AST: 15 [IU]/L (ref 0–40)
Albumin: 3.8 g/dL — ABNORMAL LOW (ref 4.0–5.0)
Alkaline Phosphatase: 109 [IU]/L (ref 44–121)
BUN/Creatinine Ratio: 10 (ref 9–23)
BUN: 7 mg/dL (ref 6–20)
Bilirubin Total: 0.2 mg/dL (ref 0.0–1.2)
CO2: 22 mmol/L (ref 20–29)
Calcium: 9 mg/dL (ref 8.7–10.2)
Chloride: 104 mmol/L (ref 96–106)
Creatinine, Ser: 0.67 mg/dL (ref 0.57–1.00)
Globulin, Total: 2.3 g/dL (ref 1.5–4.5)
Glucose: 76 mg/dL (ref 70–99)
Potassium: 4.3 mmol/L (ref 3.5–5.2)
Sodium: 140 mmol/L (ref 134–144)
Total Protein: 6.1 g/dL (ref 6.0–8.5)
eGFR: 122 mL/min/{1.73_m2} (ref >59)

## 2022-12-14 LAB — GLUCOSE TOLERANCE, 2 HOURS W/ 1HR
Glucose, 1 hour: 101 mg/dL (ref 70–179)
Glucose, 2 hour: 81 mg/dL (ref 70–152)
Glucose, Fasting: 75 mg/dL (ref 70–91)

## 2022-12-14 LAB — CBC
Hematocrit: 36.1 % (ref 34.0–46.6)
Hemoglobin: 12 g/dL (ref 11.1–15.9)
MCH: 29.9 pg (ref 26.6–33.0)
MCHC: 33.2 g/dL (ref 31.5–35.7)
MCV: 90 fL (ref 79–97)
Platelets: 227 10*3/uL (ref 150–450)
RBC: 4.02 x10E6/uL (ref 3.77–5.28)
RDW: 11.5 % — ABNORMAL LOW (ref 11.7–15.4)
WBC: 7.7 10*3/uL (ref 3.4–10.8)

## 2022-12-14 LAB — HIV ANTIBODY (ROUTINE TESTING W REFLEX): HIV Screen 4th Generation wRfx: NONREACTIVE

## 2022-12-14 LAB — TSH: TSH: 1.6 u[IU]/mL (ref 0.450–4.500)

## 2022-12-14 LAB — RPR: RPR Ser Ql: NONREACTIVE

## 2022-12-27 ENCOUNTER — Ambulatory Visit: Payer: Managed Care, Other (non HMO) | Admitting: Family Medicine

## 2022-12-27 VITALS — BP 120/67 | HR 92 | Wt 224.0 lb

## 2022-12-27 DIAGNOSIS — Z8279 Family history of other congenital malformations, deformations and chromosomal abnormalities: Secondary | ICD-10-CM

## 2022-12-27 DIAGNOSIS — O368130 Decreased fetal movements, third trimester, not applicable or unspecified: Secondary | ICD-10-CM

## 2022-12-27 DIAGNOSIS — Z3A30 30 weeks gestation of pregnancy: Secondary | ICD-10-CM

## 2022-12-27 DIAGNOSIS — Z3480 Encounter for supervision of other normal pregnancy, unspecified trimester: Secondary | ICD-10-CM

## 2022-12-27 NOTE — Progress Notes (Signed)
   PRENATAL VISIT NOTE  Subjective:  Sheila Kirby is a 27 y.o. [phone] at [redacted]w[redacted]d being seen today for ongoing prenatal care.  She is currently monitored for the following issues for this low-risk pregnancy and has Family history of spina bifida; Cardiac murmur; and Supervision of other normal pregnancy, antepartum on their problem list.  Patient reports no complaints.  Contractions: Not present. Vag. Bleeding: None.  Movement: (!) Decreased. Denies leaking of fluid.   The following portions of the patient's history were reviewed and updated as appropriate: allergies, current medications, past family history, past medical history, past social history, past surgical history and problem list.   Objective:   Vitals:   12/27/22 1507  BP: 120/67  Pulse: 92  Weight: 224 lb (101.6 kg)    Fetal Status:     Movement: (!) Decreased     General:  Alert, oriented and cooperative. Patient is in no acute distress.  Skin: Skin is warm and dry. No rash noted.   Cardiovascular: Normal heart rate noted  Respiratory: Normal respiratory effort, no problems with respiration noted  Abdomen: Soft, gravid, appropriate for gestational age.  Pain/Pressure: Absent     Pelvic: Cervical exam deferred        Extremities: Normal range of motion.  Edema: None  Mental Status: Normal mood and affect. Normal behavior. Normal judgment and thought content.   Assessment and Plan:  Pregnancy: G4P3003 at [redacted]w[redacted]d 1. [redacted] weeks gestation of pregnancy (Primary)  2. Supervision of other normal pregnancy, antepartum FHT normal  3. Family history of spina bifida Normal AFP  4. Decreased fetal movements in third trimester, single or unspecified fetus NST reactive. Will arrange testing with MFM   Preterm labor symptoms and general obstetric precautions including but not limited to vaginal bleeding, contractions, leaking of fluid and fetal movement were reviewed in detail with the patient. Please refer to After Visit  Summary for other counseling recommendations.   No follow-ups on file.  Future Appointments  Date Time Provider Department Center  01/10/2023  8:15 AM [name], DO [facility] None  01/23/2023  8:15 AM Adrian Blackwater Rhona Raider, DO [facility] None    [name], DO

## 2022-12-30 ENCOUNTER — Ambulatory Visit (HOSPITAL_BASED_OUTPATIENT_CLINIC_OR_DEPARTMENT_OTHER)
Admission: RE | Admit: 2022-12-30 | Discharge: 2022-12-30 | Disposition: A | Discharge status: Home / Self Care | Payer: Managed Care, Other (non HMO) | Source: Ambulatory Visit | Attending: Internal Medicine | Admitting: Internal Medicine

## 2022-12-30 ENCOUNTER — Encounter (HOSPITAL_BASED_OUTPATIENT_CLINIC_OR_DEPARTMENT_OTHER): Payer: Self-pay

## 2022-12-30 VITALS — BP 102/67 | HR 88 | Temp 98.1°F | Resp 18

## 2022-12-30 DIAGNOSIS — J019 Acute sinusitis, unspecified: Secondary | ICD-10-CM

## 2022-12-30 MED ORDER — AMOXICILLIN-POT CLAVULANATE 875-125 MG PO TABS: 1.0000 | ORAL_TABLET | Freq: Two times a day (BID) | ORAL | 0 refills | Status: DC

## 2022-12-30 NOTE — ED Triage Notes (Signed)
Pt c/o sinus congestion, cough, sinus pressure, headache x 4 days,  pt is currently [redacted] weeks pregnant.

## 2022-12-30 NOTE — ED Provider Notes (Signed)
[facility]    CSN: [id number] Arrival date & time: 12/30/22  2595      History   Chief Complaint Chief Complaint  Patient presents with   Cough    Shortness of breath, heavy chest  sinus pressure - Entered by patient    HPI Sheila Kirby is a 27 y.o. female.    Cough Nasal congestion, postnasal drip, rhinorrhea for 4 days.  Admits nonproductive cough, states last night felt short of breath during coughing fit.  Admits sinus pressure, she is concerned she has a sinus infection.  Denies current chest pain, shortness of breath or wheezing.  She is 30 weeks 4 days pregnant this is her fourth pregnancy.  She has had vaginal discharge blood-tinged at times states this is normal for this pregnancy as she has a subchorionic hemorrhage which is being followed by her OB/GYN. Denies fever, chills, sweats, vomiting, urinary symptoms  Past Medical History:  Diagnosis Date   Abnormal uterine bleeding, postpartum 01/30/2019   Cardiac murmur 12/17/2018   Family history of spina bifida 11/15/2014   Sore throat 12/24/2018   Symptoms of upper respiratory infection (URI) 12/24/2018    Patient Active Problem List   Diagnosis Date Noted   Supervision of other normal pregnancy, antepartum 08/23/2022   Cardiac murmur 12/17/2018   Family history of spina bifida 11/15/2014    Past Surgical History:  Procedure Laterality Date   DILATION AND EVACUATION N/A 01/30/2019   Procedure: DILATATION AND EVACUATION;  Surgeon: Adam Phenix, MD;  Location: [facility];  Service: Gynecology;  Laterality: N/A;   WISDOM TOOTH EXTRACTION      OB History     Gravida  4   Para  3   Term  3   Preterm      AB      Living  3      SAB      IAB      Ectopic      Multiple  0   Live Births  3            Home Medications    Prior to Admission medications   Medication Sig Start Date End Date Taking? Authorizing Provider  Prenatal Vit-Fe Fumarate-FA  (PRENATAL MULTIVITAMIN) TABS tablet Take 1 tablet by mouth daily at 12 noon.   Yes [provider]  doxylamine, Sleep, (UNISOM) 25 MG tablet Take 25 mg by mouth at bedtime as needed.    [provider]    Family History Family History  Problem Relation Age of Onset   Hypertension Mother    Thyroid disease Mother    Diabetes Father    Hypertension Father    Cancer Sister        skin cancer   Cancer Maternal Grandfather        skin   Heart attack Paternal Grandmother    Cancer Paternal Grandfather        renal   Heart attack Maternal Aunt    Heart attack Maternal Aunt    Heart attack Maternal Aunt    Spina bifida Paternal Uncle    Stroke Neg Hx    Miscarriages / Stillbirths Neg Hx    Mental retardation Neg Hx     Social History Social History   Tobacco Use   Smoking status: Never   Smokeless tobacco: Never  Vaping Use   Vaping status: Never Used  Substance Use Topics   Alcohol use: No   Drug  use: No     Allergies   Patient has no known allergies.   Review of Systems Review of Systems  Respiratory:  Positive for cough.      Physical Exam Triage Vital Signs ED Triage Vitals  Encounter Vitals Group     BP 12/30/22 0921 102/67     Systolic BP Percentile --      Diastolic BP Percentile --      Pulse Rate 12/30/22 0921 88     Resp 12/30/22 0921 18     Temp 12/30/22 0921 98.1 F (36.7 C)     Temp Source 12/30/22 0921 Oral     SpO2 12/30/22 0921 97 %     Weight --      Height --      Head Circumference --      Peak Flow --      Pain Score 12/30/22 0920 0     Pain Loc --      Pain Education --      Exclude from Growth Chart --    No data found.  Updated Vital Signs BP 102/67 (BP Location: Right Arm)   Pulse 88   Temp 98.1 F (36.7 C) (Oral)   Resp 18   LMP 05/21/2022   SpO2 97%   Visual Acuity Right Eye Distance:   Left Eye Distance:   Bilateral Distance:    Right Eye Near:   Left Eye Near:    Bilateral Near:      Physical Exam Vitals and nursing note reviewed.  Constitutional:      Appearance: She is not ill-appearing.  HENT:     Head: Normocephalic and atraumatic.     Right Ear: Tympanic membrane and ear canal normal.     Left Ear: Tympanic membrane normal.     Nose: No congestion or rhinorrhea.     Mouth/Throat:     Mouth: Mucous membranes are moist.     Pharynx: No oropharyngeal exudate.  Eyes:     Conjunctiva/sclera: Conjunctivae normal.  Cardiovascular:     Rate and Rhythm: Normal rate and regular rhythm.     Heart sounds: Normal heart sounds.  Pulmonary:     Effort: Pulmonary effort is normal. No respiratory distress.     Breath sounds: Normal breath sounds. No wheezing, rhonchi or rales.  Abdominal:     Comments: Gravda abdomen  Musculoskeletal:     Cervical back: Neck supple.  Lymphadenopathy:     Cervical: No cervical adenopathy.  Neurological:     Mental Status: She is alert and oriented to person, place, and time.  Psychiatric:        Mood and Affect: Mood normal.      UC Treatments / Results  Labs (all labs ordered are listed, but only abnormal results are displayed) Labs Reviewed - No data to display  EKG   Radiology No results found.  Procedures Procedures (including critical care time)  Medications Ordered in UC Medications - No data to display  Initial Impression / Assessment and Plan / UC Course  I have reviewed the triage vital signs and the nursing notes.  Pertinent labs & imaging results that were available during my care of the patient were reviewed by me and considered in my medical decision making (see chart for details).     27 year old female [redacted] weeks pregnant with her fourth pregnancy, having URI symptoms for several days she is concerned she may have a sinus infection.  She is well-appearing, afebrile,  lungs are clear to auscultation, no evidence of ear or throat infection.  Gust with patient this is likely a viral infection, recommend  she stick with over-the-counter medicines which are considered safe during pregnancy for her symptoms.  If her symptoms worsen she develops fever can fill the antibiotic prescription as written.  She was counseled to follow-up with her OB/GYN for any pregnancy related complaints or concerns Final Clinical Impressions(s) / UC Diagnoses   Final diagnoses:  None   Discharge Instructions   None    ED Prescriptions   None    PDMP not reviewed this encounter.   Meliton Rattan, [place] 12/30/22 [phone]

## 2022-12-30 NOTE — Discharge Instructions (Signed)
Follow-up with your OB/GYN immediately for any concerns regarding her pregnancy

## 2023-01-02 NOTE — L&D Delivery Note (Signed)
 [facility]N [facility] Delivery Note  Vicktoria Muckey is a 28 y.o. [id number] s/p NSVD at [redacted]w[redacted]d. She was admitted for resolved FGR and decreased fetal movement.   ROM: 4h 36m with clear fluid GBS Status:  Negative/-- (02/06 0909) Maximum Maternal Temperature: afebrile  Labor Progress: Initial SVE: 3/80/-3. She then progressed to complete.   Delivery Date/Time: 02/20/2023 @ 1633 Delivery: Called to room and patient was complete and pushing on edge of bed. I did a hand over hand delivery with patient's husband per patient request. Head delivered ROA. No nuchal cord present. Shoulder and body delivered in usual fashion. Infant with spontaneous cry, placed on mother's abdomen, dried and stimulated. Cord clamped x 2 after 1-minute delay, and cut by patient's friend. Cord blood drawn. Placenta delivered spontaneously with gentle cord traction. Fundus firm with massage and Pitocin. Labia, perineum, vagina, and cervix inspected inspected with no lacerations.  Baby Weight: 7# 0.9oz  Placenta: Sent to L&D Complications: None Lacerations: none EBL: 225 mL Analgesia: nitrous oxide   Infant:  APGAR (1 MIN):  8 APGAR (5 MINS):  9 APGAR (10 MINS):     [name], DO [facility] 02/20/2023, 4:49 PM

## 2023-01-10 ENCOUNTER — Ambulatory Visit (INDEPENDENT_AMBULATORY_CARE_PROVIDER_SITE_OTHER): Payer: Managed Care, Other (non HMO) | Admitting: Family Medicine

## 2023-01-10 VITALS — BP 114/59 | HR 86 | Wt 223.0 lb

## 2023-01-10 DIAGNOSIS — Z3480 Encounter for supervision of other normal pregnancy, unspecified trimester: Secondary | ICD-10-CM

## 2023-01-10 DIAGNOSIS — Z3A32 32 weeks gestation of pregnancy: Secondary | ICD-10-CM

## 2023-01-10 DIAGNOSIS — O368130 Decreased fetal movements, third trimester, not applicable or unspecified: Secondary | ICD-10-CM

## 2023-01-10 DIAGNOSIS — Z8279 Family history of other congenital malformations, deformations and chromosomal abnormalities: Secondary | ICD-10-CM

## 2023-01-10 NOTE — Progress Notes (Signed)
   PRENATAL VISIT NOTE  Subjective:  Sheila Kirby is a 28 y.o. [phone] at [redacted]w[redacted]d being seen today for ongoing prenatal care.  She is currently monitored for the following issues for this low-risk pregnancy and has Family history of spina bifida; Cardiac murmur; and Supervision of other normal pregnancy, antepartum on their problem list.  Patient reports no complaints.  Contractions: Irritability. Vag. Bleeding: None.  Movement: Present. Denies leaking of fluid.   The following portions of the patient's history were reviewed and updated as appropriate: allergies, current medications, past family history, past medical history, past social history, past surgical history and problem list.   Objective:   Vitals:   01/10/23 0836  BP: (!) 114/59  Pulse: 86  Weight: 223 lb (101.2 kg)    Fetal Status: Fetal Heart Rate (bpm): 130   Movement: Present     General:  Alert, oriented and cooperative. Patient is in no acute distress.  Skin: Skin is warm and dry. No rash noted.   Cardiovascular: Normal heart rate noted  Respiratory: Normal respiratory effort, no problems with respiration noted  Abdomen: Soft, gravid, appropriate for gestational age.  Pain/Pressure: Present     Pelvic: Cervical exam deferred        Extremities: Normal range of motion.  Edema: None  Mental Status: Normal mood and affect. Normal behavior. Normal judgment and thought content.   Assessment and Plan:  Pregnancy: G4P3003 at [redacted]w[redacted]d 1. [redacted] weeks gestation of pregnancy (Primary)  2. Supervision of other normal pregnancy, antepartum FHT normal Still doesn't feel the baby move consistently.    NST today: reactive  3. Family history of spina bifida  4. Decreased fetal movements in third trimester, single or unspecified fetus Chronic decreased fetal movement. NSt reactive Will schedule BPP and growth US . - US  MFM FETAL BPP WO NON STRESS; Future - US  MFM OB FOLLOW UP; Future   Preterm labor symptoms and general  obstetric precautions including but not limited to vaginal bleeding, contractions, leaking of fluid and fetal movement were reviewed in detail with the patient. Please refer to After Visit Summary for other counseling recommendations.   No follow-ups on file.  Future Appointments  Date Time Provider Department Center  01/23/2023  8:15 AM Len Kluver J, DO [facility] None  02/07/2023  8:15 AM Yvetta Drotar J, DO [facility] None  02/20/2023  8:15 AM Megan Hayduk J, DO [facility] None    Ahmani Prehn J Zelda Reames, DO

## 2023-01-18 ENCOUNTER — Ambulatory Visit: Payer: Managed Care, Other (non HMO) | Attending: Maternal & Fetal Medicine | Admitting: Maternal & Fetal Medicine

## 2023-01-18 ENCOUNTER — Other Ambulatory Visit: Payer: Self-pay

## 2023-01-18 ENCOUNTER — Other Ambulatory Visit: Payer: Self-pay | Admitting: Family Medicine

## 2023-01-18 ENCOUNTER — Ambulatory Visit: Payer: Managed Care, Other (non HMO) | Attending: Family Medicine

## 2023-01-18 ENCOUNTER — Ambulatory Visit: Payer: Managed Care, Other (non HMO) | Admitting: *Deleted

## 2023-01-18 ENCOUNTER — Other Ambulatory Visit: Payer: Self-pay | Admitting: *Deleted

## 2023-01-18 VITALS — BP 114/62 | HR 89

## 2023-01-18 DIAGNOSIS — R011 Cardiac murmur, unspecified: Secondary | ICD-10-CM | POA: Insufficient documentation

## 2023-01-18 DIAGNOSIS — O368130 Decreased fetal movements, third trimester, not applicable or unspecified: Secondary | ICD-10-CM | POA: Insufficient documentation

## 2023-01-18 DIAGNOSIS — O99413 Diseases of the circulatory system complicating pregnancy, third trimester: Secondary | ICD-10-CM | POA: Diagnosis not present

## 2023-01-18 DIAGNOSIS — O365930 Maternal care for other known or suspected poor fetal growth, third trimester, not applicable or unspecified: Secondary | ICD-10-CM | POA: Diagnosis not present

## 2023-01-18 DIAGNOSIS — O4403 Placenta previa specified as without hemorrhage, third trimester: Secondary | ICD-10-CM

## 2023-01-18 DIAGNOSIS — Z8759 Personal history of other complications of pregnancy, childbirth and the puerperium: Secondary | ICD-10-CM | POA: Insufficient documentation

## 2023-01-18 DIAGNOSIS — Z8279 Family history of other congenital malformations, deformations and chromosomal abnormalities: Secondary | ICD-10-CM | POA: Diagnosis not present

## 2023-01-18 DIAGNOSIS — O99893 Other specified diseases and conditions complicating puerperium: Secondary | ICD-10-CM | POA: Diagnosis not present

## 2023-01-18 DIAGNOSIS — O99213 Obesity complicating pregnancy, third trimester: Secondary | ICD-10-CM | POA: Insufficient documentation

## 2023-01-18 DIAGNOSIS — Z3A32 32 weeks gestation of pregnancy: Secondary | ICD-10-CM | POA: Insufficient documentation

## 2023-01-18 DIAGNOSIS — Z3480 Encounter for supervision of other normal pregnancy, unspecified trimester: Secondary | ICD-10-CM

## 2023-01-18 DIAGNOSIS — Z3A33 33 weeks gestation of pregnancy: Secondary | ICD-10-CM

## 2023-01-18 DIAGNOSIS — O352XX0 Maternal care for (suspected) hereditary disease in fetus, not applicable or unspecified: Secondary | ICD-10-CM

## 2023-01-18 DIAGNOSIS — O368190 Decreased fetal movements, unspecified trimester, not applicable or unspecified: Secondary | ICD-10-CM

## 2023-01-18 DIAGNOSIS — O365990 Maternal care for other known or suspected poor fetal growth, unspecified trimester, not applicable or unspecified: Secondary | ICD-10-CM

## 2023-01-18 NOTE — Progress Notes (Signed)
Patient information  Patient Name: Sheila Kirby  Patient MRN:   [id number]  Referring practice: [facility] Referring Provider: [place] [facility] [place] [facility]  MFM CONSULT  Sheila Kirby is a 28 y.o. G4P3003 at [redacted]w[redacted]d here for ultrasound and consultation. Patient Active Problem List   Diagnosis Date Noted   Supervision of other normal pregnancy, antepartum 08/23/2022   Cardiac murmur 12/17/2018   Family history of spina bifida 11/15/2014   Sheila Kirby is doing well today with no acute concerns. She denies contractions, bleeding, or loss of fluid and reports good fetal movement.   RE Counseling  I discussed the finding of fetal growth restriction (FGR) with the patient today. The ultrasound shows an overall growth at the 9th percentile and the abdominal circumference at the 16th percentile. The umbilical artery Dopplers are normal. The BPP was 8/8.  I counseled her about the clinical significance of the Doppler findings and antenatal testing. I discussed the various causes of growth restriction including constitutionally small fetus, placental insufficiency, genetic problems and chronic maternal disease. Currently there is no evidence of sonographic stigmata suggesting infection or aneuploidy.  I discussed the most likely cause of her fetal growth restriction is either a constitutionally small fetus or placental insufficiency. I also discussed the importance of antenatal fetal surveillance including antenatal testing and umbilical artery Doppler assessment to reduce the risk of stillbirth.  I discussed the management going forward in the pregnancy with potential alteration in the timing of delivery. Currently she feels well and denies headache, vision changes, right upper quadrant pain, contractions, vaginal bleeding or loss of fluid.  She reports good fetal movement.   There are limitations of prenatal ultrasound such as the inability to detect certain abnormalities due to poor  visualization. Various factors such as fetal position, gestational age and maternal body habitus may increase the difficulty in visualizing the fetal anatomy.    Sonographic findings Single intrauterine pregnancy. Fetal cardiac activity:  Observed and appears normal. Presentation: Cephalic. Interval fetal anatomy appears normal. Fetal biometry shows the estimated fetal weight at the 9 percentile and the abdominal circumference at the 16 th percentile.  Amniotic fluid: Within normal limits.  MVP: 5.29 cm. Placenta: Posterior. Umbilical artery dopplers findings: -S/D:2.87 which are normal at this gestational age.  -Absent end-diastolic flow: No.  -Reversed end-diastolic flow:  No. BPP 8/8.   Recommendations - Continue weekly UA Dopplers and BPPs.  If umbilical artery Dopplers become elevated with periods of absent end-diastolic flow she should have a nonstress test added to allow for twice-weekly antenatal testing. - Continue serial growth ultrasounds every 3 weeks until delivery. - Delivery timing will depend on future antenatal testing and UA dopplers, but likely around 38 weeks  Review of Systems: A review of systems was performed and was negative except per HPI   Vitals and Physical Exam    01/18/2023    7:22 AM 01/10/2023    8:36 AM 12/30/2022    9:21 AM  Vitals with BMI  Weight  223 lbs   Systolic 114 114 409  Diastolic 62 59 67  Pulse 89 86 88    Sitting comfortably on the sonogram table Nonlabored breathing Normal rate and rhythm Abdomen is nontender  Past pregnancies OB History  Gravida Para Term Preterm AB Living  4 3 3   3   SAB IAB Ectopic Multiple Live Births     0 3    # Outcome Date GA Lbr Len/2nd Weight Sex Type Anes  PTL Lv  4 Current           3 Term 01/13/19 [redacted]w[redacted]d 02:55 / 00:01 8 lb 6.6 oz (3.815 kg) M Vag-Spont None  LIV  2 Term 11/06/17 [redacted]w[redacted]d 03:55 / 01:44 7 lb 10.9 oz (3.485 kg) M Vag-Spont EPI  LIV  1 Term 06/23/15 [redacted]w[redacted]d 03:15 / 01:38 7 lb 3.5 oz  (3.274 kg) F Vag-Spont EPI  LIV    I spent 20 minutes reviewing the patients chart, including labs and images as well as counseling the patient about her medical conditions. Greater than 50% of the time was spent in direct face-to-face patient counseling.  Braxton Feathers  MFM, [place]   01/18/2023  1:18 PM

## 2023-01-23 ENCOUNTER — Ambulatory Visit: Payer: Managed Care, Other (non HMO) | Admitting: Family Medicine

## 2023-01-23 VITALS — BP 120/65 | HR 88 | Wt 224.0 lb

## 2023-01-23 DIAGNOSIS — Z8279 Family history of other congenital malformations, deformations and chromosomal abnormalities: Secondary | ICD-10-CM

## 2023-01-23 DIAGNOSIS — O365930 Maternal care for other known or suspected poor fetal growth, third trimester, not applicable or unspecified: Secondary | ICD-10-CM

## 2023-01-23 DIAGNOSIS — Z3480 Encounter for supervision of other normal pregnancy, unspecified trimester: Secondary | ICD-10-CM

## 2023-01-23 DIAGNOSIS — Z3A34 34 weeks gestation of pregnancy: Secondary | ICD-10-CM

## 2023-01-23 NOTE — Progress Notes (Signed)
   PRENATAL VISIT NOTE  Subjective:  Sheila Kirby is a 28 y.o. [phone] at [redacted]w[redacted]d being seen today for ongoing prenatal care.  She is currently monitored for the following issues for this high-risk pregnancy and has Family history of spina bifida; Cardiac murmur; Supervision of other normal pregnancy, antepartum; and Poor fetal growth affecting management of mother in third trimester on their problem list.  Patient reports  still has decreased movement. Getting BPPs for FGR .  Contractions: Not present. Vag. Bleeding: None.  Movement: Present. Denies leaking of fluid.   The following portions of the patient's history were reviewed and updated as appropriate: allergies, current medications, past family history, past medical history, past social history, past surgical history and problem list.   Objective:   Vitals:   01/23/23 0818  BP: 120/65  Pulse: 88  Weight: 224 lb (101.6 kg)    Fetal Status: Fetal Heart Rate (bpm): 154   Movement: Present     General:  Alert, oriented and cooperative. Patient is in no acute distress.  Skin: Skin is warm and dry. No rash noted.   Cardiovascular: Normal heart rate noted  Respiratory: Normal respiratory effort, no problems with respiration noted  Abdomen: Soft, gravid, appropriate for gestational age.  Pain/Pressure: Absent     Pelvic: Cervical exam performed in the presence of a chaperone        Extremities: Normal range of motion.  Edema: None  Mental Status: Normal mood and affect. Normal behavior. Normal judgment and thought content.   Assessment and Plan:  Pregnancy: G4P3003 at [redacted]w[redacted]d 1. Supervision of other normal pregnancy, antepartum (Primary)  2. Family history of spina bifida AFP neg  3. Poor fetal growth affecting management of mother in third trimester, single or unspecified fetus BPP and dopplers weekly. Patient has chronic decreased movement, which makes her nervous. Will discuss with MFM about potential induction closer to 37  weeks.  Preterm labor symptoms and general obstetric precautions including but not limited to vaginal bleeding, contractions, leaking of fluid and fetal movement were reviewed in detail with the patient. Please refer to After Visit Summary for other counseling recommendations.   No follow-ups on file.  Future Appointments  Date Time Provider Department Center  01/25/2023  3:30 PM [facility] US3 [facility]  02/01/2023  7:30 AM [facility] US4 [facility]  02/07/2023  8:15 AM [name], DO [facility] None  02/08/2023  7:30 AM [facility] US3 [facility]  02/20/2023  8:15 AM Adrian Blackwater, Rhona Raider, DO [facility] None    [name], DO

## 2023-01-24 ENCOUNTER — Encounter: Payer: Self-pay | Admitting: Family Medicine

## 2023-01-25 ENCOUNTER — Other Ambulatory Visit: Payer: Self-pay

## 2023-01-25 ENCOUNTER — Ambulatory Visit: Payer: Managed Care, Other (non HMO) | Attending: Maternal & Fetal Medicine

## 2023-01-25 VITALS — BP 113/48

## 2023-01-25 DIAGNOSIS — O99213 Obesity complicating pregnancy, third trimester: Secondary | ICD-10-CM | POA: Diagnosis not present

## 2023-01-25 DIAGNOSIS — O4413 Placenta previa with hemorrhage, third trimester: Secondary | ICD-10-CM | POA: Insufficient documentation

## 2023-01-25 DIAGNOSIS — O365930 Maternal care for other known or suspected poor fetal growth, third trimester, not applicable or unspecified: Secondary | ICD-10-CM | POA: Diagnosis not present

## 2023-01-25 DIAGNOSIS — O99891 Other specified diseases and conditions complicating pregnancy: Secondary | ICD-10-CM

## 2023-01-25 DIAGNOSIS — Z3480 Encounter for supervision of other normal pregnancy, unspecified trimester: Secondary | ICD-10-CM | POA: Insufficient documentation

## 2023-01-25 DIAGNOSIS — O365990 Maternal care for other known or suspected poor fetal growth, unspecified trimester, not applicable or unspecified: Secondary | ICD-10-CM | POA: Insufficient documentation

## 2023-01-25 DIAGNOSIS — R011 Cardiac murmur, unspecified: Secondary | ICD-10-CM | POA: Diagnosis not present

## 2023-01-25 DIAGNOSIS — Z3A34 34 weeks gestation of pregnancy: Secondary | ICD-10-CM | POA: Insufficient documentation

## 2023-01-25 DIAGNOSIS — Z8279 Family history of other congenital malformations, deformations and chromosomal abnormalities: Secondary | ICD-10-CM | POA: Diagnosis not present

## 2023-01-25 DIAGNOSIS — E669 Obesity, unspecified: Secondary | ICD-10-CM

## 2023-01-25 DIAGNOSIS — O352XX0 Maternal care for (suspected) hereditary disease in fetus, not applicable or unspecified: Secondary | ICD-10-CM | POA: Diagnosis not present

## 2023-01-25 DIAGNOSIS — O4403 Placenta previa specified as without hemorrhage, third trimester: Secondary | ICD-10-CM

## 2023-01-27 ENCOUNTER — Encounter (HOSPITAL_COMMUNITY): Payer: Self-pay | Admitting: Obstetrics and Gynecology

## 2023-01-27 ENCOUNTER — Other Ambulatory Visit: Payer: Self-pay

## 2023-01-27 ENCOUNTER — Inpatient Hospital Stay (HOSPITAL_COMMUNITY)
Admission: AD | Admit: 2023-01-27 | Discharge: 2023-01-27 | Disposition: A | Discharge status: Home / Self Care | Payer: Managed Care, Other (non HMO) | Attending: Obstetrics and Gynecology | Admitting: Obstetrics and Gynecology

## 2023-01-27 DIAGNOSIS — Z3A34 34 weeks gestation of pregnancy: Secondary | ICD-10-CM | POA: Insufficient documentation

## 2023-01-27 DIAGNOSIS — Z0371 Encounter for suspected problem with amniotic cavity and membrane ruled out: Secondary | ICD-10-CM | POA: Diagnosis present

## 2023-01-27 LAB — RUPTURE OF MEMBRANE (ROM)PLUS: Rom Plus: NEGATIVE

## 2023-01-27 LAB — POCT FERN TEST: POCT Fern Test: NEGATIVE

## 2023-01-27 NOTE — MAU Note (Signed)
Sheila Kirby is a 28 y.o. at [redacted]w[redacted]d here in MAU reporting: she has been leaking clear fluid since 0900 this morning.  Denies VB.  Endorses +FM.  Last intercourse was this morning.  LMP: NA Onset of complaint: today Pain score: 0 Vitals:   01/27/23 1243  BP: 118/68  Pulse: (!) 102  Resp: 19  Temp: 97.7 F (36.5 C)  SpO2: 99%     FHT: 126 bpm  Lab orders placed from triage: None

## 2023-01-27 NOTE — MAU Provider Note (Signed)
S: Ms. Sheila Kirby is a 28 y.o. [phone] at [redacted]w[redacted]d  who presents to MAU today complaining of leaking of fluid since this morning following intercourse at 0800. She denies vaginal bleeding. She denies contractions. She reports normal fetal movement.    O: BP (!) 120/59 (BP Location: Right Arm)   Pulse 94   Temp 98 F (36.7 C) (Oral)   Resp 16   Ht 5\' 9"  (1.753 m)   Wt 101.9 kg   LMP 05/21/2022   SpO2 100%   BMI 33.17 kg/m  GENERAL: Well-developed, well-nourished female in no acute distress.  HEAD: Normocephalic, atraumatic.  CHEST: Normal effort of breathing, regular heart rate ABDOMEN: Soft, nontender, gravid PELVIC: Normal external female genitalia. Vagina is pink and rugated. Cervix with normal contour, no lesions. Normal discharge.  Negative pooling.   Cervical exam:    Fetal Monitoring: Baseline: 125 bpm Variability: Moderate  Accelerations: 15x15 Decelerations: None Contractions: None  Results for orders placed or performed during the hospital encounter of 01/27/23 (from the past 24 hours)  Fern Test     Status: None   Collection Time: 01/27/23  1:17 PM  Result Value Ref Range   POCT Fern Test Negative = intact amniotic membranes   Rupture of Membrane (ROM) Plus     Status: None   Collection Time: 01/27/23  1:32 PM  Result Value Ref Range   Rom Plus NEGATIVE      A: [facility] at [redacted]w[redacted]d  Membranes intact  P:  Dc home Return if symptoms worsen  Duane Lope, NP 01/27/2023 3:47 PM

## 2023-01-31 ENCOUNTER — Encounter: Payer: Self-pay | Admitting: Family Medicine

## 2023-02-01 ENCOUNTER — Ambulatory Visit: Payer: Managed Care, Other (non HMO) | Attending: Maternal & Fetal Medicine

## 2023-02-01 DIAGNOSIS — O365930 Maternal care for other known or suspected poor fetal growth, third trimester, not applicable or unspecified: Secondary | ICD-10-CM

## 2023-02-01 DIAGNOSIS — O99413 Diseases of the circulatory system complicating pregnancy, third trimester: Secondary | ICD-10-CM

## 2023-02-01 DIAGNOSIS — R011 Cardiac murmur, unspecified: Secondary | ICD-10-CM

## 2023-02-01 DIAGNOSIS — O365990 Maternal care for other known or suspected poor fetal growth, unspecified trimester, not applicable or unspecified: Secondary | ICD-10-CM | POA: Insufficient documentation

## 2023-02-01 DIAGNOSIS — Z3A35 35 weeks gestation of pregnancy: Secondary | ICD-10-CM

## 2023-02-01 DIAGNOSIS — O352XX0 Maternal care for (suspected) hereditary disease in fetus, not applicable or unspecified: Secondary | ICD-10-CM

## 2023-02-01 DIAGNOSIS — O4403 Placenta previa specified as without hemorrhage, third trimester: Secondary | ICD-10-CM

## 2023-02-04 ENCOUNTER — Ambulatory Visit (HOSPITAL_BASED_OUTPATIENT_CLINIC_OR_DEPARTMENT_OTHER)
Admission: RE | Admit: 2023-02-04 | Discharge: 2023-02-04 | Disposition: A | Discharge status: Home / Self Care | Payer: Managed Care, Other (non HMO) | Source: Ambulatory Visit | Attending: Family Medicine | Admitting: Family Medicine

## 2023-02-04 ENCOUNTER — Encounter (HOSPITAL_BASED_OUTPATIENT_CLINIC_OR_DEPARTMENT_OTHER): Payer: Self-pay

## 2023-02-04 VITALS — BP 111/77 | HR 96 | Temp 97.7°F | Resp 20

## 2023-02-04 DIAGNOSIS — J320 Chronic maxillary sinusitis: Secondary | ICD-10-CM | POA: Diagnosis not present

## 2023-02-04 DIAGNOSIS — Z3A35 35 weeks gestation of pregnancy: Secondary | ICD-10-CM

## 2023-02-04 DIAGNOSIS — R051 Acute cough: Secondary | ICD-10-CM

## 2023-02-04 LAB — POCT URINALYSIS DIP (MANUAL ENTRY)
Bilirubin, UA: NEGATIVE
Blood, UA: NEGATIVE
Glucose, UA: NEGATIVE mg/dL
Ketones, POC UA: NEGATIVE mg/dL
Leukocytes, UA: NEGATIVE
Nitrite, UA: NEGATIVE
Protein Ur, POC: NEGATIVE mg/dL
Spec Grav, UA: 1.025
Urobilinogen, UA: 1 U/dL
pH, UA: 6.5

## 2023-02-04 MED ORDER — CEFDINIR 300 MG PO CAPS: 300.0000 mg | ORAL_CAPSULE | Freq: Two times a day (BID) | ORAL | 0 refills | Status: AC

## 2023-02-04 NOTE — ED Provider Notes (Signed)
[facility]    CSN: [id number] Arrival date & time: 02/04/23  1430      History   Chief Complaint Chief Complaint  Patient presents with   Nasal Congestion    Cold symptoms that have lasted for a while now. - Entered by patient    HPI Sheila Kirby is a 28 y.o. female.   Patient was seen on 12/30/2022 here at this urgent care with symptoms of sinusitis.  She was offered an antibiotic but decided not to take it and then could not find the prescription.  She has had head congestion, sinus pressure and pain, green nasal discharge for 6 or 8 weeks.  She is 35 weeks and 5 days pregnant and is due to deliver in 2 weeks for a planned early delivery.  She just feels like she is getting worse not better and needs to get on some antibiotics and get rid of the sinusitis.  She is having gross fetal movement all day long.     History reviewed. No pertinent past medical history.  There are no active problems to display for this patient.   History reviewed. No pertinent surgical history.  OB History     Gravida  1   Para      Term      Preterm      AB      Living         SAB      IAB      Ectopic      Multiple      Live Births               Home Medications    Prior to Admission medications   Medication Sig Start Date End Date Taking? Authorizing Provider  cefdinir (OMNICEF) 300 MG capsule Take 1 capsule (300 mg total) by mouth 2 (two) times daily after a meal for 7 days. 02/04/23 02/11/23 Yes Prescilla Sours, FNP    Family History History reviewed. No pertinent family history.  Social History Social History   Tobacco Use   Smoking status: Never   Smokeless tobacco: Never  Substance Use Topics   Alcohol use: Never   Drug use: Never     Allergies   Patient has no known allergies.   Review of Systems Review of Systems  Constitutional:  Positive for fatigue. Negative for chills and fever.  HENT:  Positive for congestion, postnasal drip,  rhinorrhea, sinus pressure and sinus pain. Negative for ear pain and sore throat.   Eyes:  Negative for pain and visual disturbance.  Respiratory:  Positive for cough. Negative for shortness of breath.   Cardiovascular:  Negative for chest pain and palpitations.  Gastrointestinal:  Negative for abdominal pain, constipation, diarrhea, nausea and vomiting.  Genitourinary:  Negative for dysuria and hematuria.  Musculoskeletal:  Negative for arthralgias and back pain.  Skin:  Negative for color change and rash.  Neurological:  Negative for seizures and syncope.  All other systems reviewed and are negative.    Physical Exam Triage Vital Signs ED Triage Vitals  Encounter Vitals Group     BP 02/04/23 1448 111/77     Systolic BP Percentile --      Diastolic BP Percentile --      Pulse Rate 02/04/23 1448 96     Resp 02/04/23 1448 20     Temp 02/04/23 1448 97.7 F (36.5 C)     Temp Source 02/04/23 1448 Oral  SpO2 02/04/23 1448 96 %     Weight --      Height --      Head Circumference --      Peak Flow --      Pain Score 02/04/23 1447 0     Pain Loc --      Pain Education --      Exclude from Growth Chart --    No data found.  Updated Vital Signs BP 111/77 (BP Location: Right Arm)   Pulse 96   Temp 97.7 F (36.5 C) (Oral)   Resp 20   SpO2 96%   Visual Acuity Right Eye Distance:   Left Eye Distance:   Bilateral Distance:    Right Eye Near:   Left Eye Near:    Bilateral Near:     Physical Exam Vitals and nursing note reviewed.  Constitutional:      General: She is not in acute distress.    Appearance: She is well-developed. She is ill-appearing. She is not toxic-appearing.  HENT:     Head: Normocephalic and atraumatic.     Right Ear: Hearing, tympanic membrane, ear canal and external ear normal.     Left Ear: Hearing, tympanic membrane, ear canal and external ear normal.     Nose: Mucosal edema, congestion and rhinorrhea present. Rhinorrhea is purulent.     Right  Sinus: Maxillary sinus tenderness present. No frontal sinus tenderness.     Left Sinus: Maxillary sinus tenderness present. No frontal sinus tenderness.     Mouth/Throat:     Lips: Pink.     Mouth: Mucous membranes are moist.     Pharynx: Uvula midline. No oropharyngeal exudate or posterior oropharyngeal erythema.     Tonsils: No tonsillar exudate.  Eyes:     Conjunctiva/sclera: Conjunctivae normal.     Pupils: Pupils are equal, round, and reactive to light.  Cardiovascular:     Rate and Rhythm: Normal rate and regular rhythm.     Heart sounds: S1 normal and S2 normal. No murmur heard. Pulmonary:     Effort: Pulmonary effort is normal. No respiratory distress.     Breath sounds: Normal breath sounds. No decreased breath sounds, wheezing, rhonchi or rales.  Abdominal:     Palpations: Abdomen is soft.     Tenderness: There is no abdominal tenderness.  Musculoskeletal:        General: No swelling.     Cervical back: Neck supple.  Lymphadenopathy:     Head:     Right side of head: No submental, submandibular, tonsillar, preauricular or posterior auricular adenopathy.     Left side of head: No submental, submandibular, tonsillar, preauricular or posterior auricular adenopathy.     Cervical: Cervical adenopathy present.     Right cervical: Superficial cervical adenopathy present.     Left cervical: Superficial cervical adenopathy present.  Skin:    General: Skin is warm and dry.     Capillary Refill: Capillary refill takes less than 2 seconds.     Findings: No rash.  Neurological:     Mental Status: She is alert and oriented to person, place, and time.  Psychiatric:        Mood and Affect: Mood normal.      UC Treatments / Results  Labs (all labs ordered are listed, but only abnormal results are displayed) Labs Reviewed  POCT URINALYSIS DIP (MANUAL ENTRY) - Normal    EKG   Radiology No results found.  Procedures Procedures (including critical  care time)  Medications  Ordered in UC Medications - No data to display  Initial Impression / Assessment and Plan / UC Course  I have reviewed the triage vital signs and the nursing notes.  Pertinent labs & imaging results that were available during my care of the patient were reviewed by me and considered in my medical decision making (see chart for details).    Exam was consistent with sinusitis.  Lungs are clear and oxygen saturation was 96 to 97%.  Will treat with cefdinir, 300 mg, twice daily for 7 days for sinusitis.  May use plain Mucinex or guaifenesin to make the nasal discharge.  Continue sinus rinses.  Discussed that cefdinir is a pregnancy category B antibiotic and should be safe especially at this time in her pregnancy.  Discouraged from using any decongestants over-the-counter as they could have effects on the baby or affect her blood pressure.  Urinalysis was normal and negative for ketones and protein.  Follow-up if symptoms do not improve, worsen or new symptoms occur. Final Clinical Impressions(s) / UC Diagnoses   Final diagnoses:  Chronic maxillary sinusitis  Acute cough  [redacted] weeks gestation of pregnancy     Discharge Instructions      Exam is consistent with maxillary sinusitis: Will treat with cefdinir, 300 mg, twice daily for 7 days.  Continue sinus rinses.  May use plain Mucinex to make mucus or nasal discharge thinner.  Urinalysis was normal with no protein or ketones.  Follow-up if symptoms do not improve, worsen or new symptoms occur.     ED Prescriptions     Medication Sig Dispense Auth. Provider   cefdinir (OMNICEF) 300 MG capsule Take 1 capsule (300 mg total) by mouth 2 (two) times daily after a meal for 7 days. 14 capsule Prescilla Sours, FNP      PDMP not reviewed this encounter.   Prescilla Sours, FNP 02/04/23 1556

## 2023-02-04 NOTE — Discharge Instructions (Addendum)
Exam is consistent with maxillary sinusitis: Will treat with cefdinir, 300 mg, twice daily for 7 days.  Continue sinus rinses.  May use plain Mucinex to make mucus or nasal discharge thinner.  Urinalysis was normal with no protein or ketones.  Follow-up if symptoms do not improve, worsen or new symptoms occur.

## 2023-02-04 NOTE — ED Triage Notes (Signed)
Pt c/o sinus congestion, green snot has been going on for couple weeks. Pt is pregnant.

## 2023-02-05 ENCOUNTER — Encounter (HOSPITAL_COMMUNITY): Payer: Self-pay | Admitting: Obstetrics and Gynecology

## 2023-02-07 ENCOUNTER — Ambulatory Visit: Payer: Managed Care, Other (non HMO) | Admitting: Family Medicine

## 2023-02-07 ENCOUNTER — Other Ambulatory Visit (HOSPITAL_COMMUNITY)
Admission: RE | Admit: 2023-02-07 | Discharge: 2023-02-07 | Disposition: A | Discharge status: Home / Self Care | Payer: Managed Care, Other (non HMO) | Source: Ambulatory Visit | Attending: Family Medicine | Admitting: Family Medicine

## 2023-02-07 VITALS — BP 109/66 | HR 84 | Wt 226.0 lb

## 2023-02-07 DIAGNOSIS — Z8279 Family history of other congenital malformations, deformations and chromosomal abnormalities: Secondary | ICD-10-CM

## 2023-02-07 DIAGNOSIS — O365930 Maternal care for other known or suspected poor fetal growth, third trimester, not applicable or unspecified: Secondary | ICD-10-CM

## 2023-02-07 DIAGNOSIS — Z3480 Encounter for supervision of other normal pregnancy, unspecified trimester: Secondary | ICD-10-CM | POA: Insufficient documentation

## 2023-02-07 NOTE — Progress Notes (Signed)
   PRENATAL VISIT NOTE  Subjective:  Sheila Kirby is a 28 y.o. [phone] at [redacted]w[redacted]d being seen today for ongoing prenatal care.  She is currently monitored for the following issues for this high-risk pregnancy and has Family history of spina bifida; Cardiac murmur; Supervision of other normal pregnancy, antepartum; and Poor fetal growth affecting management of mother in third trimester on their problem list.  Patient reports no complaints.  Contractions: Irregular. Vag. Bleeding: Scant (spotting this week).  Movement: Present. Denies leaking of fluid.   The following portions of the patient's history were reviewed and updated as appropriate: allergies, current medications, past family history, past medical history, past social history, past surgical history and problem list.   Objective:   Vitals:   02/07/23 0832  BP: 109/66  Pulse: 84  Weight: 226 lb (102.5 kg)    Fetal Status: Fetal Heart Rate (bpm): 134 Fundal Height: 35 cm Movement: Present  Presentation: Vertex  General:  Alert, oriented and cooperative. Patient is in no acute distress.  Skin: Skin is warm and dry. No rash noted.   Cardiovascular: Normal heart rate noted  Respiratory: Normal respiratory effort, no problems with respiration noted  Abdomen: Soft, gravid, appropriate for gestational age.  Pain/Pressure: Absent     Pelvic: Cervical exam performed in the presence of a chaperone Dilation: 1.5 Effacement (%): 80 Station: -2  Extremities: Normal range of motion.     Mental Status: Normal mood and affect. Normal behavior. Normal judgment and thought content.   Assessment and Plan:  Pregnancy: G4P3003 at [redacted]w[redacted]d 1. Supervision of other normal pregnancy, antepartum (Primary) FHT normal  2. Poor fetal growth affecting management of mother in third trimester, single or unspecified fetus US  tomorrow  3. Family history of spina bifida   Term labor symptoms and general obstetric precautions including but not limited to  vaginal bleeding, contractions, leaking of fluid and fetal movement were reviewed in detail with the patient. Please refer to After Visit Summary for other counseling recommendations.   No follow-ups on file.  Future Appointments  Date Time Provider Department Center  02/08/2023  7:30 AM [facility] US3 [facility]  02/20/2023  8:15 AM Rashan Patient J, DO [facility] None    Story Conti J Ezrah Dembeck, DO

## 2023-02-07 NOTE — Addendum Note (Signed)
 Addended by: Dot Gazella on: 02/07/2023 09:08 AM   Modules accepted: Orders

## 2023-02-08 ENCOUNTER — Telehealth: Payer: Self-pay | Admitting: Family Medicine

## 2023-02-08 ENCOUNTER — Ambulatory Visit: Payer: Managed Care, Other (non HMO) | Attending: Maternal & Fetal Medicine

## 2023-02-08 ENCOUNTER — Ambulatory Visit: Payer: Managed Care, Other (non HMO) | Attending: Obstetrics and Gynecology | Admitting: Obstetrics and Gynecology

## 2023-02-08 VITALS — BP 128/61

## 2023-02-08 DIAGNOSIS — O99213 Obesity complicating pregnancy, third trimester: Secondary | ICD-10-CM

## 2023-02-08 DIAGNOSIS — R011 Cardiac murmur, unspecified: Secondary | ICD-10-CM | POA: Diagnosis not present

## 2023-02-08 DIAGNOSIS — E6689 Other obesity not elsewhere classified: Secondary | ICD-10-CM | POA: Diagnosis not present

## 2023-02-08 DIAGNOSIS — Z3A36 36 weeks gestation of pregnancy: Secondary | ICD-10-CM

## 2023-02-08 DIAGNOSIS — O365930 Maternal care for other known or suspected poor fetal growth, third trimester, not applicable or unspecified: Secondary | ICD-10-CM | POA: Insufficient documentation

## 2023-02-08 DIAGNOSIS — O365990 Maternal care for other known or suspected poor fetal growth, unspecified trimester, not applicable or unspecified: Secondary | ICD-10-CM | POA: Diagnosis present

## 2023-02-08 DIAGNOSIS — O4403 Placenta previa specified as without hemorrhage, third trimester: Secondary | ICD-10-CM | POA: Diagnosis not present

## 2023-02-08 DIAGNOSIS — O99891 Other specified diseases and conditions complicating pregnancy: Secondary | ICD-10-CM

## 2023-02-08 DIAGNOSIS — E669 Obesity, unspecified: Secondary | ICD-10-CM

## 2023-02-08 LAB — GC/CHLAMYDIA PROBE AMP (~~LOC~~) NOT AT ARMC
Chlamydia: NEGATIVE
Comment: NEGATIVE
Comment: NORMAL
Neisseria Gonorrhea: NEGATIVE

## 2023-02-08 NOTE — Telephone Encounter (Signed)
 Called patient about US  results. Has resolved FGR, but still having chronically decreased fetal movement. I talked about options of waiting until 40 weeks, but this makes her very anxious. MFM did offer induction at 38 weeks if she is still anxious, which she would like. Will schedule induction at 38 weeks.  Alisan Dokes J Joriel Streety, DO

## 2023-02-08 NOTE — Progress Notes (Signed)
 Maternal-Fetal Medicine Consultation I had the pleasure of seeing Ms. Leianna Ono today at [facility].  She is here for fetal growth assessment.  On previous fetal growth assessment 3 weeks ago, the estimated fetal weight was at the 9th percentile.  Placenta previa had resolved.  Patient does not have gestational diabetes or hypertension.  Blood pressure today at our office is 128/61 mmHg.  Obstetric history is significant for 3 term vaginal deliveries.  Her children weighed 7 pounds to 8 pounds at birth.  Ultrasound Fetal growth is appropriate for gestational age.  Head circumference measurement is at between -1 and -2 SD (normal).  Amniotic fluid is normal and good fetal activity seen.  Cephalic presentation. Antenatal testing reassuring.  BPP 8/8.  Umbilical artery Doppler, performed because of previously seen fetal growth restriction, showed normal forward diastolic flow.  Patient had questions about timing of delivery.  I reassured her that in the absence of fetal growth restriction, delivery can be deferred to [redacted] weeks gestation.  There is maternal anxiety because of previous finding of fetal growth restriction, induction of labor is reasonable at [redacted] weeks gestation.  I counseled the patient that early term delivery (37 0/7-38 6/7 weeks) can be associated with increased risk of respiratory distress syndrome and NICU admissions.  Recommendations -No follow-up appointments were made.  Thank you for consultation.  If you have any questions or concerns, please contact me the [facility].  Consultation including face-to-face (more than 50%) counseling 20 minutes.

## 2023-02-08 NOTE — Progress Notes (Unsigned)
  Maternal-Fetal Medicine  I had the pleasure of seeing Ms. Sheila Kirby today at the [facility].  She is here for fetal growth assessment.  On previous fetal growth assessment 3 weeks ago, the estimated fetal weight was at the 9th percentile.  Placenta previa had resolved. Patient does not have gestational diabetes or hypertension.  Blood pressure today at our office is 128/61 mmHg. Obstetric history is significant for 3 term vaginal deliveries.  Her children weighed 7 pounds to 8 pounds at birth.  Ultrasound Fetal growth is appropriate for gestational age.  Head circumference measurement is at between -1 and -2 SD (normal).  Amniotic fluid is normal and good fetal activity seen.  Cephalic presentation.  Antenatal testing reassuring.  BPP 8/8.  Umbilical artery Doppler, performed because of previously seen fetal growth restriction, showed normal forward diastolic flow.  Patient had questions about timing of delivery.  I reassured her that in the absence of fetal growth restriction, delivery can be deferred to [redacted] weeks gestation.  There is maternal anxiety because of previous finding of fetal growth restriction, induction of labor is reasonable at [redacted] weeks gestation. I counseled the patient that early term delivery (37 0/7-38 6/7 weeks) can be associated with increased risk of respiratory distress syndrome and NICU admissions.  Recommendations -No follow-up appointments were made.  Thank you for consultation.  If you have any questions or concerns, please contact me the [facility].  Consultation including face-to-face (more than 50%) counseling 20 minutes.

## 2023-02-11 ENCOUNTER — Encounter: Payer: Self-pay | Admitting: Family Medicine

## 2023-02-11 LAB — CULTURE, BETA STREP (GROUP B ONLY): Strep Gp B Culture: NEGATIVE

## 2023-02-13 ENCOUNTER — Telehealth (HOSPITAL_COMMUNITY): Payer: Self-pay | Admitting: *Deleted

## 2023-02-13 ENCOUNTER — Encounter (HOSPITAL_COMMUNITY): Payer: Self-pay

## 2023-02-13 ENCOUNTER — Ambulatory Visit: Payer: Managed Care, Other (non HMO) | Admitting: Family Medicine

## 2023-02-13 VITALS — BP 110/61 | HR 107 | Wt 226.0 lb

## 2023-02-13 DIAGNOSIS — O365930 Maternal care for other known or suspected poor fetal growth, third trimester, not applicable or unspecified: Secondary | ICD-10-CM | POA: Diagnosis not present

## 2023-02-13 DIAGNOSIS — Z3A37 37 weeks gestation of pregnancy: Secondary | ICD-10-CM | POA: Diagnosis not present

## 2023-02-13 DIAGNOSIS — Z8279 Family history of other congenital malformations, deformations and chromosomal abnormalities: Secondary | ICD-10-CM

## 2023-02-13 DIAGNOSIS — Z3480 Encounter for supervision of other normal pregnancy, unspecified trimester: Secondary | ICD-10-CM | POA: Diagnosis not present

## 2023-02-13 NOTE — Progress Notes (Signed)
Chart reviewed - agree with CMA/RN documentation.

## 2023-02-13 NOTE — Progress Notes (Signed)
   PRENATAL VISIT NOTE  Subjective:  Sheila Kirby is a 28 y.o. [phone] at [redacted]w[redacted]d being seen today for ongoing prenatal care.  She is currently monitored for the following issues for this high-risk pregnancy and has Family history of spina bifida; Cardiac murmur; Supervision of other normal pregnancy, antepartum; and Poor fetal growth affecting management of mother in third trimester on their problem list.  Patient reports  continued chronic decreased fetal movement .  Contractions: Irritability. Vag. Bleeding: Scant.  Movement: Present. Denies leaking of fluid.   The following portions of the patient's history were reviewed and updated as appropriate: allergies, current medications, past family history, past medical history, past social history, past surgical history and problem list.   Objective:   Vitals:   02/13/23 0915  BP: 110/61  Pulse: (!) 107  Weight: 226 lb (102.5 kg)    Fetal Status: Fetal Heart Rate (bpm): 146   Movement: Present     General:  Alert, oriented and cooperative. Patient is in no acute distress.  Skin: Skin is warm and dry. No rash noted.   Cardiovascular: Normal heart rate noted  Respiratory: Normal respiratory effort, no problems with respiration noted  Abdomen: Soft, gravid, appropriate for gestational age.  Pain/Pressure: Present     Pelvic: Cervical exam deferred        Extremities: Normal range of motion.  Edema: None  Mental Status: Normal mood and affect. Normal behavior. Normal judgment and thought content.   Assessment and Plan:  Pregnancy: G4P3003 at [redacted]w[redacted]d 1. [redacted] weeks gestation of pregnancy (Primary)  2. Supervision of other normal pregnancy, antepartum FHT normal  3. Poor fetal growth affecting management of mother in third trimester, single or unspecified fetus Resolved. However, patient extremely anxious due to previous FGR and chronic decreased fetal movement. MFM agreed to delivery at 38 weeks if she still desired, which she does.  Patient scheduled for induction next week.  4. Family history of spina bifida   Term labor symptoms and general obstetric precautions including but not limited to vaginal bleeding, contractions, leaking of fluid and fetal movement were reviewed in detail with the patient. Please refer to After Visit Summary for other counseling recommendations.   No follow-ups on file.  Future Appointments  Date Time Provider Department Center  02/20/2023  7:15 AM [facility] SCHED ROOM [facility] None  03/28/2023  8:55 AM [name], DO [facility] None    [name], DO

## 2023-02-13 NOTE — Telephone Encounter (Signed)
Preadmission screen

## 2023-02-14 ENCOUNTER — Telehealth (HOSPITAL_COMMUNITY): Payer: Self-pay | Admitting: *Deleted

## 2023-02-14 ENCOUNTER — Encounter (HOSPITAL_COMMUNITY): Payer: Self-pay | Admitting: *Deleted

## 2023-02-14 NOTE — Telephone Encounter (Signed)
Preadmission screen

## 2023-02-19 NOTE — Addendum Note (Signed)
 Addended by: [name] on: 02/19/2023 08:42 PM   Modules accepted: Orders

## 2023-02-20 ENCOUNTER — Encounter: Payer: Managed Care, Other (non HMO) | Admitting: Family Medicine

## 2023-02-20 ENCOUNTER — Inpatient Hospital Stay (HOSPITAL_COMMUNITY): Payer: Managed Care, Other (non HMO)

## 2023-02-20 ENCOUNTER — Encounter (HOSPITAL_COMMUNITY): Payer: Self-pay | Admitting: Family Medicine

## 2023-02-20 ENCOUNTER — Other Ambulatory Visit: Payer: Self-pay

## 2023-02-20 ENCOUNTER — Inpatient Hospital Stay (HOSPITAL_COMMUNITY)
Admission: RE | Admit: 2023-02-20 | Discharge: 2023-02-22 | DRG: 807 | Disposition: A | Discharge status: Home / Self Care | Payer: Managed Care, Other (non HMO) | Attending: Family Medicine | Admitting: Family Medicine

## 2023-02-20 DIAGNOSIS — Z833 Family history of diabetes mellitus: Secondary | ICD-10-CM

## 2023-02-20 DIAGNOSIS — O368130 Decreased fetal movements, third trimester, not applicable or unspecified: Secondary | ICD-10-CM | POA: Diagnosis not present

## 2023-02-20 DIAGNOSIS — Z3480 Encounter for supervision of other normal pregnancy, unspecified trimester: Principal | ICD-10-CM

## 2023-02-20 DIAGNOSIS — Z8249 Family history of ischemic heart disease and other diseases of the circulatory system: Secondary | ICD-10-CM

## 2023-02-20 DIAGNOSIS — Z3A38 38 weeks gestation of pregnancy: Secondary | ICD-10-CM | POA: Diagnosis not present

## 2023-02-20 DIAGNOSIS — O365930 Maternal care for other known or suspected poor fetal growth, third trimester, not applicable or unspecified: Secondary | ICD-10-CM | POA: Diagnosis not present

## 2023-02-20 DIAGNOSIS — Z3490 Encounter for supervision of normal pregnancy, unspecified, unspecified trimester: Secondary | ICD-10-CM | POA: Diagnosis present

## 2023-02-20 LAB — CBC
HCT: 33.9 % — ABNORMAL LOW (ref 36.0–46.0)
Hemoglobin: 11.6 g/dL — ABNORMAL LOW (ref 12.0–15.0)
MCH: 29.2 pg (ref 26.0–34.0)
MCHC: 34.2 g/dL (ref 30.0–36.0)
MCV: 85.4 fL (ref 80.0–100.0)
Platelets: 172 10*3/uL (ref 150–400)
RBC: 3.97 MIL/uL (ref 3.87–5.11)
RDW: 12.4 % (ref 11.5–15.5)
WBC: 8 10*3/uL (ref 4.0–10.5)
nRBC: 0 % (ref 0.0–0.2)

## 2023-02-20 LAB — TYPE AND SCREEN
ABO/RH(D): O POS
Antibody Screen: NEGATIVE

## 2023-02-20 LAB — RPR: RPR Ser Ql: NONREACTIVE

## 2023-02-20 MED ORDER — MISOPROSTOL 50MCG HALF TABLET
50.0000 ug | ORAL_TABLET | Freq: Once | ORAL | Status: AC
Start: 2023-02-20 — End: 2023-02-20
  Administered 2023-02-20: 50 ug via ORAL
  Filled 2023-02-20 (×2): qty 1

## 2023-02-20 MED ORDER — OXYTOCIN-SODIUM CHLORIDE 30-0.9 UT/500ML-% IV SOLN
2.5000 [IU]/h | INTRAVENOUS | Status: DC
  Administered 2023-02-20: 2.5 [IU]/h via INTRAVENOUS
  Filled 2023-02-20: qty 500

## 2023-02-20 MED ORDER — IBUPROFEN 600 MG PO TABS
600.0000 mg | ORAL_TABLET | Freq: Four times a day (QID) | ORAL | Status: DC
  Administered 2023-02-20 – 2023-02-22 (×6): 600 mg via ORAL
  Filled 2023-02-20 (×6): qty 1

## 2023-02-20 MED ORDER — ONDANSETRON HCL 4 MG/2ML IJ SOLN
4.0000 mg | Freq: Four times a day (QID) | INTRAMUSCULAR | Status: DC | PRN
Start: 2023-02-20 — End: 2023-02-20

## 2023-02-20 MED ORDER — DIBUCAINE (PERIANAL) 1 % EX OINT: 1.0000 | TOPICAL_OINTMENT | CUTANEOUS | Status: DC | PRN

## 2023-02-20 MED ORDER — MISOPROSTOL 25 MCG QUARTER TABLET
25.0000 ug | ORAL_TABLET | Freq: Once | ORAL | Status: AC
Start: 2023-02-20 — End: 2023-02-20
  Administered 2023-02-20: 25 ug via VAGINAL
  Filled 2023-02-20: qty 1

## 2023-02-20 MED ORDER — MISOPROSTOL 25 MCG QUARTER TABLET: 25.0000 ug | ORAL_TABLET | ORAL | Status: DC | PRN

## 2023-02-20 MED ORDER — COCONUT OIL OIL: 1.0000 | TOPICAL_OIL | Status: DC | PRN

## 2023-02-20 MED ORDER — TERBUTALINE SULFATE 1 MG/ML IJ SOLN: 0.2500 mg | Freq: Once | INTRAMUSCULAR | Status: DC | PRN

## 2023-02-20 MED ORDER — LACTATED RINGERS IV SOLN: INTRAVENOUS | Status: DC

## 2023-02-20 MED ORDER — TETANUS-DIPHTH-ACELL PERTUSSIS 5-2.5-18.5 LF-MCG/0.5 IM SUSY: 0.5000 mL | PREFILLED_SYRINGE | Freq: Once | INTRAMUSCULAR | Status: DC

## 2023-02-20 MED ORDER — BENZOCAINE-MENTHOL 20-0.5 % EX AERO: 1.0000 | INHALATION_SPRAY | CUTANEOUS | Status: DC | PRN

## 2023-02-20 MED ORDER — MISOPROSTOL 50MCG HALF TABLET
50.0000 ug | ORAL_TABLET | ORAL | Status: DC | PRN
  Administered 2023-02-20: 50 ug via ORAL
  Filled 2023-02-20: qty 1

## 2023-02-20 MED ORDER — ACETAMINOPHEN 325 MG PO TABS
650.0000 mg | ORAL_TABLET | ORAL | Status: DC | PRN
  Administered 2023-02-21: 650 mg via ORAL
  Filled 2023-02-20 (×2): qty 2

## 2023-02-20 MED ORDER — ZOLPIDEM TARTRATE 5 MG PO TABS: 5.0000 mg | ORAL_TABLET | Freq: Every evening | ORAL | Status: DC | PRN

## 2023-02-20 MED ORDER — SENNOSIDES-DOCUSATE SODIUM 8.6-50 MG PO TABS: 2.0000 | ORAL_TABLET | Freq: Every day | ORAL | Status: DC

## 2023-02-20 MED ORDER — OXYCODONE-ACETAMINOPHEN 5-325 MG PO TABS: 2.0000 | ORAL_TABLET | ORAL | Status: DC | PRN

## 2023-02-20 MED ORDER — SENNOSIDES-DOCUSATE SODIUM 8.6-50 MG PO TABS
2.0000 | ORAL_TABLET | Freq: Every day | ORAL | Status: DC
  Administered 2023-02-20 – 2023-02-22 (×2): 2 via ORAL
  Filled 2023-02-20 (×2): qty 2

## 2023-02-20 MED ORDER — OXYCODONE-ACETAMINOPHEN 5-325 MG PO TABS: 1.0000 | ORAL_TABLET | ORAL | Status: DC | PRN

## 2023-02-20 MED ORDER — SIMETHICONE 80 MG PO CHEW: 80.0000 mg | CHEWABLE_TABLET | ORAL | Status: DC | PRN

## 2023-02-20 MED ORDER — ONDANSETRON HCL 4 MG PO TABS: 4.0000 mg | ORAL_TABLET | ORAL | Status: DC | PRN

## 2023-02-20 MED ORDER — WITCH HAZEL-GLYCERIN EX PADS: 1.0000 | MEDICATED_PAD | CUTANEOUS | Status: DC | PRN

## 2023-02-20 MED ORDER — LIDOCAINE HCL (PF) 1 % IJ SOLN: 30.0000 mL | INTRAMUSCULAR | Status: DC | PRN

## 2023-02-20 MED ORDER — FENTANYL CITRATE (PF) 100 MCG/2ML IJ SOLN: 50.0000 ug | INTRAMUSCULAR | Status: DC | PRN

## 2023-02-20 MED ORDER — ONDANSETRON HCL 4 MG/2ML IJ SOLN: 4.0000 mg | INTRAMUSCULAR | Status: DC | PRN

## 2023-02-20 MED ORDER — DIPHENHYDRAMINE HCL 25 MG PO CAPS: 25.0000 mg | ORAL_CAPSULE | Freq: Four times a day (QID) | ORAL | Status: DC | PRN

## 2023-02-20 MED ORDER — LACTATED RINGERS IV SOLN: 500.0000 mL | INTRAVENOUS | Status: DC | PRN

## 2023-02-20 MED ORDER — OXYTOCIN BOLUS FROM INFUSION
333.0000 mL | Freq: Once | INTRAVENOUS | Status: AC
  Administered 2023-02-20: 333 mL via INTRAVENOUS

## 2023-02-20 MED ORDER — PRENATAL MULTIVITAMIN CH: 1.0000 | ORAL_TABLET | Freq: Every day | ORAL | Status: DC

## 2023-02-20 MED ORDER — ACETAMINOPHEN 325 MG PO TABS: 650.0000 mg | ORAL_TABLET | ORAL | Status: DC | PRN

## 2023-02-20 MED ORDER — PRENATAL MULTIVITAMIN CH
1.0000 | ORAL_TABLET | Freq: Every day | ORAL | Status: DC
  Administered 2023-02-22: 1 via ORAL
  Filled 2023-02-20: qty 1

## 2023-02-20 MED ORDER — SOD CITRATE-CITRIC ACID 500-334 MG/5ML PO SOLN: 30.0000 mL | ORAL | Status: DC | PRN

## 2023-02-20 NOTE — Discharge Summary (Signed)
 Postpartum Discharge Summary  Date of Service updated***     Patient Name: Sheila Kirby DOB: [birth year] MRN: [id number]  Date of admission: 02/20/2023 Delivery date:02/20/2023 Delivering provider:   Date of discharge: 02/20/2023  Admitting diagnosis: Encounter for induction of labor [Z34.90] Intrauterine pregnancy: [redacted]w[redacted]d     Secondary diagnosis:  Active Problems:   Encounter for induction of labor Resolved FGR  Additional problems: none    Discharge diagnosis: Term Pregnancy Delivered                                              Post partum procedures: none Augmentation: AROM and Cytotec Complications: None  Hospital course: Induction of Labor With Vaginal Delivery   28 y.o. yo [phone] at [redacted]w[redacted]d was admitted to the hospital 02/20/2023 for induction of labor.  Indication for induction:  resolved FGR, decreased fetal movement .  Patient had an labor course that was uncomplicated. Membrane Rupture Time/Date: 12:26 PM,02/20/2023  Delivery Method: SVD Operative Delivery:N/A Episiotomy:  none Lacerations:   none Details of delivery can be found in separate delivery note.  Patient had a postpartum course complicated by***. Patient is discharged home 02/20/23.  Newborn Data: Birth date:02/20/2023 Birth time:4:33 PM Gender:Female Living status:  Apgars: ,  Weight:3200 g  Magnesium Sulfate received: No BMZ received: No Rhophylac:N/A MMR:N/A T-DaP: declined prenatally Flu: No RSV Vaccine received: No Transfusion:No  Immunizations received: Immunization History  Administered Date(s) Administered   Influenza,inj,Quad PF,6+ Mos 12/20/2014, 10/25/2017, 10/30/2018   Tdap 03/31/2015, 10/03/2017    Physical exam  Vitals:   02/20/23 1110 02/20/23 1330 02/20/23 1510 02/20/23 1512  BP: 132/68 125/65  120/65  Pulse: 86 82  69  Resp: 18 18  18   Temp:  98.2 F (36.8 C)  98.4 F (36.9 C)  TempSrc:  Oral  Oral  SpO2:   99%   Weight:      Height:       General: {Exam;  general:21111117} Lochia: {Desc; appropriate/inappropriate:30686::"appropriate"} Uterine Fundus: {Desc; firm/soft:30687} Incision: {Exam; incision:21111123} DVT Evaluation: {Exam; dvt:2111122} Labs: Lab Results  Component Value Date   WBC 8.0 02/20/2023   HGB 11.6 (L) 02/20/2023   HCT 33.9 (L) 02/20/2023   MCV 85.4 02/20/2023   PLT 172 02/20/2023      Latest Ref Rng & Units 12/13/2022    1:47 PM  CMP  Glucose 70 - 99 mg/dL 76   BUN 6 - 20 mg/dL 7   Creatinine 3.08 - 6.57 mg/dL 8.46   Sodium 962 - 952 mmol/L 140   Potassium 3.5 - 5.2 mmol/L 4.3   Chloride 96 - 106 mmol/L 104   CO2 20 - 29 mmol/L 22   Calcium 8.7 - 10.2 mg/dL 9.0   Total Protein 6.0 - 8.5 g/dL 6.1   Total Bilirubin 0.0 - 1.2 mg/dL 0.2   Alkaline Phos 44 - 121 IU/L 109   AST 0 - 40 IU/L 15   ALT 0 - 32 IU/L 22    Edinburgh Score:    02/13/2019    9:15 AM  Edinburgh Postnatal Depression Scale Screening Tool  I have been able to laugh and see the funny side of things. 0  I have looked forward with enjoyment to things. 0  I have blamed myself unnecessarily when things went wrong. 0  I have been anxious or worried for no good reason.  2  I have felt scared or panicky for no good reason. 2  Things have been getting on top of me. 0  I have been so unhappy that I have had difficulty sleeping. 0  I have felt sad or miserable. 0  I have been so unhappy that I have been crying. 0  The thought of harming myself has occurred to me. 0  Edinburgh Postnatal Depression Scale Total 4   No data recorded  After visit meds:  Allergies as of 02/20/2023   No Known Allergies   Med Rec must be completed prior to using this [facility]***        Discharge home in stable condition Infant Feeding: {Baby feeding:23562} Infant Disposition:{CHL IP OB HOME WITH YNWGNF:62130} Discharge instruction: per After Visit Summary and Postpartum booklet. Activity: Advance as tolerated. Pelvic rest for 6 weeks.  Diet: routine  diet Future Appointments: Future Appointments  Date Time Provider Department Center  03/28/2023  8:55 AM [name], DO [facility] None   Follow up Visit: has postpartum visit scheduled   02/20/2023 [name], DO

## 2023-02-20 NOTE — Progress Notes (Signed)
 Patient ID: Sheila Kirby, female   DOB: 05/25/1995, 28 y.o.   MRN: [id number]  Feeling more pressure. Using nitrous.  BP 120/65   Pulse 69   Temp 98.4 F (36.9 C) (Oral)   Resp 18   Ht 5\' 9"  (1.753 m)   Wt 101.8 kg   LMP 05/21/2022   SpO2 99%   BMI 33.15 kg/m   Dilation: 5.5 Effacement (%): 90 Cervical Position: Middle Station: -1 Presentation: Vertex Exam by:: Chelsea RN  Moderate variability, a few variable decelerations over last 20 minutes. Accels prior to that.  Continue expectant management.  [name], DO

## 2023-02-20 NOTE — H&P (Signed)
 OBSTETRIC ADMISSION HISTORY AND PHYSICAL  Sheila Kirby is a 28 y.o. female [phone] with IUP at [redacted]w[redacted]d by 8 weeks [place] presenting for IOL per MFM for FGR (resolved) and chronic DFM. She reports +FMs, No LOF, no VB, no blurry vision, headaches or peripheral edema, and RUQ pain.  She plans on breast feeding. She is unsure about birth control. She received her prenatal care at  [facility]    Dating: By 8 week [place] --->  Estimated Date of Delivery: 03/06/23  Sono:   @[redacted]w[redacted]d , CWD, normal anatomy, cephalic presentation, posterior placental lie, 2779 gm 6 lb 2 oz 40 %  EFW   Prenatal History/Complications:  - Poor fetal growth affecting management of mother in third trimester, single or unspecified fetus Resolved. However, patient extremely anxious due to previous FGR and chronic decreased fetal movement. MFM agreed to delivery at 38 weeks if she still desired, which she does. Patient scheduled for induction next week.  - Placenta previa, resolved - Family history of spina bifida   Past Medical History: Past Medical History:  Diagnosis Date   Abnormal uterine bleeding, postpartum 01/30/2019   Cardiac murmur 12/17/2018   Family history of spina bifida 11/15/2014   Sore throat 12/24/2018   Symptoms of upper respiratory infection (URI) 12/24/2018    Past Surgical History: Past Surgical History:  Procedure Laterality Date   DILATION AND EVACUATION N/A 01/30/2019   Procedure: DILATATION AND EVACUATION;  Surgeon: Adam Phenix, MD;  Location: [facility];  Service: Gynecology;  Laterality: N/A;   WISDOM TOOTH EXTRACTION      Obstetrical History: OB History     Gravida  4   Para  3   Term  3   Preterm  0   AB  0   Living  3      SAB  0   IAB  0   Ectopic  0   Multiple      Live Births  3           Social History Social History   Socioeconomic History   Marital status: Married    Spouse name: Not on file   Number of children: Not on file    Years of education: Not on file   Highest education level: Not on file  Occupational History   Not on file  Tobacco Use   Smoking status: Never   Smokeless tobacco: Never  Vaping Use   Vaping status: Never Used  Substance and Sexual Activity   Alcohol use: Never   Drug use: Never   Sexual activity: Yes  Other Topics Concern   Not on file  Social History Narrative   ** Merged History Encounter **       Social Drivers of Health   Financial Resource Strain: Not on file  Food Insecurity: Unknown (01/27/2023)   Hunger Vital Sign    Worried About Running Out of Food in the Last Year: Not on file    Ran Out of Food in the Last Year: Never true  Transportation Needs: No Transportation Needs (01/27/2023)   PRAPARE - Administrator, Civil Service (Medical): No    Lack of Transportation (Non-Medical): No  Physical Activity: Not on file  Stress: Not on file  Social Connections: Not on file    Family History: Family History  Problem Relation Age of Onset   Hypertension Mother    Thyroid disease Mother    Diabetes Father    Hypertension  Father    Cancer Sister        skin cancer   Cancer Maternal Grandfather        skin   Heart attack Paternal Grandmother    Cancer Paternal Grandfather        renal   Heart attack Maternal Aunt    Heart attack Maternal Aunt    Heart attack Maternal Aunt    Spina bifida Paternal Uncle    Stroke Neg Hx    Miscarriages / Stillbirths Neg Hx    Mental retardation Neg Hx     Allergies: No Known Allergies  Medications Prior to Admission  Medication Sig Dispense Refill Last Dose/Taking   Prenatal Vit-Fe Fumarate-FA (PRENATAL MULTIVITAMIN) TABS tablet Take 1 tablet by mouth daily at 12 noon.        Review of Systems   All systems reviewed and negative except as stated in HPI  Height 5\' 9"  (1.753 m), weight 101.8 kg, last menstrual period 05/21/2022. General appearance:  Lungs: clear to auscultation bilaterally Heart: regular  rate and rhythm Abdomen: soft, non-tender; bowel sounds normal Pelvic: 3/80/02 Extremities: Homans sign is negative, no sign of DVT Presentation: cephalic per rn exam Fetal monitoring: 140/mod/-a/-d Uterine activityNone     Prenatal labs: ABO, Rh:  o pos Antibody:  neg Rubella: Immune (07/29 0000) RPR: Non Reactive (12/12 1347)  HBsAg: Negative (07/26 0000)  HIV: Non Reactive (12/12 1347)  GBS: Negative/-- (02/06 0909)    Lab Results  Component Value Date   GBS Negative 02/07/2023   GTT 75/101/81; normal Genetic screening LR female, negative horizon Anatomy US normal  Immunization History  Administered Date(s) Administered   Influenza,inj,Quad PF,6+ Mos 12/20/2014, 10/25/2017, 10/30/2018   Tdap 03/31/2015, 10/03/2017    Prenatal Transfer Tool  Maternal Diabetes: No Genetic Screening: Normal Maternal Ultrasounds/Referrals: Normal Fetal Ultrasounds or other Referrals:  None Maternal Substance Abuse:  No Significant Maternal Medications:  None Significant Maternal Lab Results: Group B Strep negative Number of Prenatal Visits:greater than 3 verified prenatal visits Maternal Vaccinations: None Other Comments:  None   No results found for this or any previous visit (from the past 24 hours).  Patient Active Problem List   Diagnosis Date Noted   Encounter for induction of labor 02/20/2023   Poor fetal growth affecting management of mother in third trimester 01/18/2023   Supervision of other normal pregnancy, antepartum 08/23/2022   Cardiac murmur 12/17/2018   Family history of spina bifida 11/15/2014    Assessment/Plan:  Sheila Kirby is a 28 y.o. G4P3003 at [redacted]w[redacted]d here for IOL for resolved FGR and chronic DFM. MFM gave her the option of iol at 49 and she has taken that option. She is aware of neonatal risks of early term delivery.  #Labor: per patient dr. Adrian Blackwater will be managing much of labor and has made plan for cytotec to start (which has been placed) with  arom to follow around noon #Pain: Aware of options #FWB: Cat I #GBS status:  negative #Feeding: Breastmilk  #Reproductive Life planning: Undecided #Circ:  not applicable

## 2023-02-20 NOTE — Progress Notes (Signed)
 Patient ID: Sheila Kirby, female   DOB: 11/28/1995, 28 y.o.   MRN: [id number]   AROM with clear fluid. Cat 1 tracing.  Dilation: 3 Effacement (%): 80, 90 Cervical Position: Middle Station: -2 Presentation: Vertex Exam by:: Ezra Denne  Patient would like to try another dose of cytotec instead of pitocin. Cytotec PO.[name], DO

## 2023-02-21 ENCOUNTER — Encounter (HOSPITAL_COMMUNITY): Payer: Self-pay | Admitting: Family Medicine

## 2023-02-21 NOTE — Lactation Note (Signed)
 This note was copied from a baby's chart.  NICU Lactation Consultation Note  Patient Name: Sheila Kirby [id number] Date: 02/21/2023 Age:29 hours  Reason for consult: Initial assessment; NICU baby; Early term 51-38.6wks  SUBJECTIVE Visited with family of 70 80/68 weeks old NICU female; baby "Thamas Jaegers" got admitted due to respiratory distress. Ms. Nyra Market is a P4 and experienced breastfeeding. Her plan is to primarily breastfeed if possible, parents also working on bottle feedings at this time. She's already pumping and getting large amounts of colostrum, her milk is in, praised her for her efforts. Reviewed pumping schedule, pumping log, lactogenesis II/III and anticipatory guidelines.   OBJECTIVE Infant data: Mother's Current Feeding Choice: Breast Milk and Donor Milk  O2 Device: Room Air O2 Flow Rate (L/min): 4 L/min FiO2 (%): 21 %  Infant feeding assessment IDFTS - Readiness: 1 IDFTS - Quality: 1   Maternal data: [id number] Vaginal, Spontaneous Has patient been taught Hand Expression?: Yes Hand Expression Comments: colostrum noted Significant Breast History:: (+) breast changes during the pregnancy Current breast feeding challenges:: NICU admission Previous breastfeeding challenges?: Exclusive pump and bottle fed (with baby # 2 and 3) Does the patient have breastfeeding experience prior to this delivery?: Yes How long did the patient breastfeed?: 1st one for 12 months and 8-9 months with the other two Pumping frequency: initiated pumping at 8 hours post-partum Pumped volume: 30 mL (30-60 ml) Flange Size: 24 Risk factor for low/delayed milk supply:: infant separation  Pump: Personal, Hands Free (Spectra S2 and Willow wearable, Motif)  ASSESSMENT Infant: Feeding Status: Ad lib Feeding method: Bottle Nipple Type: Nfant Slow Flow (purple)  Maternal: Milk volume: Abundant  INTERVENTIONS/PLAN Interventions: Interventions: Breast feeding basics reviewed; Coconut oil; DEBP;  Education; NICU Pumping Log; CDC Guidelines for Breast Pump Cleaning Tools: Pump; Flanges; Coconut oil Pump Education: Setup, frequency, and cleaning; Milk Storage  Plan: STS whenever possible Massage both breasts and hand expression Pump every 3 hours for 15 minutes at a time Breastfeed on cues +8 times/24 hours Parents will continue working on bottle feedings  FOB present. All questions and concerns answered, family to contact [facility] services PRN.  Consult Status: NICU follow-up NICU Follow-up type: New admission follow up   Sary Bogie Venetia Constable 02/21/2023, 4:47 PM

## 2023-02-21 NOTE — Progress Notes (Signed)
 Post Partum Day #1 Subjective: no complaints, up ad lib, and tolerating PO; parents in NICU currently; daughter stable on CPAP; pumping  Objective: Blood pressure (!) 108/59, pulse 73, temperature 98.6 F (37 C), temperature source Oral, resp. rate 16, height 5\' 9"  (1.753 m), weight 101.8 kg, last menstrual period 05/21/2022, SpO2 100%, unknown if currently breastfeeding.  Physical Exam:  General: cooperative, fatigued, and no distress Lochia: appropriate Uterine Fundus: firm DVT Evaluation: No evidence of DVT seen on physical exam.  Recent Labs    02/20/23 0804  HGB 11.6*  HCT 33.9*    Assessment/Plan: Plan for discharge tomorrow and Lactation consult   LOS: 1 day   Arabella Merles, CNM 02/21/2023, 9:14 AM

## 2023-02-22 MED ORDER — IBUPROFEN 600 MG PO TABS: 600.0000 mg | ORAL_TABLET | Freq: Four times a day (QID) | ORAL | 0 refills | Status: DC | PRN

## 2023-02-22 MED ORDER — ACETAMINOPHEN 325 MG PO TABS: 650.0000 mg | ORAL_TABLET | ORAL | 1 refills | Status: AC | PRN

## 2023-02-22 NOTE — Lactation Note (Signed)
 This note was copied from a baby's chart.  NICU Lactation Consultation Note  Patient Name: Sheila Kirby [id number] Date: 02/22/2023 Age:29 hours  Reason for consult: Follow-up assessment; NICU baby; Early term 6-38.6wks; Maternal discharge  SUBJECTIVE  Baby admitted to NICU for TTN.  Baby remains with intermittent tachypnea.    LC in to visit with P4 Mom of baby "Thamas Jaegers" in the NICU. Baby is at a 7.8% weight loss.  Baby is ad lib and feeding at the breast and/or bottle of EBM+/DBM.  Mom pumped this am for 120 ml.  Baby has not been interested in bottle feeding as much and Mom states she latches beautifully and feeds consistently for 15 mins.  Mom hearing swallows and denies any nipple pain.   Mom encouraged to pump if baby is fed a bottle instead of breastfeeding or after.  Mom to pump if breasts are full and baby can not latch deeply.  Engorgement prevention and treatment reviewed.  Mom aware of lactation support available to her while baby is in the NICU.  LC will F/U at a later time to assess baby at the breast.  OBJECTIVE Infant data: Mother's Current Feeding Choice: Breast Milk and Donor Milk  O2 Device: Room Air FiO2 (%): 21 %  Infant feeding assessment IDFTS - Readiness: 1 IDFTS - Quality: 1   Maternal data: [id number] Vaginal, Spontaneous Has patient been taught Hand Expression?: Yes Hand Expression Comments: colostrum noted Significant Breast History:: (+) breast changes during the pregnancy Current breast feeding challenges:: NICU admission Previous breastfeeding challenges?: Exclusive pump and bottle fed (with baby # 2 and 3) Does the patient have breastfeeding experience prior to this delivery?: Yes How long did the patient breastfeed?: 1st one for 12 months and 8-9 months with the other two Pumping frequency: baby is breastfeeding and Mom is pumping prn due to history of oversupply of breast milk Pumped volume: 120 mL Flange Size: 24 Risk factor for  low/delayed milk supply:: infant separation  Pump: Personal, Hands Free (Spectra S2 and Willow wearable, Motif)  ASSESSMENT Infant:  Feeding Status: Ad lib Feeding method: Bottle Nipple Type: Nfant Slow Flow (purple)  Maternal: Milk volume: Normal  INTERVENTIONS/PLAN Interventions: Interventions: Breast feeding basics reviewed; Skin to skin; Breast massage; Hand express; DEBP; Education Discharge Education: Engorgement and breast care Tools: Pump; Flanges Pump Education: Milk Storage; Setup, frequency, and cleaning  Plan: Consult Status: NICU follow-up NICU Follow-up type: Verify onset of copious milk; Verify absence of engorgement   Judee Clara 02/22/2023, 11:23 AM

## 2023-02-23 ENCOUNTER — Ambulatory Visit (HOSPITAL_COMMUNITY): Payer: Self-pay

## 2023-02-23 NOTE — Lactation Note (Addendum)
 This note was copied from a baby's chart.  NICU Lactation Consultation Note  Patient Name: Sheila Kirby [id number] Date: 02/23/2023 Age:29 hours  Reason for consult: Follow-up assessment; NICU baby; Early term 25-38.6wks; Hyperbilirubinemia; Infant weight loss  SUBJECTIVE  LC in to visit with P4 Mom of baby "Thamas Jaegers" in the NICU.  Baby is ad lib feeding now.  Baby is also at an 11.4% weight loss and bilirubin level right at phototherapy light level.  Mom reports that baby has had a couple bradycardic episodes while sleeping, but it resolves quickly and no O2 desats.  Baby has had periods of tachypnea, but mostly has been resting comfortably.    Mom encouraged to increase supplementation to 30 ml EBM by paced bottle.  Mom plans to supplement first and then offer the breast.  Last feeding baby fed at the breast for 20 mins and then took 5 ml EBM by bottle.  Due to baby's weight loss and hyperbilirubinemia, LC recommended offering more volume.  Mom describes baby's latch as comfortable, hearing swallowing.  Mom is using her [facility]  hands free pump and likes it best.  She has a history of over milk supply, and she is expressing 120 ml each time.   OBJECTIVE Infant data: No data recorded O2 Device: Room Air  Infant feeding assessment IDFTS - Readiness: 1 IDFTS - Quality: 2   Maternal data: [id number] Vaginal, Spontaneous Previous breastfeeding challenges?: Other (Comment) (Mom reports she experienced over supply) Pumping frequency: 7-8 times per 24 hrs Pumped volume: 120 mL Flange Size: 24  Pump: Personal, Hands Free (Spectra S2 and Willow wearable, Motif)  ASSESSMENT Infant:  Feeding Status: Ad lib Feeding method: Bottle Nipple Type: Dr. Irving Burton Preemie  Maternal: Milk volume: Normal  INTERVENTIONS/PLAN Interventions: Interventions: Breast feeding basics reviewed; Skin to skin; Breast massage; Hand express; DEBP; Pace feeding; Education Discharge Education: Engorgement and  breast care Tools: Flanges; Pump; Bottle Pump Education: Setup, frequency, and cleaning; Milk Storage  Plan: 1- STS with baby 2- At least every 3 hrs, awaken baby for feeding due to weight loss.  Mom to offer baby > 30 ml of EBM by paced bottle 3- after supplement, baby will go to breast and feed. 4- Pump both breasts 20-30 mins after each feeding. 5- as weight increases and bilirubin level drops, baby can go to the breast exclusively. 6- ask for LC prn  Consult Status: NICU follow-up NICU Follow-up type: Baby's discharge   Judee Clara 02/23/2023, 5:31 PM

## 2023-02-25 NOTE — Social Work (Signed)
 Patient screened out for psychosocial assessment since none of the following apply: Psychosocial stressors documented in mother or baby's chart Gestation less than 32 weeks Code at delivery  Infant with anomalies Please contact the Clinical Social Worker if specific needs arise, by MOB's request, or if MOB scores greater than 9/yes to question 10 on Edinburgh Postpartum Depression Screen. MOB completed [place] with score of: 0  Sheila Kirby, MSW, LCSW Clinical Social Worker  [phone] 02/25/2023  11:03 AM

## 2023-03-01 ENCOUNTER — Telehealth (HOSPITAL_COMMUNITY): Payer: Self-pay

## 2023-03-01 DIAGNOSIS — Z1331 Encounter for screening for depression: Secondary | ICD-10-CM

## 2023-03-01 NOTE — Telephone Encounter (Signed)
 03/01/2023  Name: Sheila Kirby MRN: [id number] DOB: [birth year]  Reason for Call:  Transition of Care Hospital Discharge Call  Contact Status: Patient Contact Status: Complete  Language assistant needed:          Follow-Up Questions: Do You Have Any Concerns About Your Health As You Heal From Delivery?: No Do You Have Any Concerns About Your Infants Health?: No  Edinburgh Postnatal Depression Scale:  In the Past 7 Days: I have been able to laugh and see the funny side of things.: Definitely not so much now I have looked forward with enjoyment to things.: Definitely less than I used to I have blamed myself unnecessarily when things went wrong.: Not very often I have been anxious or worried for no good reason.: Yes, very often I have felt scared or panicky for no good reason.: Yes, sometimes Things have been getting on top of me.: Yes, sometimes I haven't been coping as well as usual I have been so unhappy that I have had difficulty sleeping.: Not very often I have felt sad or miserable.: Yes, quite often I have been so unhappy that I have been crying.: Yes, quite often The thought of harming myself has occurred to me.: Never [name] Postnatal Depression Scale Total: (!) 17  Reviewed integrated behavioral health referral with patient. Patient consents to a virtual counseling appointment. Integrated behavioral health referral placed. Patient states that she is planning on reaching out to her OB because she has been feeling like she has had increased anxiety since hospital stay. RN encouraged patient to follow through with reaching out to her OB for help.  RN told patient about Maternal Mental Health Resources ([facility], National Maternal Mental Health Hotline, Postpartum Support International, National Suicide and Crisis Lifeline). Also told patient about [facility] support group offerings. Will e-mail these resources to patient as well.  PHQ2-9 Depression Scale:      Discharge Follow-up: Edinburgh score requires follow up?: Yes Provider notified of Edinburgh score?: Yes Have you already been referred for a counseling appointment?: No Patient was advised of the following resources:: Breastfeeding Support Group, Support Group Patient referred to:: OB  Post-discharge interventions: Reviewed Newborn Safe Sleep Practices Maternal Mental Health Resources provided Placed IBH referral in patient's chart  Signature  Signe Colt

## 2023-03-05 ENCOUNTER — Encounter: Payer: Self-pay | Admitting: Family Medicine

## 2023-03-28 ENCOUNTER — Ambulatory Visit: Payer: Managed Care, Other (non HMO) | Admitting: Family Medicine

## 2023-03-28 DIAGNOSIS — F53 Postpartum depression: Secondary | ICD-10-CM | POA: Diagnosis not present

## 2023-03-28 NOTE — Progress Notes (Signed)
 Post Partum Visit Note  Sheila Kirby is a 28 y.o. [phone] female who presents for a postpartum visit. She is 5 weeks postpartum following a normal spontaneous vaginal delivery.  I have fully reviewed the prenatal and intrapartum course. The delivery was at 38 gestational weeks.  Anesthesia:  Nitrous Oxide . Postpartum course has been normal. Baby is doing well. Baby is feeding by breast. Bleeding staining only. Bowel function is normal. Bladder function is normal. Patient is not sexually active. Contraception method is none. Postpartum depression screening: positive.  She is feeling very anxious and stressed. Has no desire to return to work at this moment. Was called by Asher Muir for appt, but needs to reschedule. Does not have desire to start medication yet.  The pregnancy intention screening data noted above was reviewed. Potential methods of contraception were discussed. The patient elected to proceed with No data recorded.   Edinburgh Postnatal Depression Scale - 03/28/23 0921       Edinburgh Postnatal Depression Scale:  In the Past 7 Days   I have been able to laugh and see the funny side of things. 2    I have looked forward with enjoyment to things. 1    I have blamed myself unnecessarily when things went wrong. 1    I have been anxious or worried for no good reason. 3    I have felt scared or panicky for no good reason. 3    Things have been getting on top of me. 2    I have been so unhappy that I have had difficulty sleeping. 2    I have felt sad or miserable. 1    I have been so unhappy that I have been crying. 2    The thought of harming myself has occurred to me. 0    Edinburgh Postnatal Depression Scale Total 17             Health Maintenance Due  Topic Date Due   COVID-19 Vaccine (1 - 2024-25 season) Never done    The following portions of the patient's history were reviewed and updated as appropriate: allergies, current medications, past family history, past  medical history, past social history, past surgical history, and problem list.  Review of Systems Pertinent items are noted in HPI.  Objective:  BP 120/73 (BP Location: Left Arm, Patient Position: Sitting, Cuff Size: Large)   Pulse 66   Wt 207 lb (93.9 kg)   LMP 05/21/2022   Breastfeeding Yes   BMI 30.57 kg/m    General:  alert, cooperative, and no distress   Breasts:  not indicated  Lungs: clear to auscultation bilaterally  Heart:  regular rate and rhythm, S1, S2 normal, no murmur, click, rub or gallop  Abdomen: soft, non-tender; bowel sounds normal; no masses,  no organomegaly   Wound N/a  GU exam:  not indicated       Assessment:    1. Postpartum care and examination (Primary)  2. Postpartum depression Will see if we can get her in sooner with [facility]   Plan:   Essential components of care per ACOG recommendations:  1.  Mood and well being: Patient with positive depression screening today. Reviewed local resources for support.  - Patient tobacco use? No.   - hx of drug use? No.    2. Infant care and feeding:  -Patient currently breastmilk feeding? Yes. Reviewed importance of draining breast regularly to support lactation.  -Social determinants of health (SDOH) reviewed  in EPIC. No concerns  3. Sexuality, contraception and birth spacing - Patient does not want a pregnancy in the next year.   - Reviewed reproductive life planning. Reviewed contraceptive methods based on pt preferences and effectiveness.  Patient desired Female Condom today.   - Discussed birth spacing of 18 months  4. Sleep and fatigue -Encouraged family/partner/community support of 4 hrs of uninterrupted sleep to help with mood and fatigue  5. Physical Recovery  - Discussed patients delivery and complications. She describes her labor as good. - Patient had a Vaginal, no problems at delivery. Patient had  no laceration . Perineal healing reviewed. Patient expressed understanding - Patient has urinary  incontinence? No. - Patient is safe to resume physical and sexual activity  6.  Health Maintenance - HM due items addressed Yes - Last pap smear  Diagnosis  Date Value Ref Range Status  09/27/2016   Final   NEGATIVE FOR INTRAEPITHELIAL LESIONS OR MALIGNANCY.   Pap smear not done at today's visit.  -Breast Cancer screening indicated? No.   7. Chronic Disease/Pregnancy Condition follow up: None  - PCP follow up  [name], DO Center for [facility], [facility]

## 2023-03-29 ENCOUNTER — Encounter: Payer: Self-pay | Admitting: Family Medicine

## 2023-04-01 ENCOUNTER — Encounter: Payer: Self-pay | Admitting: Family Medicine

## 2023-04-02 ENCOUNTER — Other Ambulatory Visit (HOSPITAL_COMMUNITY): Payer: Self-pay

## 2023-04-02 ENCOUNTER — Ambulatory Visit: Admitting: Advanced Practice Midwife

## 2023-04-02 ENCOUNTER — Telehealth: Payer: Self-pay

## 2023-04-02 VITALS — BP 119/78 | HR 109 | Ht 69.0 in | Wt 203.0 lb

## 2023-04-02 DIAGNOSIS — R509 Fever, unspecified: Secondary | ICD-10-CM

## 2023-04-02 DIAGNOSIS — R109 Unspecified abdominal pain: Secondary | ICD-10-CM | POA: Diagnosis not present

## 2023-04-02 DIAGNOSIS — N61 Mastitis without abscess: Secondary | ICD-10-CM | POA: Diagnosis not present

## 2023-04-02 MED ORDER — IBUPROFEN 600 MG PO TABS
600.0000 mg | ORAL_TABLET | Freq: Four times a day (QID) | ORAL | 1 refills | Status: DC | PRN
  Filled 2023-04-02: qty 30, 8d supply, fill #0

## 2023-04-02 MED ORDER — DICLOXACILLIN SODIUM 500 MG PO CAPS
500.0000 mg | ORAL_CAPSULE | Freq: Four times a day (QID) | ORAL | 0 refills | Status: DC
  Filled 2023-04-02: qty 40, 10d supply, fill #0

## 2023-04-02 NOTE — Telephone Encounter (Signed)
 Patient sent message regarding fever and ? Mastitis.  Spoke with patient and scheduled appt to be evaluated.  Danna Hefty [employer]

## 2023-04-02 NOTE — Progress Notes (Signed)
   GYNECOLOGY OFFICE VISIT NOTE  History:   Sheila Kirby is a 28 y.o. [phone] here today for possible mastitis. Has fever and left breast pain, redness and mass since yesterday. Also having mild RLQ pain. Denies GI complaints. She is 6 weeks postpartum.   Past Medical History:  Diagnosis Date   Abnormal uterine bleeding, postpartum 01/30/2019   Cardiac murmur 12/17/2018   Family history of spina bifida 11/15/2014   Sore throat 12/24/2018   Symptoms of upper respiratory infection (URI) 12/24/2018    Past Surgical History:  Procedure Laterality Date   DILATION AND EVACUATION N/A 01/30/2019   Procedure: DILATATION AND EVACUATION;  Surgeon: Adam Phenix, MD;  Location: [facility];  Service: Gynecology;  Laterality: N/A;   WISDOM TOOTH EXTRACTION      The following portions of the patient's history were reviewed and updated as appropriate: allergies, current medications, past family history, past medical history, past social history, past surgical history and problem list.   Review of Systems:  Pertinent items noted in HPI and remainder of comprehensive ROS otherwise negative.  Physical Exam:  BP 119/78   Pulse (!) 109   Ht 5\' 9"  (1.753 m)   Wt 203 lb (92.1 kg)   LMP 05/21/2022   BMI 29.98 kg/m  CONSTITUTIONAL: Well-developed, well-nourished female in no acute distress.  SKIN: No rash noted. Not diaphoretic. No erythema. No pallor. MUSCULOSKELETAL: Normal range of motion. No edema noted. NEUROLOGIC: Alert and oriented to person, place, and time. Normal muscle tone coordination. No cranial nerve deficit noted. PSYCHIATRIC: Normal mood and affect. Normal behavior. Normal judgment and thought content. CARDIOVASCULAR: Mild tachycardia  RESPIRATORY: Effort and rate normal, no problems with respiration noted BREASTS: Erythematous, warm mass at 12 o'clock. No fluctuance.  ABDOMEN: No TTP or masses noted.   PELVIC: Deferred  Labs and Imaging No results found  for this or any previous visit (from the past week). No results found.    Assessment and Plan:  1. Acute mastitis of left breast (Primary) - dicloxacillin (DYNAPEN) 500 MG capsule; Take 1 capsule (500 mg total) by mouth 4 (four) times daily.  Dispense: 40 capsule; Refill: 0 - CBC w/Diff  2. Abdominal pain, acute - CBC w/Diff - Low suspicion for appendicitis due to location of pain and absence of GI complaints. Precautions reviewed.   3. Fever - CBC w/Diff  Routine preventative health maintenance measures emphasized. Please refer to After Visit Summary for other counseling recommendations.   F/U if no improvement in 3 days or sooner as needed for worsening Sx.  Empty breast frequently. Breast mild is safe for baby.  Ibuprofen and Tylenol for pain, fever.  Center for [employer], [facility] [place], [place] 04/02/2023 6:01 PM

## 2023-04-03 LAB — CBC WITH DIFFERENTIAL/PLATELET
Basophils Absolute: 0 10*3/uL (ref 0.0–0.2)
Basos: 0 %
EOS (ABSOLUTE): 0 10*3/uL (ref 0.0–0.4)
Eos: 0 %
Hematocrit: 41.9 % (ref 34.0–46.6)
Hemoglobin: 14 g/dL (ref 11.1–15.9)
Immature Grans (Abs): 0 10*3/uL (ref 0.0–0.1)
Immature Granulocytes: 0 %
Lymphocytes Absolute: 0.9 10*3/uL (ref 0.7–3.1)
Lymphs: 8 %
MCH: 29 pg (ref 26.6–33.0)
MCHC: 33.4 g/dL (ref 31.5–35.7)
MCV: 87 fL (ref 79–97)
Monocytes Absolute: 0.6 10*3/uL (ref 0.1–0.9)
Monocytes: 6 %
Neutrophils Absolute: 9.4 10*3/uL — ABNORMAL HIGH (ref 1.4–7.0)
Neutrophils: 86 %
Platelets: 202 10*3/uL (ref 150–450)
RBC: 4.83 x10E6/uL (ref 3.77–5.28)
RDW: 11.7 % (ref 11.7–15.4)
WBC: 11 10*3/uL — ABNORMAL HIGH (ref 3.4–10.8)

## 2023-04-05 NOTE — BH Specialist Note (Unsigned)
 Integrated Behavioral Health via Telemedicine Visit  04/05/2023 Sheila Kirby [id number]  Number of Integrated Behavioral Health Clinician visits: No data recorded Session Start time: No data recorded  Session End time: No data recorded Total time in minutes: No data recorded  Referring Provider: Tiffany Foerster, MD Patient/Family location: Home*** [facility] Provider location: [facility] at [facility]  All persons participating in visit: Patient Sheila Kirby and Sheila Kirby, LP Sheila Kirby ***  Types of Service: {CHL AMB TYPE OF SERVICE:[phone]}  I connected with Sheila Kirby and/or Sheila Kirby's {family members:20773} via  Telephone or Video Enabled Telemedicine Application  (Video is Caregility application) and verified that I am speaking with the correct person using two identifiers. Discussed confidentiality: Yes   I discussed the limitations of telemedicine and the availability of in person appointments.  Discussed there is a possibility of technology failure and discussed alternative modes of communication if that failure occurs.  I discussed that engaging in this telemedicine visit, they consent to the provision of behavioral healthcare and the services will be billed under their insurance.  Patient and/or legal guardian expressed understanding and consented to Telemedicine visit: Yes   Presenting Concerns: Patient and/or family reports the following symptoms/concerns: *** Duration of problem: ***; Severity of problem: {Mild/Moderate/Severe:20260}  Patient and/or Family's Strengths/Protective Factors: {CHL AMB BH PROTECTIVE FACTORS:[phone]}  Goals Addressed: Patient will:  Reduce symptoms of: {IBH Symptoms:21014056}   Increase knowledge and/or ability of: {IBH Patient Tools:21014057}   Demonstrate ability to: {IBH Goals:21014053}  Progress towards Goals: {CHL AMB BH PROGRESS TOWARDS  GOALS:[phone]}  Interventions: Interventions utilized:  {IBH Interventions:21014054} Standardized Assessments completed: {IBH Screening Tools:21014051}  Patient and/or Family Response: Patient agrees with treatment plan.   Assessment: Patient currently experiencing ***.   Patient may benefit from psychoeducation and brief therapeutic interventions regarding coping with symptoms of *** .  Plan: Follow up with behavioral health clinician on : *** Behavioral recommendations:  -*** -*** Referral(s): {IBH Referrals:21014055}  I discussed the assessment and treatment plan with the patient and/or parent/guardian. They were provided an opportunity to ask questions and all were answered. They agreed with the plan and demonstrated an understanding of the instructions.   They were advised to call back or seek an in-person evaluation if the symptoms worsen or if the condition fails to improve as anticipated.  Sheila Kipper, LCSW     03/28/2023    9:21 AM 03/01/2023    7:03 PM 02/20/2023    8:08 PM 02/13/2019    9:15 AM 01/13/2019    7:04 PM  Edinburgh Postnatal Depression Scale Screening Tool  I have been able to laugh and see the funny side of things. 2 2 0 0 --  I have looked forward with enjoyment to things. 1 2 0 0   I have blamed myself unnecessarily when things went wrong. 1 1 0 0   I have been anxious or worried for no good reason. 3 3 0 2   I have felt scared or panicky for no good reason. 3 2 0 2   Things have been getting on top of me. 2 2 0 0   I have been so unhappy that I have had difficulty sleeping. 2 1 0 0   I have felt sad or miserable. 1 2 0 0   I have been so unhappy that I have been crying. 2 2 0 0   The thought of harming myself has occurred to me. 0 0  0 0   Edinburgh Postnatal Depression Scale Total 17 17 0 4

## 2023-04-11 ENCOUNTER — Encounter: Payer: Self-pay | Admitting: Advanced Practice Midwife

## 2023-04-16 ENCOUNTER — Ambulatory Visit: Payer: Self-pay | Admitting: Clinical

## 2023-04-16 DIAGNOSIS — F4329 Adjustment disorder with other symptoms: Secondary | ICD-10-CM

## 2023-04-16 DIAGNOSIS — Z658 Other specified problems related to psychosocial circumstances: Secondary | ICD-10-CM

## 2023-04-16 NOTE — Patient Instructions (Signed)
 Center for [facility] at [facility] [address] [place], [place] [address] [phone] (main office) [phone] [facility] office)  New Parent Support Groups [email or web address]

## 2023-04-19 MED ORDER — SERTRALINE HCL 50 MG PO TABS: 50.0000 mg | ORAL_TABLET | Freq: Every day | ORAL | 3 refills | Status: DC

## 2023-04-30 ENCOUNTER — Other Ambulatory Visit: Payer: Self-pay

## 2023-04-30 DIAGNOSIS — B379 Candidiasis, unspecified: Secondary | ICD-10-CM

## 2023-04-30 MED ORDER — FLUCONAZOLE 150 MG PO TABS
150.0000 mg | ORAL_TABLET | Freq: Once | ORAL | 0 refills | Status: AC
Start: 2023-04-30 — End: 2023-04-30

## 2023-04-30 NOTE — Progress Notes (Signed)
 Infant dx with Thrush.  Rx sent for Diflucan for patient.  Sheila Kirby

## 2023-05-03 NOTE — BH Specialist Note (Unsigned)
 Pt did not arrive to video visit and did not answer the phone; Left HIPPA-compliant message to call back Carolyn Cisco from [employer] for [employer] at [facility] at  [phone] [facility] office).  ?; left MyChart message for patient.  ? ?

## 2023-05-09 ENCOUNTER — Ambulatory Visit: Payer: Self-pay | Admitting: Clinical

## 2023-05-09 DIAGNOSIS — Z91199 Patient's noncompliance with other medical treatment and regimen due to unspecified reason: Secondary | ICD-10-CM

## 2023-07-10 ENCOUNTER — Other Ambulatory Visit (HOSPITAL_BASED_OUTPATIENT_CLINIC_OR_DEPARTMENT_OTHER): Payer: Self-pay

## 2023-07-10 ENCOUNTER — Telehealth: Payer: Self-pay | Admitting: Family Medicine

## 2023-07-10 MED ORDER — FLUCONAZOLE 150 MG PO TABS
150.0000 mg | ORAL_TABLET | Freq: Once | ORAL | 0 refills | Status: AC
  Filled 2023-07-10: qty 1, 1d supply, fill #0

## 2023-07-10 MED ORDER — AMOXICILLIN 875 MG PO TABS
875.0000 mg | ORAL_TABLET | Freq: Two times a day (BID) | ORAL | 0 refills | Status: AC
  Filled 2023-07-10: qty 20, 10d supply, fill #0

## 2023-07-10 NOTE — Telephone Encounter (Signed)
 Patient called in - having fever, breast pain. Thinks she has mastitis. Amoxicillin  called in to pharmacy.

## 2023-10-30 ENCOUNTER — Encounter (HOSPITAL_BASED_OUTPATIENT_CLINIC_OR_DEPARTMENT_OTHER): Payer: Self-pay

## 2023-10-30 ENCOUNTER — Ambulatory Visit (HOSPITAL_BASED_OUTPATIENT_CLINIC_OR_DEPARTMENT_OTHER)
Admission: RE | Admit: 2023-10-30 | Discharge: 2023-10-30 | Disposition: A | Discharge status: Home / Self Care | Attending: Family Medicine | Admitting: Family Medicine

## 2023-10-30 ENCOUNTER — Other Ambulatory Visit (HOSPITAL_BASED_OUTPATIENT_CLINIC_OR_DEPARTMENT_OTHER): Payer: Self-pay

## 2023-10-30 VITALS — BP 117/67 | HR 101 | Temp 99.0°F | Resp 18

## 2023-10-30 DIAGNOSIS — H10021 Other mucopurulent conjunctivitis, right eye: Secondary | ICD-10-CM

## 2023-10-30 MED ORDER — POLYMYXIN B-TRIMETHOPRIM 10000-0.1 UNIT/ML-% OP SOLN
1.0000 [drp] | OPHTHALMIC | 0 refills | Status: AC
  Filled 2023-10-30: qty 10, 16d supply, fill #0

## 2023-10-30 NOTE — ED Provider Notes (Signed)
[facility]    CSN: [id number] Arrival date & time: 10/30/23  1258      History   Chief Complaint Chief Complaint  Patient presents with   Eye Problem    Pink eye - Entered by patient    HPI Sheila Kirby is a 28 y.o. female.   Pt c/o right eye redness, itchy, and she had discharge this morning she first noticed it yesterday. Pt thinks its going to her left eye also, pt reports her son had pink eye.     Eye Problem   Past Medical History:  Diagnosis Date   Abnormal uterine bleeding, postpartum 01/30/2019   Cardiac murmur 12/17/2018   Family history of spina bifida 11/15/2014   Sore throat 12/24/2018   Symptoms of upper respiratory infection (URI) 12/24/2018    There are no active problems to display for this patient.   Past Surgical History:  Procedure Laterality Date   DILATION AND EVACUATION N/A 01/30/2019   Procedure: DILATATION AND EVACUATION;  Surgeon: Eveline Lynwood MATSU, MD;  Location: [facility];  Service: Gynecology;  Laterality: N/A;   WISDOM TOOTH EXTRACTION      OB History     Gravida  4   Para  4   Term  4   Preterm  0   AB  0   Living  4      SAB  0   IAB  0   Ectopic  0   Multiple  0   Live Births  4            Home Medications    Prior to Admission medications   Medication Sig Start Date End Date Taking? Authorizing Provider  sertraline  (ZOLOFT ) 50 MG tablet Take 1 tablet (50 mg total) by mouth daily. 04/19/23  Yes Stinson, Jacob J, DO  trimethoprim -polymyxin b  (POLYTRIM ) ophthalmic solution Place 1 drop into both eyes every 4 (four) hours for 7 days. 10/30/23 11/15/23 Yes Hether Anselmo A, FNP  acetaminophen  (TYLENOL ) 325 MG tablet Take 2 tablets (650 mg total) by mouth every 4 (four) hours as needed (for pain scale < 4). 02/22/23   Wouk, Devaughn Sayres, MD  Prenatal Vit-Fe Fumarate-FA (PRENATAL MULTIVITAMIN) TABS tablet Take 1 tablet by mouth daily at 12 noon.    [provider]     Family History Family History  Problem Relation Age of Onset   Hypertension Mother    Thyroid  disease Mother    Diabetes Father    Hypertension Father    Cancer Sister        skin cancer   Cancer Maternal Grandfather        skin   Heart attack Paternal Grandmother    Cancer Paternal Grandfather        renal   Heart attack Maternal Aunt    Heart attack Maternal Aunt    Heart attack Maternal Aunt    Spina bifida Paternal Uncle    Stroke Neg Hx    Miscarriages / Stillbirths Neg Hx    Mental retardation Neg Hx     Social History Social History   Tobacco Use   Smoking status: Never   Smokeless tobacco: Never  Vaping Use   Vaping status: Never Used  Substance Use Topics   Alcohol use: Never   Drug use: Never     Allergies   Patient has no known allergies.   Review of Systems Review of Systems  See HPI Physical Exam Triage Vital  Signs ED Triage Vitals  Encounter Vitals Group     BP 10/30/23 1316 117/67     Girls Systolic BP Percentile --      Girls Diastolic BP Percentile --      Boys Systolic BP Percentile --      Boys Diastolic BP Percentile --      Pulse Rate 10/30/23 1316 (!) 101     Resp 10/30/23 1316 18     Temp 10/30/23 1316 99 F (37.2 C)     Temp Source 10/30/23 1316 Oral     SpO2 10/30/23 1316 97 %     Weight --      Height --      Head Circumference --      Peak Flow --      Pain Score 10/30/23 1315 0     Pain Loc --      Pain Education --      Exclude from Growth Chart --    No data found.  Updated Vital Signs BP 117/67 (BP Location: Right Arm)   Pulse (!) 101   Temp 99 F (37.2 C) (Oral)   Resp 18   LMP 10/05/2023   SpO2 97%   Breastfeeding No   Visual Acuity Right Eye Distance:   Left Eye Distance:   Bilateral Distance:    Right Eye Near:   Left Eye Near:    Bilateral Near:     Physical Exam Vitals and nursing note reviewed.  Constitutional:      General: She is not in acute distress.    Appearance: Normal  appearance. She is not ill-appearing, toxic-appearing or diaphoretic.  Eyes:     General:        Right eye: Discharge present.     Conjunctiva/sclera:     Right eye: Right conjunctiva is injected.  Pulmonary:     Effort: Pulmonary effort is normal.  Neurological:     Mental Status: She is alert.  Psychiatric:        Mood and Affect: Mood normal.      UC Treatments / Results  Labs (all labs ordered are listed, but only abnormal results are displayed) Labs Reviewed - No data to display  EKG   Radiology No results found.  Procedures Procedures (including critical care time)  Medications Ordered in UC Medications - No data to display  Initial Impression / Assessment and Plan / UC Course  I have reviewed the triage vital signs and the nursing notes.  Pertinent labs & imaging results that were available during my care of the patient were reviewed by me and considered in my medical decision making (see chart for details).     Pink eye of right eye- Treating for pink eye .  Use the drops as prescribed.  Keep the areas clean and change your pillowcases. Follow-up as needed Final Clinical Impressions(s) / UC Diagnoses   Final diagnoses:  Pink eye disease of right eye     Discharge Instructions      Treating you for pink eye .  Use the drops as prescribed.  Keep the areas clean and change your pillowcases. Follow-up as needed    ED Prescriptions     Medication Sig Dispense Auth. Provider   trimethoprim -polymyxin b  (POLYTRIM ) ophthalmic solution Place 1 drop into both eyes every 4 (four) hours for 7 days. 10 mL Adah Wilbert LABOR, FNP      PDMP not reviewed this encounter.   Adah Wilbert LABOR, FNP 10/30/23  1348  

## 2023-10-30 NOTE — Discharge Instructions (Addendum)
 Treating you for pink eye .  Use the drops as prescribed.  Keep the areas clean and change your pillowcases. Follow-up as needed

## 2023-10-30 NOTE — ED Triage Notes (Signed)
 Pt c/o right eye redness, itchy, and she had discharge this morning she first noticed it yesterday. Pt thinks its going to her left eye also, pt reports her son had pink eye.

## 2023-11-09 ENCOUNTER — Encounter (HOSPITAL_BASED_OUTPATIENT_CLINIC_OR_DEPARTMENT_OTHER): Payer: Self-pay

## 2023-11-09 ENCOUNTER — Ambulatory Visit (HOSPITAL_BASED_OUTPATIENT_CLINIC_OR_DEPARTMENT_OTHER)
Admission: RE | Admit: 2023-11-09 | Discharge: 2023-11-09 | Disposition: A | Discharge status: Home / Self Care | Payer: Self-pay | Attending: Family Medicine | Admitting: Family Medicine

## 2023-11-09 VITALS — BP 106/53 | HR 67 | Temp 98.2°F | Resp 19 | Ht 69.0 in | Wt 220.0 lb

## 2023-11-09 DIAGNOSIS — J0100 Acute maxillary sinusitis, unspecified: Secondary | ICD-10-CM | POA: Diagnosis not present

## 2023-11-09 LAB — POCT RAPID STREP A (OFFICE): Rapid Strep A Screen: NEGATIVE

## 2023-11-09 MED ORDER — AMOXICILLIN 875 MG PO TABS: 875.0000 mg | ORAL_TABLET | Freq: Two times a day (BID) | ORAL | 0 refills | Status: AC

## 2023-11-09 NOTE — Discharge Instructions (Signed)
 Start Mucinex OTC.  Take the antibiotics as prescribed.  Drink plenty of fluids.

## 2023-11-09 NOTE — ED Provider Notes (Signed)
 [facility]    CSN: [id number] Arrival date & time: 11/09/23  0951      History   Chief Complaint Chief Complaint  Patient presents with   Sore Throat    Strep - Entered by patient    HPI Sheila Kirby is a 28 y.o. female.   Patient is a 28 year old female presents today with approximately 2 weeks or more of nasal congestion, rhinorrhea, cough, sinus pressure, sore throat.  Symptoms are not improving.  They are only worsening.  Was recently treated for pinkeye.  This has resolved.  Denies any fevers, chills.   Sore Throat    Past Medical History:  Diagnosis Date   Abnormal uterine bleeding, postpartum 01/30/2019   Cardiac murmur 12/17/2018   Family history of spina bifida 11/15/2014   Sore throat 12/24/2018   Symptoms of upper respiratory infection (URI) 12/24/2018    There are no active problems to display for this patient.   Past Surgical History:  Procedure Laterality Date   DILATION AND EVACUATION N/A 01/30/2019   Procedure: DILATATION AND EVACUATION;  Surgeon: Eveline Lynwood MATSU, MD;  Location: [facility];  Service: Gynecology;  Laterality: N/A;   WISDOM TOOTH EXTRACTION      OB History     Gravida  4   Para  4   Term  4   Preterm  0   AB  0   Living  4      SAB  0   IAB  0   Ectopic  0   Multiple  0   Live Births  4            Home Medications    Prior to Admission medications   Medication Sig Start Date End Date Taking? Authorizing Provider  acetaminophen  (TYLENOL ) 325 MG tablet Take 2 tablets (650 mg total) by mouth every 4 (four) hours as needed (for pain scale < 4). 02/22/23  Yes Wouk, Devaughn Sayres, MD  amoxicillin  (AMOXIL ) 875 MG tablet Take 1 tablet (875 mg total) by mouth 2 (two) times daily for 7 days. 11/09/23 11/16/23 Yes Abbi Mancini, Wilbert LABOR, FNP  Prenatal Vit-Fe Fumarate-FA (PRENATAL MULTIVITAMIN) TABS tablet Take 1 tablet by mouth daily at 12 noon.   Yes [provider]  sertraline   (ZOLOFT ) 50 MG tablet Take 1 tablet (50 mg total) by mouth daily. 04/19/23  Yes Stinson, Jacob J, DO  trimethoprim -polymyxin b  (POLYTRIM ) ophthalmic solution Place 1 drop into both eyes every 4 (four) hours for 7 days. 10/30/23 11/15/23 Yes Adah Wilbert LABOR, FNP    Family History Family History  Problem Relation Age of Onset   Hypertension Mother    Thyroid  disease Mother    Diabetes Father    Hypertension Father    Cancer Sister        skin cancer   Cancer Maternal Grandfather        skin   Heart attack Paternal Grandmother    Cancer Paternal Grandfather        renal   Heart attack Maternal Aunt    Heart attack Maternal Aunt    Heart attack Maternal Aunt    Spina bifida Paternal Uncle    Stroke Neg Hx    Miscarriages / Stillbirths Neg Hx    Mental retardation Neg Hx     Social History Social History   Tobacco Use   Smoking status: Never   Smokeless tobacco: Never  Vaping Use   Vaping status: Never Used  Substance Use Topics   Alcohol use: Never   Drug use: Never     Allergies   Patient has no known allergies.   Review of Systems Review of Systems See HPI  Physical Exam Triage Vital Signs ED Triage Vitals  Encounter Vitals Group     BP 11/09/23 1005 (!) 106/53     Girls Systolic BP Percentile --      Girls Diastolic BP Percentile --      Boys Systolic BP Percentile --      Boys Diastolic BP Percentile --      Pulse Rate 11/09/23 1005 67     Resp 11/09/23 1005 19     Temp 11/09/23 1005 98.2 F (36.8 C)     Temp Source 11/09/23 1005 Oral     SpO2 11/09/23 1005 95 %     Weight 11/09/23 1003 220 lb (99.8 kg)     Height 11/09/23 1003 5' 9 (1.753 m)     Head Circumference --      Peak Flow --      Pain Score 11/09/23 1003 7     Pain Loc --      Pain Education --      Exclude from Growth Chart --    No data found.  Updated Vital Signs BP (!) 106/53 (BP Location: Right Arm)   Pulse 67   Temp 98.2 F (36.8 C) (Oral)   Resp 19   Ht 5' 9 (1.753 m)    Wt 220 lb (99.8 kg)   LMP 11/03/2023   SpO2 95%   BMI 32.49 kg/m   Visual Acuity Right Eye Distance:   Left Eye Distance:   Bilateral Distance:    Right Eye Near:   Left Eye Near:    Bilateral Near:     Physical Exam Constitutional:      General: She is not in acute distress.    Appearance: Normal appearance. She is not ill-appearing, toxic-appearing or diaphoretic.  HENT:     Head: Normocephalic and atraumatic.     Right Ear: Tympanic membrane and ear canal normal.     Left Ear: Tympanic membrane and ear canal normal.     Nose: Congestion and rhinorrhea present.     Mouth/Throat:     Pharynx: Oropharynx is clear.  Eyes:     Conjunctiva/sclera: Conjunctivae normal.  Cardiovascular:     Rate and Rhythm: Normal rate and regular rhythm.     Pulses: Normal pulses.     Heart sounds: Normal heart sounds.  Pulmonary:     Effort: Pulmonary effort is normal.     Breath sounds: Normal breath sounds.  Skin:    General: Skin is warm and dry.  Neurological:     Mental Status: She is alert.  Psychiatric:        Mood and Affect: Mood normal.      UC Treatments / Results  Labs (all labs ordered are listed, but only abnormal results are displayed) Labs Reviewed  POCT RAPID STREP A (OFFICE) - Normal    EKG   Radiology No results found.  Procedures Procedures (including critical care time)  Medications Ordered in UC Medications - No data to display  Initial Impression / Assessment and Plan / UC Course  I have reviewed the triage vital signs and the nursing notes.  Pertinent labs & imaging results that were available during my care of the patient were reviewed by me and considered in my medical decision making (see  chart for details).     Acute sinusitis-treating for bacterial sinus infection at this time with amoxicillin .  Recommend Mucinex over-the-counter. Follow-up as needed Final Clinical Impressions(s) / UC Diagnoses   Final diagnoses:  Acute  non-recurrent maxillary sinusitis     Discharge Instructions      Start Mucinex OTC.  Take the antibiotics as prescribed.  Drink plenty of fluids.    ED Prescriptions     Medication Sig Dispense Auth. Provider   amoxicillin  (AMOXIL ) 875 MG tablet Take 1 tablet (875 mg total) by mouth 2 (two) times daily for 7 days. 14 tablet Adah Wilbert LABOR, FNP      PDMP not reviewed this encounter.   Adah Wilbert LABOR, FNP 11/09/23 1028

## 2023-11-09 NOTE — ED Triage Notes (Signed)
 Pt states that she has a sore throat, facial pain, bilateral ear pain and fullness. X1 week  Pt states she was recently seen for the sore throat and left eye redness.

## 2023-12-11 ENCOUNTER — Encounter (HOSPITAL_BASED_OUTPATIENT_CLINIC_OR_DEPARTMENT_OTHER): Payer: Self-pay

## 2023-12-11 ENCOUNTER — Other Ambulatory Visit (HOSPITAL_BASED_OUTPATIENT_CLINIC_OR_DEPARTMENT_OTHER): Payer: Self-pay

## 2023-12-11 ENCOUNTER — Ambulatory Visit (HOSPITAL_BASED_OUTPATIENT_CLINIC_OR_DEPARTMENT_OTHER)
Admission: RE | Admit: 2023-12-11 | Discharge: 2023-12-11 | Disposition: A | Discharge status: Home / Self Care | Attending: Family Medicine

## 2023-12-11 VITALS — BP 116/79 | HR 93 | Temp 98.2°F | Resp 16 | Ht 69.0 in | Wt 220.0 lb

## 2023-12-11 DIAGNOSIS — J0110 Acute frontal sinusitis, unspecified: Secondary | ICD-10-CM

## 2023-12-11 MED ORDER — DEXAMETHASONE SOD PHOSPHATE PF 10 MG/ML IJ SOLN
10.0000 mg | Freq: Once | INTRAMUSCULAR | Status: AC
  Administered 2023-12-11: 10 mg via INTRAMUSCULAR

## 2023-12-11 MED ORDER — AMOXICILLIN 875 MG PO TABS
875.0000 mg | ORAL_TABLET | Freq: Two times a day (BID) | ORAL | 0 refills | Status: AC
  Filled 2023-12-11: qty 14, 7d supply, fill #0

## 2023-12-11 NOTE — ED Triage Notes (Signed)
 Pt st's she thinks she has a sinus infection.  St's symptoms are the same as they were when she had one in the past.  C/o nasal drainage, sore throat and bil ear pain

## 2023-12-11 NOTE — ED Provider Notes (Signed)
 [facility]    CSN: [id number] Arrival date & time: 12/11/23  1357      History   Chief Complaint Chief Complaint  Patient presents with   Ear Fullness    Sore throat, drainage - Entered by patient    HPI Sheila Kirby is a 28 y.o. female.   Patient is a 28 year old female that presents today with approximately 3 to 4 days of sinus inflammation, ear pain and eye irritation.  Pt sts she thinks she has a sinus infection.  Sts symptoms are the same as they were when she had one in the past.  She was seen here a few weeks ago and treated and improved with Amoxicillin . Her daughter has also been sick.    Ear Fullness    Past Medical History:  Diagnosis Date   Abnormal uterine bleeding, postpartum 01/30/2019   Cardiac murmur 12/17/2018   Family history of spina bifida 11/15/2014   Sore throat 12/24/2018   Symptoms of upper respiratory infection (URI) 12/24/2018    There are no active problems to display for this patient.   Past Surgical History:  Procedure Laterality Date   DILATION AND EVACUATION N/A 01/30/2019   Procedure: DILATATION AND EVACUATION;  Surgeon: Eveline Lynwood MATSU, MD;  Location: [facility];  Service: Gynecology;  Laterality: N/A;   WISDOM TOOTH EXTRACTION      OB History     Gravida  4   Para  4   Term  4   Preterm  0   AB  0   Living  4      SAB  0   IAB  0   Ectopic  0   Multiple  0   Live Births  4            Home Medications    Prior to Admission medications   Medication Sig Start Date End Date Taking? Authorizing Provider  amoxicillin  (AMOXIL ) 875 MG tablet Take 1 tablet (875 mg total) by mouth 2 (two) times daily for 7 days. 12/11/23 12/18/23 Yes Najib Colmenares A, FNP  acetaminophen  (TYLENOL ) 325 MG tablet Take 2 tablets (650 mg total) by mouth every 4 (four) hours as needed (for pain scale < 4). Patient not taking: Reported on 12/11/2023 02/22/23   Wouk, Devaughn Sayres, MD  Prenatal Vit-Fe  Fumarate-FA (PRENATAL MULTIVITAMIN) TABS tablet Take 1 tablet by mouth daily at 12 noon. Patient not taking: Reported on 12/11/2023    [provider]  sertraline  (ZOLOFT ) 50 MG tablet Take 1 tablet (50 mg total) by mouth daily. 04/19/23   Barbra Lang PARAS, DO    Family History Family History  Problem Relation Age of Onset   Hypertension Mother    Thyroid  disease Mother    Diabetes Father    Hypertension Father    Cancer Sister        skin cancer   Cancer Maternal Grandfather        skin   Heart attack Paternal Grandmother    Cancer Paternal Grandfather        renal   Heart attack Maternal Aunt    Heart attack Maternal Aunt    Heart attack Maternal Aunt    Spina bifida Paternal Uncle    Stroke Neg Hx    Miscarriages / Stillbirths Neg Hx    Mental retardation Neg Hx     Social History Social History   Tobacco Use   Smoking status: Never   Smokeless tobacco:  Never  Vaping Use   Vaping status: Never Used  Substance Use Topics   Alcohol use: Never   Drug use: Never     Allergies   Patient has no known allergies.   Review of Systems Review of Systems See HPI  Physical Exam Triage Vital Signs ED Triage Vitals  Encounter Vitals Group     BP 12/11/23 1423 116/79     Girls Systolic BP Percentile --      Girls Diastolic BP Percentile --      Boys Systolic BP Percentile --      Boys Diastolic BP Percentile --      Pulse Rate 12/11/23 1423 93     Resp 12/11/23 1423 16     Temp 12/11/23 1423 98.2 F (36.8 C)     Temp Source 12/11/23 1423 Oral     SpO2 12/11/23 1423 96 %     Weight 12/11/23 1425 220 lb (99.8 kg)     Height 12/11/23 1425 5' 9 (1.753 m)     Head Circumference --      Peak Flow --      Pain Score 12/11/23 1425 5     Pain Loc --      Pain Education --      Exclude from Growth Chart --    No data found.  Updated Vital Signs BP 116/79 (BP Location: Right Arm)   Pulse 93   Temp 98.2 F (36.8 C) (Oral)   Resp 16   Ht 5' 9 (1.753  m)   Wt 220 lb (99.8 kg)   LMP 12/11/2023 (Exact Date)   SpO2 96%   BMI 32.49 kg/m   Visual Acuity Right Eye Distance:   Left Eye Distance:   Bilateral Distance:    Right Eye Near:   Left Eye Near:    Bilateral Near:     Physical Exam Vitals and nursing note reviewed.  Constitutional:      General: She is not in acute distress.    Appearance: Normal appearance. She is not ill-appearing, toxic-appearing or diaphoretic.  HENT:     Right Ear: Tympanic membrane, ear canal and external ear normal.     Left Ear: Tympanic membrane, ear canal and external ear normal.     Nose: Congestion present.     Mouth/Throat:     Pharynx: Oropharynx is clear.  Eyes:     Conjunctiva/sclera: Conjunctivae normal.  Pulmonary:     Effort: Pulmonary effort is normal.  Neurological:     Mental Status: She is alert.  Psychiatric:        Mood and Affect: Mood normal.      UC Treatments / Results  Labs (all labs ordered are listed, but only abnormal results are displayed) Labs Reviewed - No data to display  EKG   Radiology No results found.  Procedures Procedures (including critical care time)  Medications Ordered in UC Medications  dexamethasone  (DECADRON ) injection 10 mg (10 mg Intramuscular Given 12/11/23 1456)    Initial Impression / Assessment and Plan / UC Course  I have reviewed the triage vital signs and the nursing notes.  Pertinent labs & imaging results that were available during my care of the patient were reviewed by me and considered in my medical decision making (see chart for details).     Acute sinusitis-this is most likely viral.  She has some concern of it developing into a bacterial sinus infection.  Steroid injection given here today for sinus inflammation.  I did send in amoxicillin  but recommended waiting a few more days to see if the problem worsens or improves.  Patient agrees to this.  She can also do over-the-counter medication as needed.  Follow-up as  needed Final Clinical Impressions(s) / UC Diagnoses   Final diagnoses:  Acute non-recurrent frontal sinusitis     Discharge Instructions      Steroid injection given here today for sinus inflammation.  If you are not feeling better over the next couple of days you can go ahead and start the antibiotics.  Over-the-counter doses as needed.  Follow-up as needed   ED Prescriptions     Medication Sig Dispense Auth. Provider   amoxicillin  (AMOXIL ) 875 MG tablet Take 1 tablet (875 mg total) by mouth 2 (two) times daily for 7 days. 14 tablet Adah Wilbert LABOR, FNP      PDMP not reviewed this encounter.   Adah Wilbert LABOR, FNP 12/11/23 1521

## 2023-12-11 NOTE — Discharge Instructions (Addendum)
 Steroid injection given here today for sinus inflammation.  If you are not feeling better over the next couple of days you can go ahead and start the antibiotics.  Over-the-counter doses as needed.  Follow-up as needed

## 2024-01-01 ENCOUNTER — Other Ambulatory Visit (HOSPITAL_BASED_OUTPATIENT_CLINIC_OR_DEPARTMENT_OTHER): Payer: Self-pay

## 2024-01-05 ENCOUNTER — Other Ambulatory Visit: Payer: Self-pay | Admitting: Family Medicine
